# Patient Record
Sex: Female | Born: 1970
Health system: Southern US, Community
[De-identification: ages and names within clinical notes are randomized; demographics above are authoritative.]

## PROBLEM LIST (undated history)

## (undated) DIAGNOSIS — Z8679 Personal history of other diseases of the circulatory system: Secondary | ICD-10-CM

## (undated) DIAGNOSIS — B181 Chronic viral hepatitis B without delta-agent: Secondary | ICD-10-CM

## (undated) DIAGNOSIS — Z8709 Personal history of other diseases of the respiratory system: Secondary | ICD-10-CM

## (undated) DIAGNOSIS — S7292XA Unspecified fracture of left femur, initial encounter for closed fracture: Secondary | ICD-10-CM

## (undated) DIAGNOSIS — I351 Nonrheumatic aortic (valve) insufficiency: Secondary | ICD-10-CM

## (undated) HISTORY — DX: Personal history of other diseases of the circulatory system: Z86.79

## (undated) HISTORY — DX: Nonrheumatic aortic (valve) insufficiency: I35.1

## (undated) HISTORY — DX: Chronic viral hepatitis B without delta-agent: B18.1

## (undated) HISTORY — DX: Personal history of other diseases of the respiratory system: Z87.09

## (undated) HISTORY — DX: Unspecified fracture of left femur, initial encounter for closed fracture: S72.92XA

## (undated) HISTORY — PX: HEMORRHOID SURGERY: SHX153

---
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
## 1998-01-05 ENCOUNTER — Other Ambulatory Visit: Admission: RE | Admit: 1998-01-05 | Discharge: 1998-01-05 | Payer: Self-pay | Admitting: Obstetrics and Gynecology

## 1998-01-18 ENCOUNTER — Other Ambulatory Visit: Admission: RE | Admit: 1998-01-18 | Discharge: 1998-01-18 | Payer: Self-pay | Admitting: Obstetrics and Gynecology

## 1998-08-04 ENCOUNTER — Inpatient Hospital Stay (HOSPITAL_COMMUNITY): Admission: AD | Admit: 1998-08-04 | Discharge: 1998-08-06 | Payer: Self-pay | Admitting: *Deleted

## 1998-09-14 ENCOUNTER — Other Ambulatory Visit: Admission: RE | Admit: 1998-09-14 | Discharge: 1998-09-14 | Payer: Self-pay | Admitting: Obstetrics & Gynecology

## 1999-12-30 ENCOUNTER — Other Ambulatory Visit: Admission: RE | Admit: 1999-12-30 | Discharge: 1999-12-30 | Payer: Self-pay | Admitting: Obstetrics & Gynecology

## 2002-06-26 ENCOUNTER — Inpatient Hospital Stay (HOSPITAL_COMMUNITY): Admission: AD | Admit: 2002-06-26 | Discharge: 2002-06-28 | Payer: Self-pay | Admitting: Obstetrics and Gynecology

## 2002-06-26 ENCOUNTER — Inpatient Hospital Stay (HOSPITAL_COMMUNITY): Admission: AD | Admit: 2002-06-26 | Discharge: 2002-06-26 | Payer: Self-pay | Admitting: Obstetrics and Gynecology

## 2002-08-03 ENCOUNTER — Other Ambulatory Visit: Admission: RE | Admit: 2002-08-03 | Discharge: 2002-08-03 | Payer: Self-pay | Admitting: Obstetrics and Gynecology

## 2002-11-14 ENCOUNTER — Encounter (INDEPENDENT_AMBULATORY_CARE_PROVIDER_SITE_OTHER): Payer: Self-pay | Admitting: Specialist

## 2002-11-14 ENCOUNTER — Ambulatory Visit (HOSPITAL_BASED_OUTPATIENT_CLINIC_OR_DEPARTMENT_OTHER): Admission: RE | Admit: 2002-11-14 | Discharge: 2002-11-14 | Payer: Self-pay | Admitting: General Surgery

## 2002-11-15 ENCOUNTER — Emergency Department (HOSPITAL_COMMUNITY): Admission: EM | Admit: 2002-11-15 | Discharge: 2002-11-15 | Payer: Self-pay | Admitting: Emergency Medicine

## 2003-08-07 ENCOUNTER — Other Ambulatory Visit: Admission: RE | Admit: 2003-08-07 | Discharge: 2003-08-07 | Payer: Self-pay | Admitting: Obstetrics and Gynecology

## 2004-05-10 ENCOUNTER — Other Ambulatory Visit: Admission: RE | Admit: 2004-05-10 | Discharge: 2004-05-10 | Payer: Self-pay | Admitting: Obstetrics and Gynecology

## 2004-09-01 DIAGNOSIS — I351 Nonrheumatic aortic (valve) insufficiency: Secondary | ICD-10-CM

## 2004-09-01 HISTORY — DX: Nonrheumatic aortic (valve) insufficiency: I35.1

## 2004-11-15 ENCOUNTER — Inpatient Hospital Stay (HOSPITAL_BASED_OUTPATIENT_CLINIC_OR_DEPARTMENT_OTHER): Admission: RE | Admit: 2004-11-15 | Discharge: 2004-11-15 | Payer: Self-pay | Admitting: Cardiology

## 2004-11-15 HISTORY — PX: CARDIAC CATHETERIZATION: SHX172

## 2004-11-25 ENCOUNTER — Emergency Department (HOSPITAL_COMMUNITY): Admission: EM | Admit: 2004-11-25 | Discharge: 2004-11-26 | Payer: Self-pay | Admitting: Emergency Medicine

## 2004-11-27 ENCOUNTER — Ambulatory Visit (HOSPITAL_COMMUNITY): Admission: RE | Admit: 2004-11-27 | Discharge: 2004-11-27 | Payer: Self-pay | Admitting: Cardiology

## 2004-11-30 HISTORY — PX: AORTIC VALVE REPLACEMENT: SHX41

## 2004-12-20 ENCOUNTER — Encounter (INDEPENDENT_AMBULATORY_CARE_PROVIDER_SITE_OTHER): Payer: Self-pay | Admitting: *Deleted

## 2004-12-20 ENCOUNTER — Inpatient Hospital Stay (HOSPITAL_COMMUNITY): Admission: RE | Admit: 2004-12-20 | Discharge: 2004-12-25 | Payer: Self-pay | Admitting: Cardiothoracic Surgery

## 2005-05-22 ENCOUNTER — Encounter (HOSPITAL_COMMUNITY): Admission: RE | Admit: 2005-05-22 | Discharge: 2005-08-20 | Payer: Self-pay | Admitting: Cardiology

## 2005-11-20 IMAGING — CR DG CHEST 2V
2 series · 2 of 2 positions shown · non-contrast
Comparison: none

CLINICAL DATA: Pre-op for aortic valve replacement. 
 CHEST - 2 VIEW: 
 Two views of the chest are compared to a chest x-ray of 11/26/04.  
 The lungs are clear.   The heart is mildly enlarged and stable.  No bony abnormality is seen.

[view not recorded (1 of 2)]
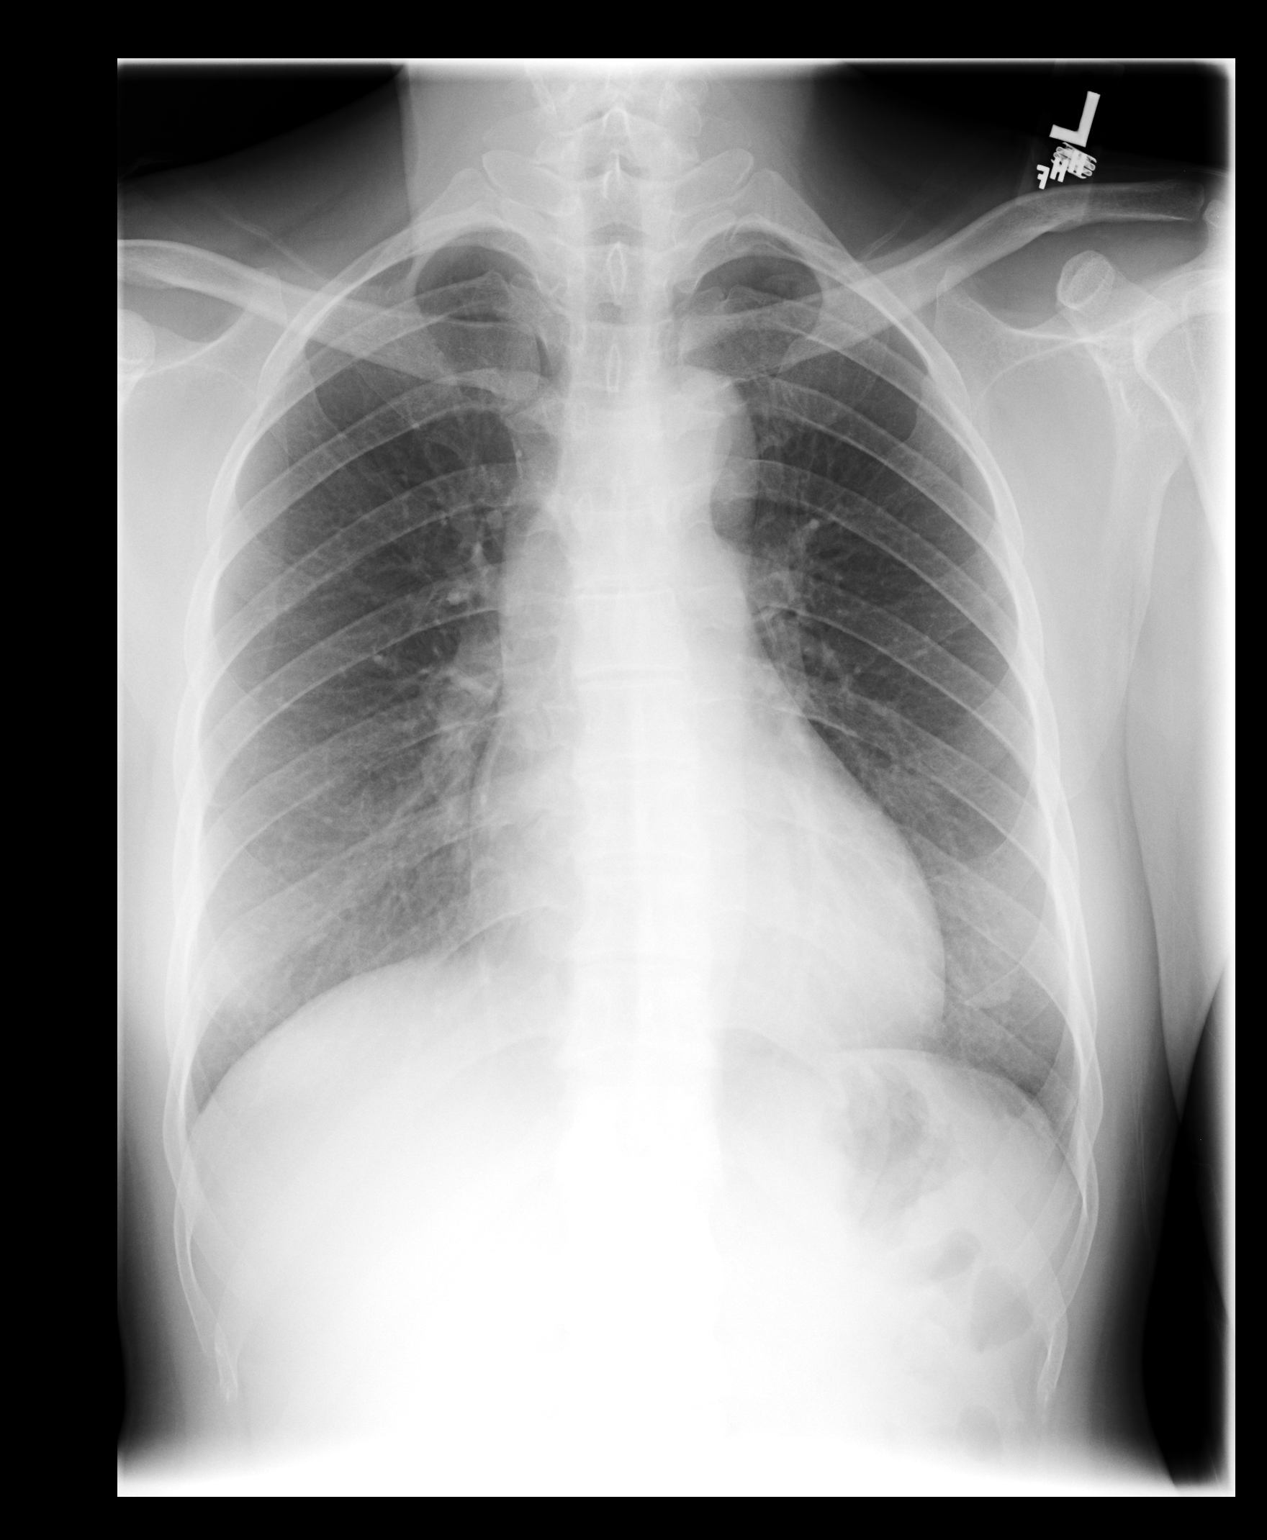

[view not recorded (2 of 2)]
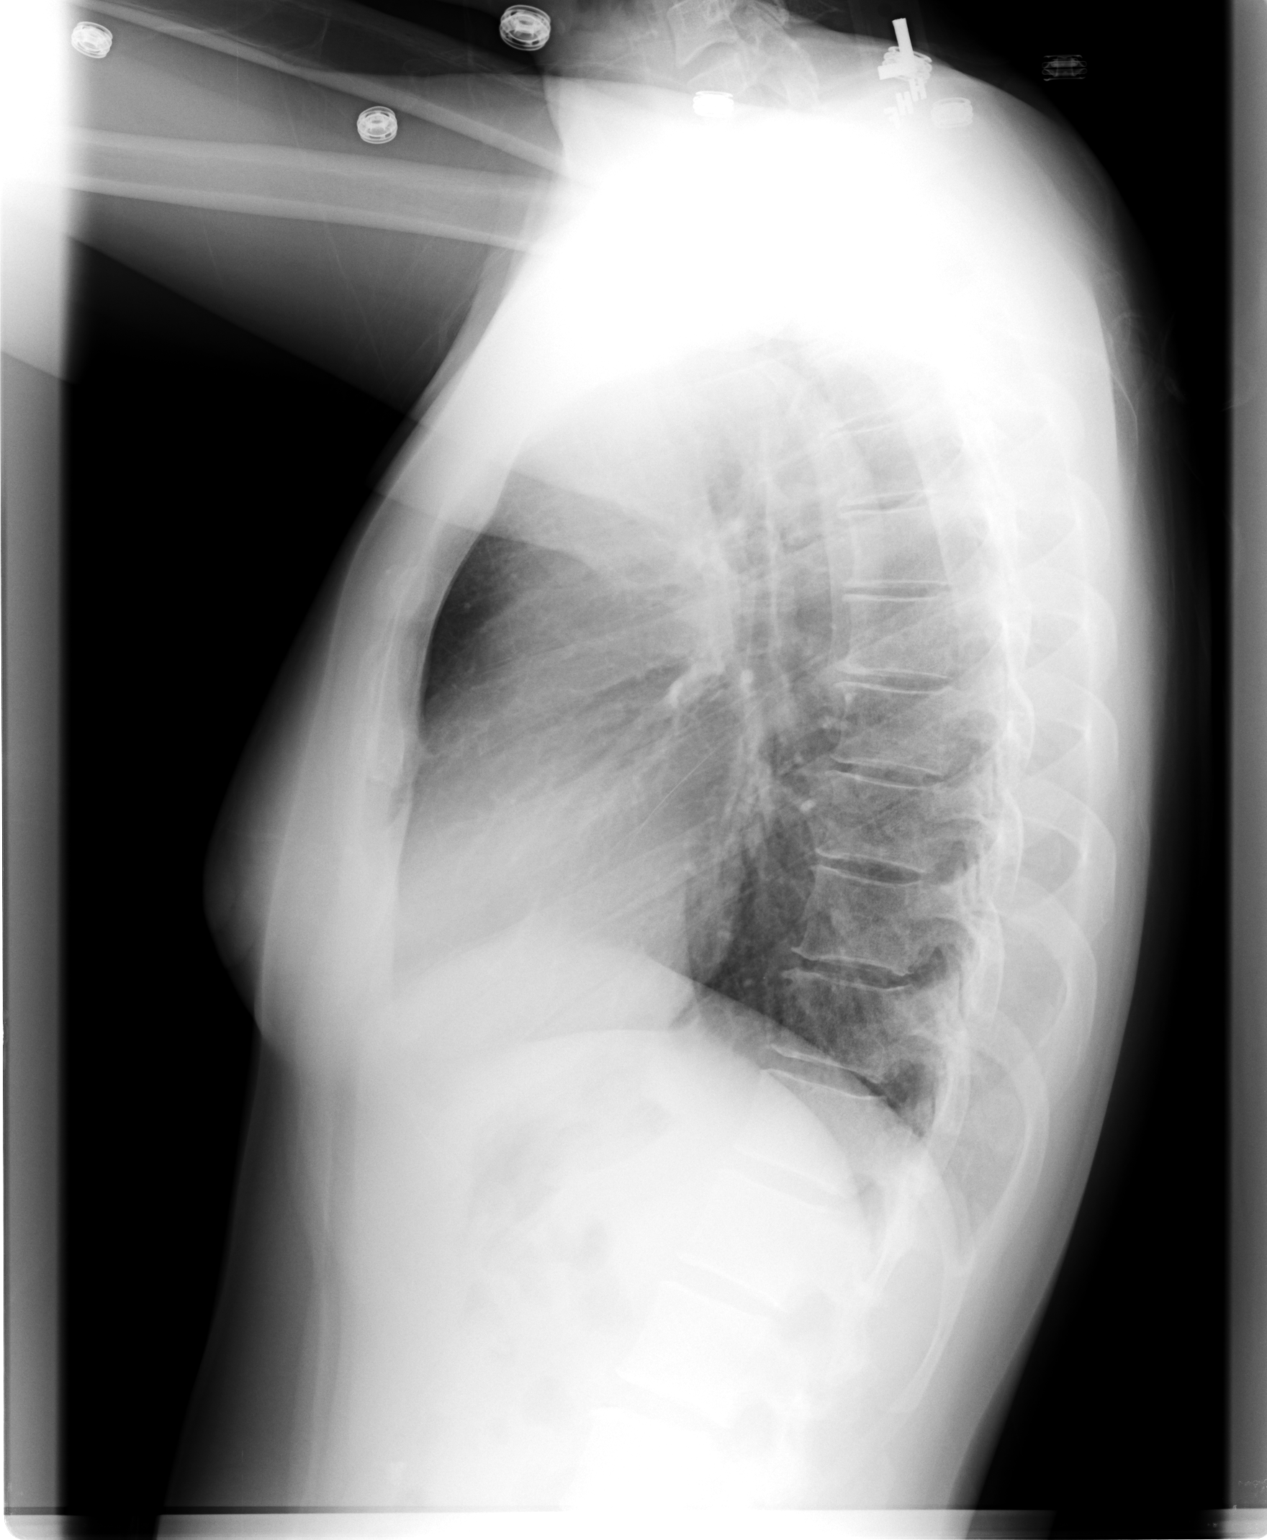

[2 of 2 positions shown; findings below may reference images not displayed]

IMPRESSION: No active lung disease.  Stable mild cardiomegaly.

## 2009-04-19 ENCOUNTER — Ambulatory Visit: Payer: Self-pay | Admitting: Gastroenterology

## 2009-05-15 ENCOUNTER — Ambulatory Visit: Payer: Self-pay | Admitting: Gastroenterology

## 2009-06-19 ENCOUNTER — Ambulatory Visit (HOSPITAL_COMMUNITY): Admission: RE | Admit: 2009-06-19 | Discharge: 2009-06-19 | Payer: Self-pay | Admitting: Gastroenterology

## 2009-09-13 ENCOUNTER — Ambulatory Visit: Payer: Self-pay | Admitting: Gastroenterology

## 2009-10-09 ENCOUNTER — Encounter: Payer: Self-pay | Admitting: Pulmonary Disease

## 2009-12-06 ENCOUNTER — Ambulatory Visit: Payer: Self-pay | Admitting: Gastroenterology

## 2009-12-20 ENCOUNTER — Encounter: Admission: RE | Admit: 2009-12-20 | Discharge: 2009-12-20 | Payer: Self-pay | Admitting: Cardiology

## 2009-12-26 ENCOUNTER — Encounter: Payer: Self-pay | Admitting: Pulmonary Disease

## 2010-01-09 ENCOUNTER — Encounter: Payer: Self-pay | Admitting: Pulmonary Disease

## 2010-01-14 ENCOUNTER — Encounter: Admission: RE | Admit: 2010-01-14 | Discharge: 2010-01-14 | Payer: Self-pay | Admitting: Family Medicine

## 2010-01-18 ENCOUNTER — Encounter: Admission: RE | Admit: 2010-01-18 | Discharge: 2010-01-18 | Payer: Self-pay | Admitting: Cardiology

## 2010-01-31 ENCOUNTER — Ambulatory Visit: Payer: Self-pay | Admitting: Pulmonary Disease

## 2010-01-31 DIAGNOSIS — R0602 Shortness of breath: Secondary | ICD-10-CM | POA: Insufficient documentation

## 2010-01-31 DIAGNOSIS — J984 Other disorders of lung: Secondary | ICD-10-CM | POA: Insufficient documentation

## 2010-02-20 ENCOUNTER — Ambulatory Visit: Payer: Self-pay | Admitting: Pulmonary Disease

## 2010-02-26 ENCOUNTER — Encounter: Payer: Self-pay | Admitting: Pulmonary Disease

## 2010-03-05 ENCOUNTER — Telehealth: Payer: Self-pay | Admitting: Pulmonary Disease

## 2010-03-21 ENCOUNTER — Ambulatory Visit: Payer: Self-pay | Admitting: Gastroenterology

## 2010-03-25 ENCOUNTER — Ambulatory Visit: Payer: Self-pay | Admitting: Pulmonary Disease

## 2010-04-09 ENCOUNTER — Ambulatory Visit: Payer: Self-pay | Admitting: Cardiology

## 2010-04-17 ENCOUNTER — Encounter: Payer: Self-pay | Admitting: Pulmonary Disease

## 2010-05-07 ENCOUNTER — Ambulatory Visit: Payer: Self-pay | Admitting: Cardiology

## 2010-05-28 ENCOUNTER — Ambulatory Visit: Payer: Self-pay | Admitting: Pulmonary Disease

## 2010-05-30 ENCOUNTER — Ambulatory Visit: Payer: Self-pay | Admitting: Cardiology

## 2010-06-04 ENCOUNTER — Ambulatory Visit (HOSPITAL_COMMUNITY): Admission: RE | Admit: 2010-06-04 | Discharge: 2010-06-04 | Payer: Self-pay | Admitting: Gastroenterology

## 2010-07-02 ENCOUNTER — Ambulatory Visit: Payer: Self-pay | Admitting: Cardiology

## 2010-07-04 ENCOUNTER — Ambulatory Visit: Payer: Self-pay | Admitting: Gastroenterology

## 2010-08-06 ENCOUNTER — Ambulatory Visit: Payer: Self-pay | Admitting: Cardiovascular Disease

## 2010-08-28 ENCOUNTER — Ambulatory Visit: Payer: Self-pay | Admitting: Cardiology

## 2010-09-26 ENCOUNTER — Ambulatory Visit: Payer: Self-pay | Admitting: Cardiology

## 2010-10-03 NOTE — Letter (Signed)
Summary: Bubble Study/[facility]  Bubble Study/[facility]   Imported By: Sherian Rein 06/03/2010 08:40:20  _____________________________________________________________________  External Attachment:    Type:   Image     Comment:   External Document

## 2010-10-03 NOTE — Assessment & Plan Note (Signed)
Summary: f/u ///kp   Visit Type:  Follow-up Copy to:  Dr. Warner Mccreedy, Dr. Peter [place], Dr. Sheliah Plane Primary Provider/Referring Provider:  Dr. Warner Mccreedy  CC:  Dyspnea follow-up...the patient says her breathing has improved.  History of Present Illness: 40 yo female with dyspnea.  Her breathing has been doing okay.  She will get winded if she walks up hills or goes above level 4 on her treadmill.  If she walks at a steady pace she has no problems.   She denies cough, wheeze, sputum, hemoptysis, chest pain, palpitations, fever, rashes, leg swelling, gland swelling, or abdominal symptoms.  Current Medications (verified): 1)  Coumadin 4 Mg Tabs (Warfarin Sodium) .... As Directed  Allergies (verified): No Known Drug Allergies  Past History:  Past Medical History: RPR positive Hepatitis B carrier Rheumatic fever Aortic insufficiency s/p valve replacement 2006 Pulmonary nodule Dyspnea      - Echo 10/09/09>>nl LV fx      - CT chest 01/18/10>>no PE, ? anterior thoracic aorta aneurysm      - Doppler legs 01/14/10>>no DVT      - PFT 02/20/10>>FEV1 2.70(95%), FEV1% 86, TLC 4.53(89%), DLCO 55%, no BD       - Transcranial doppler 04/17/10 negative for right to left shunt    Past Surgical History: Reviewed history from 01/31/2010 and no changes required. Left femur fracture 1991 Hemorrhoidectomy 2004 Cardiac catheterization      - Dr. Peter Swaziland March 2006 Aortic valve replacement with an On-X mechanical valve, serial #[id number], 21 mm.      - Dr. Sheliah Plane April 2006  Vital Signs:  Patient profile:   40 year old female Height:      64 inches (162.56 cm) Weight:      132 pounds (60.00 kg) BMI:     22.74 O2 Sat:      99 % on Room air Temp:     98.2 degrees F (36.78 degrees C) oral Pulse rate:   86 / minute BP sitting:   104 / 68  (left arm) Cuff size:   regular  Vitals Entered By: Michel Bickers CMA (May 28, 2010 2:52 PM)  O2 Sat at Rest %:   99 O2 Flow:  Room air CC: Dyspnea follow-up...the patient says her breathing has improved Comments Medications reviewed with patient Daytime phone verified. Michel Bickers [facility]  May 28, 2010 2:57 PM   Physical Exam  General:  normal appearance, healthy appearing, and thin.   Nose:  no deformity, discharge, inflammation, or lesions Mouth:  MP 3, triangular uvula, no exudate Neck:  no masses, thyromegaly, or abnormal cervical nodes Chest Wall:  no deformities noted Lungs:  clear bilaterally to auscultation and percussion Heart:  regular rhythm and normal rate with 3/6 systolic click Abdomen:  bowel sounds positive; abdomen soft and non-tender without masses, or organomegaly Msk:  no deformity or scoliosis noted with normal posture Pulses:  pulses normal Extremities:  no clubbing, cyanosis, edema, or deformity noted Neurologic:  normal CN II-XII and strength normal.   Skin:  intact without lesions or rashes Cervical Nodes:  no significant adenopathy   Impression & Recommendations:  Problem # 1:  DYSPNEA (ICD-786.05)  She has undergone an extension pulmonary evaluation.  Excluding isolated diffusion defect, her pulmonary evaluation has been unremarkable.  I have offered doing a cardiopulmonary stress test, but she does not feel her symptoms are too bad at present.  Will monitor clinically for now.  Advised her to call if she  develops any new symptoms.  Otherwise she is to follow up in 6 months.  Problem # 2:  PULMONARY NODULE (ICD-518.89)  Will need f/u CT chest in May 2012.  Complete Medication List: 1)  Coumadin 4 Mg Tabs (Warfarin sodium) .... As directed  Other Orders: Est. Patient Level III (04540)  Patient Instructions: 1)  Follow up in 6 months

## 2010-10-03 NOTE — Progress Notes (Signed)
  Phone Note Outgoing Call   Call placed by: Coralyn Helling MD,  March 05, 2010 11:33 AM Call placed to: Patient Summary of Call: Discussed results of PFT with pt.  Shows moderate diffusion defect, otherwise normal.  Had normal CT angiogram in May, and recent Echo unremarkable.  Will arrange for transcranial doppler with neurology to assess for shunt. Initial call taken by: Coralyn Helling MD,  March 05, 2010 11:34 AM

## 2010-10-03 NOTE — Procedures (Signed)
Summary: Spirometry  Spirometry   Imported By: Lester [place] 02/08/2010 08:10:52  _____________________________________________________________________  External Attachment:    Type:   Image     Comment:   External Document

## 2010-10-03 NOTE — Miscellaneous (Signed)
Summary: Orders Update-pft charges//jwr  Clinical Lists Changes  Orders: Added new Service order of Carbon Monoxide diffusing w/capacity (94720) - Signed Added new Service order of Lung Volumes (94240) - Signed Added new Service order of Spirometry (Pre & Post) (94060) - Signed 

## 2010-10-03 NOTE — Miscellaneous (Signed)
Summary: Pulmonary function test   Pulmonary Function Test Date: 02/20/2010 Height (in.): 64 Gender: Female  Pre-Spirometry FVC    Value: 2.96 L/min   Pred: 3.62 L/min     % Pred: 82 % FEV1    Value: 2.61 L     Pred: 2.84 L     % Pred: 92 % FEV1/FVC  Value: 88 %     Pred: 78 %     % Pred: . % FEF 25-75  Value: 3.21 L/min   Pred: 3.28 L/min     % Pred: 98 %  Post-Spirometry FVC    Value: 3.16 L/min   Pred: 3.62 L/min     % Pred: 87 % FEV1    Value: 2.70 L     Pred: 2.84 L     % Pred: 95 % FEV1/FVC  Value: 86 %     Pred: 78 %     % Pred: . % FEF 25-75  Value: 3.37 L/min   Pred: 3.28 L/min     % Pred: 103 %  Lung Volumes TLC    Value: 4.53 L   % Pred: 89 % RV    Value: 1.40 L   % Pred: 85 % DLCO    Value: 11.7 %   % Pred: 55 % DLCO/VA  Value: 3.21 %   % Pred: 75 %  Comments: Normal spirometry.  No bronchodilator response.  Normal lung volumes.  Moderate diffusion defect. Clinical Lists Changes  Observations: Added new observation of PFT COMMENTS: Normal spirometry.  No bronchodilator response.  Normal lung volumes.  Moderate diffusion defect. (02/20/2010 8:14) Added new observation of DLCO/VA%EXP: 75 % (02/20/2010 8:14) Added new observation of DLCO/VA: 3.21 % (02/20/2010 8:14) Added new observation of DLCO % EXPEC: 55 % (02/20/2010 8:14) Added new observation of DLCO: 11.7 % (02/20/2010 8:14) Added new observation of RV % EXPECT: 85 % (02/20/2010 8:14) Added new observation of RV: 1.40 L (02/20/2010 8:14) Added new observation of TLC % EXPECT: 89 % (02/20/2010 8:14) Added new observation of TLC: 4.53 L (02/20/2010 8:14) Added new observation of FEF2575%EXPS: 103 % (02/20/2010 8:14) Added new observation of PSTFEF25/75P: 3.28  (02/20/2010 8:14) Added new observation of PSTFEF25/75%: 3.37 L/min (02/20/2010 8:14) Added new observation of PSTFEV1/FCV%: . % (02/20/2010 8:14) Added new observation of FEV1FVCPRDPS: 78 % (02/20/2010 8:14) Added new observation of PSTFEV1/FVC: 86 %  (02/20/2010 8:14) Added new observation of POSTFEV1%PRD: 95 % (02/20/2010 8:14) Added new observation of FEV1PRDPST: 2.84 L (02/20/2010 8:14) Added new observation of POST FEV1: 2.70 L/min (02/20/2010 8:14) Added new observation of POST FVC%EXP: 87 % (02/20/2010 8:14) Added new observation of FVCPRDPST: 3.62 L/min (02/20/2010 8:14) Added new observation of POST FVC: 3.16 L (02/20/2010 8:14) Added new observation of FEF % EXPEC: 98 % (02/20/2010 8:14) Added new observation of FEF25-75%PRE: 3.28 L/min (02/20/2010 8:14) Added new observation of FEF 25-75%: 3.21 L/min (02/20/2010 8:14) Added new observation of FEV1/FVC%EXP: . % (02/20/2010 8:14) Added new observation of FEV1/FVC PRE: 78 % (02/20/2010 8:14) Added new observation of FEV1/FVC: 88 % (02/20/2010 8:14) Added new observation of FEV1 % EXP: 92 % (02/20/2010 8:14) Added new observation of FEV1 PREDICT: 2.84 L (02/20/2010 8:14) Added new observation of FEV1: 2.61 L (02/20/2010 8:14) Added new observation of FVC % EXPECT: 82 % (02/20/2010 8:14) Added new observation of FVC PREDICT: 3.62 L (02/20/2010 8:14) Added new observation of FVC: 2.96 L (02/20/2010 8:14) Added new observation of PFT HEIGHT: 64  (02/20/2010 8:14) Added new observation of  PFT DATE: 02/20/2010  (02/20/2010 8:14)

## 2010-10-03 NOTE — Assessment & Plan Note (Signed)
Summary: chronic sob/apc   Copy to:  Dr. Warner Mccreedy, Dr. Peter Swazil[place], Dr. Sheliah Plane Primary Provider/Referring Provider:  Dr. Warner Mccreedy  CC:  Pulmonary Consult for SOB.Marland Kitchen  History of Present Illness: 40 yo female for evaluation of dyspnea.  She has felt problems with her breathing for several years.  She has a history of rheumatic fever, and valvular heart disease.  She had aortic valve replacement in 2006.  This improved her symptoms some, but she continues to notice problems with her breathing.  She can usually walk on level ground w/o problem.  She will get winded going up stairs, but not to the point that she has to stop her activity.  She gets occasional chest discomfort, but denies palpitations.  She does not have a cough, sputum, hemoptysis, wheeze, or chest tightness.  She has no problems getting air into or out of her lungs.  She denies history of allergies or asthma.  She was tried on an inhaler, but did not notice any benefit from this.  Recent spirometry was normal.  She denies skin rash, leg swelling, or gland swelling.  Her husband used to smoke, but has since quit.  [name] has never smoked.  She is originally from [place], and moved to the [place] 18 years ago.  She works in a Customer service manager.  She has not had problems with her breathing from strong fumes.  There is no history of pneumonia or TB.  She denies recent travel or sick exposures.  She does not have any animal exposures.  She had recent Echo which was unremarkable.  She had recent CT chest and doppler of the legs which were negative for thrombo-embolic disease.  Recent lab work was reviewed and unremarkable.  CT of Chest  Procedure date:  01/18/2010  Findings:       Contrast:  100 ml Omnipaque 350    Comparison:  CT dated 11/27/2004    Findings:  The pulmonary arteries are patent.  No evidence for   pulmonary embolism.  There is flow in the great vessels.   Incidentally, the left vertebral  artery originates from the aortic   arch which is a normal variant.  Patient has undergone an aortic   valve replacement since the previous examination.  The ascending   thoracic aorta measures up to 3.4 cm and previously measured 3.9   cm.  There is an unexpected density just anterior to the ascending   thoracic aorta.  These densities are best seen on sequence 3,   image 85.  Finding raises concern for a small penetrating ulcer or   small pseudoaneurysm along the anterior thoracic aorta.  This   possible penetrating ulcer measures 0.5 cm in the AP dimension and   0.8 cm in width.  There is no significant hematoma surrounding this   finding.  In addition, there is no gross retrosternal abnormality.   Please note that there is a median sternotomy wire at the same   level as the suspicious aortic finding.    There is no significant pericardial or pleural fluid.  Limited   evaluation of the upper abdominal structures.  Trachea and mainstem   bronchi are patent.  Mild atelectasis at the right lung base.   There is a stable 4 mm density along the right minor fissure which   is likely benign due to the stability.  3 mm nodular density in the   right upper lobe on sequence 7, image 49.  This density was not   clearly present on the previous examination.  Stable punctate   pleural-based densities in the left lung base.  No acute bony   abnormality.    Review of the MIP images confirms the above findings.  Comments:       IMPRESSION:   Study is negative for pulmonary embolism.    Concern for a penetrating ulcer or a small pseudoaneurysm along the   anterior thoracic aorta.  This area would be better evaluated with   a pre and post contrast CTA of the aorta.  Please note that this   high density structure anterior to the aorta could be related to   calcifications rather than contrast  Cardiac Cath  Procedure date:  11/15/2004  Findings:      HEMODYNAMIC DATA:  Right heart data:  Right  atrial pressures 5/4 with mean  of 1 mmHg.  Right ventricular pressures 20 with EDP of 3 mmHg.  Pulmonary  artery pressure 17/7 with a mean of 11 mmHg.  Pulmonary capillary wedge  pressure is 9/10 with a mean of 6 mmHg.  Aortic pressure was 129/57 with a  mean of 87 mmHg.  Left ventricle pressure was 131 with EDP of 9 mmHg.  There  is no significant aortic or mitral valve gradient.  Saturations in the right  atrium is 64% and 67% in the pulmonary artery and 95% saturation for aortic.  Cardiac output by thermodilution is 3.7 L per minute with an index of 2.34.  Cardiac outputs 4.1 with an index of 2.59.   ANGIOGRAPHIC DATA:  The left ventricular angiography was performed in RAO  view.  This demonstrates mild left ventricular dilatation with normal  systolic function.  Ejection fraction is estimated 55%.  There is no mitral  insufficiency noted.   The aortic root angiography demonstrates probable aortic valve.  There is  mild aortic root dilatation.  There is severe aortic insufficiency gradient  4+.   Coronary angiography:  The left coronary rises and distributes normally.  Left main coronary is normal.   Left anterior descending artery and its vessels are normal.   Left circumflex coronary is normal.   The right coronary arises and distributes normally and is a normal vessel.   FINAL INTERPRETATION:  1.  Severe aortic insufficiency.  2.  Normal left ventricular systolic function.  3.  Normal coronary anatomy.  4.  Normal right heart pressures.  Venous Doppler  Procedure date:  01/14/2010  Findings:       BILATERAL LOWER EXTREMITY VENOUS DUPLEX ULTRASOUND    Technique: Gray-scale sonography with compression, as well as color   and duplex ultrasound, were performed to evaluate the deep venous   system from the level of the common femoral vein through the   popliteal and proximal calf veins.    Comparison: None    Findings:  Normal compressibility and normal Doppler  signal within   the common femoral, superficial femoral and popliteal veins, down   to the proximal calf veins.  No grayscale filling defects to   suggest DVT.    IMPRESSION:   No evidence of bilateral lower extremity deep vein thrombosis.   Echocardiogram  Procedure date:  10/09/2009  Comments:      Normal LV size and function.  Mechanical aortic valve prosthesis, functioning well.  Mild mitral insufficiency.  -  Date:  12/26/2009    FVC: 3.17    FVC % EXPECT: 96    FEV1: 2.49  FEV1 % EXP: 89    FEF 25-75% 2.44    FEF %Expect 75   Preventive Screening-Counseling & Management  Alcohol-Tobacco     Smoking Status: never  Current Medications (verified): 1)  Coumadin 4 Mg Tabs (Warfarin Sodium) .... As Directed 2)  Benadryl 25 Mg Caps (Diphenhydramine Hcl) .... At Bedtime As Needed 3)  Melatonin ?mg .... At Bedtime As Needed  Allergies (verified): No Known Drug Allergies  Past History:  Past Medical History: RPR positive Hepatitis B carrier Rheumatic fever Aortic insufficiency s/p valve replacement 2006 Pulmonary nodule    Past Surgical History: Left femur fracture 1991 Hemorrhoidectomy 2004 Cardiac catheterization      - Dr. Peter Swaziland March 2006 Aortic valve replacement with an On-X mechanical valve, serial #[id number], 21 mm.      - Dr. Sheliah Plane April 2006  Family History: heart disease-father  Social History: Patient never smoked.  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  The patient denies productive cough, non-productive cough, coughing up blood, irregular heartbeats, acid heartburn, indigestion, loss of appetite, weight change, abdominal pain, difficulty swallowing, sore throat, tooth/dental problems, headaches, nasal congestion/difficulty breathing through nose, sneezing, itching, ear  ache, anxiety, depression, hand/feet swelling, joint stiffness or pain, rash, change in color of mucus, and fever.    Vital Signs:  Patient profile:   40 year old female Height:      64 inches Weight:      129 pounds BMI:     22.22 O2 Sat:      98 % on Room air Temp:     97.9 degrees F oral Pulse rate:   82 / minute BP sitting:   98 / 60  (left arm) Cuff size:   regular  Vitals Entered By: Gweneth Dimitri RN (January 31, 2010 9:11 AM)  O2 Flow:  Room air CC: Pulmonary Consult for SOB. Comments Medications reviewed with patient Daytime contact number verified with patient. Gweneth Dimitri RN  January 31, 2010 9:12 AM    Physical Exam  General:  normal appearance, healthy appearing, and thin.   Eyes:  PERRLA and EOMI.   Nose:  no deformity, discharge, inflammation, or lesions Mouth:  MP 3, triangular uvula, no exudate Neck:  no masses, thyromegaly, or abnormal cervical nodes Chest Wall:  no deformities noted Lungs:  clear bilaterally to auscultation and percussion Heart:  regular rhythm and normal rate with 3/6 systolic click Abdomen:  bowel sounds positive; abdomen soft and non-tender without masses, or organomegaly Msk:  no deformity or scoliosis noted with normal posture Pulses:  pulses normal Extremities:  no clubbing, cyanosis, edema, or deformity noted Neurologic:  CN II-XII grossly intact with normal reflexes, coordination, muscle strength and tone Cervical Nodes:  no significant adenopathy Psych:  alert and cooperative; normal mood and affect; normal attention span and concentration   Impression & Recommendations:  Problem # 1:  DYSPNEA (ICD-786.05) I do not have a good explaination for her dyspnea.    Her recent spirometry was normal.  She does not have a history of tobacco use, and does not have symptoms suggestive of asthma or obstructive lung disease.  In addition, she did not notice any benefit from trial of inhaler therapy.  She does have a history of valvular  heart disease and is s/p valve replacement.  Her recent cardiac evaluation has been relatively normal.  She is being followed by Dr. Swaziland from cardiology, and Dr. Tyrone Sage from cardiovascular surgery.  Of note it that she had severe pulmonary hypertension prior to valve replacement, but her recent Echo showed normal pulmonary artery pressure.  I do not think that she would need right heart catheterization at this time.  She is to follow up with Dr. 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  She did have an elevated D-dimer, but her doppler leg study and CT angiogram were negative for thrombo-embolic disease.  She is also on chronic anticoagulation with coumadin for her valve.  She could have a component of deconditioning.  There was no evidence for muscle weakness on exam, and nothing to suggest a neurologic dysfunction that could affect her respiratory muscle activity.  I will arrange for full pulmonary function testing.  If this is unrevealing, then I will arrange for cardiopulmonary exercise testing to further assess the cause of her dyspnea.  Problem # 2:  PULMONARY NODULE (ICD-518.89) She will need follow up CT chest in one year (May 2012).  Medications Added to Medication List This Visit: 1)  Coumadin 4 Mg Tabs (Warfarin sodium) .... As directed 2)  Benadryl 25 Mg Caps (Diphenhydramine hcl) .... At bedtime as needed 3)  Melatonin ?mg  .... At bedtime as needed  Complete Medication List: 1)  Coumadin 4 Mg Tabs (Warfarin sodium) .... As directed 2)  Benadryl 25 Mg Caps (Diphenhydramine hcl) .... At bedtime as needed 3)  Melatonin ?mg  .... At bedtime as needed  Other Orders: Consultation Level IV (04540) Full Pulmonary Function Test (PFT)  Patient Instructions: 1)  Will schedule breathing test (PFT), and will call you once results are available 2)  Follow up in 2 months   Immunization History:  Influenza Immunization History:    Influenza:  historical  (09/01/2009)

## 2010-10-03 NOTE — Assessment & Plan Note (Signed)
Summary: 2 months/apc   Visit Type:  Follow-up Copy to:  Dr. Warner Mccreedy, Dr. Peter [place], Dr. Sheliah Plane Primary Provider/Referring Provider:  Dr. Warner Mccreedy  CC:  Dyspnea follow-up.  The patient says her breathing has improved.Marland Kitchen  History of Present Illness: 40 yo female with dyspnea.  She has not had her transcranial doppler done yet.  This is scheduled for August.  Current Medications (verified): 1)  Coumadin 4 Mg Tabs (Warfarin Sodium) .... As Directed  Allergies (verified): No Known Drug Allergies  Past History:  Past Medical History: RPR positive Hepatitis B carrier Rheumatic fever Aortic insufficiency s/p valve replacement 2006 Pulmonary nodule Dyspnea      - Echo 10/09/09>>nl LV fx      - CT chest 01/18/10>>no PE, ? anterior thoracic aorta aneurysm      - Doppler legs 01/14/10>>no DVT      - PFT 02/20/10>>FEV1 2.70(95%), FEV1% 86, TLC 4.53(89%), DLCO 55%, no BD     Past Surgical History: Reviewed history from 01/31/2010 and no changes required. Left femur fracture 1991 Hemorrhoidectomy 2004 Cardiac catheterization      - Dr. Peter Swaziland March 2006 Aortic valve replacement with an On-X mechanical valve, serial #[id number], 21 mm.      - Dr. Sheliah Plane April 2006  Vital Signs:  Patient profile:   40 year old female Height:      64 inches (162.56 cm) Weight:      129 pounds (58.64 kg) BMI:     22.22 O2 Sat:      100 % on Room air Temp:     98.2 degrees F (36.78 degrees C) oral Pulse rate:   77 / minute BP sitting:   112 / 72  (right arm) Cuff size:   regular  Vitals Entered By: Michel Bickers CMA (March 25, 2010 9:06 AM)  O2 Sat at Rest %:  100 O2 Flow:  Room air CC: Dyspnea follow-up.  The patient says her breathing has improved. Comments Medications reviewed with the patient. Daytime phone verified. Michel Bickers [facility]  March 25, 2010 9:07 AM    Impression & Recommendations:  Problem # 1:  DYSPNEA (ICD-786.05) She has moderate  diffusion defect on PFT.  The remainder of her evaluation has been negative to date.  She is schedule for transcranial doppler, but this has not been done yet.  Will cancel todays appointment, credit her copay to her next visit, and then follow up after her transcranial doppler study is done.  Problem # 2:  PULMONARY NODULE (ICD-518.89)  She will need follow up CT chest in one year (May 2012).  Complete Medication List: 1)  Coumadin 4 Mg Tabs (Warfarin sodium) .... As directed  Other Orders: No Charge Patient Arrived (NCPA0) (NCPA0)  Patient Instructions: 1)  You have a moderate diffusion capacity defect on your pulmonary function test (breathing test).  This means you may have a problem with getting oxygen across your lungs into your circulation.  You have been scheduled for a transcranial doppler ultrasound (bubble study) to check whether you have an abnormal circulation pattern which could be causing your breathing problems. 2)  Follow up in August after transcranial doppler ultrasound performed.

## 2010-10-10 ENCOUNTER — Ambulatory Visit: Payer: Self-pay | Admitting: Gastroenterology

## 2010-10-22 ENCOUNTER — Other Ambulatory Visit: Payer: Self-pay

## 2010-10-29 ENCOUNTER — Encounter (INDEPENDENT_AMBULATORY_CARE_PROVIDER_SITE_OTHER): Payer: Self-pay

## 2010-10-29 DIAGNOSIS — I359 Nonrheumatic aortic valve disorder, unspecified: Secondary | ICD-10-CM

## 2010-10-29 DIAGNOSIS — Z7901 Long term (current) use of anticoagulants: Secondary | ICD-10-CM

## 2010-11-14 ENCOUNTER — Ambulatory Visit (INDEPENDENT_AMBULATORY_CARE_PROVIDER_SITE_OTHER): Payer: BC Managed Care – PPO | Admitting: Gastroenterology

## 2010-11-14 DIAGNOSIS — B181 Chronic viral hepatitis B without delta-agent: Secondary | ICD-10-CM

## 2010-11-18 ENCOUNTER — Ambulatory Visit (INDEPENDENT_AMBULATORY_CARE_PROVIDER_SITE_OTHER): Payer: BC Managed Care – PPO | Admitting: Cardiology

## 2010-11-18 DIAGNOSIS — R0602 Shortness of breath: Secondary | ICD-10-CM

## 2010-11-18 DIAGNOSIS — I359 Nonrheumatic aortic valve disorder, unspecified: Secondary | ICD-10-CM

## 2010-11-18 DIAGNOSIS — Z7901 Long term (current) use of anticoagulants: Secondary | ICD-10-CM

## 2011-01-07 ENCOUNTER — Other Ambulatory Visit: Payer: Self-pay | Admitting: *Deleted

## 2011-01-07 DIAGNOSIS — I4891 Unspecified atrial fibrillation: Secondary | ICD-10-CM

## 2011-01-08 ENCOUNTER — Ambulatory Visit (INDEPENDENT_AMBULATORY_CARE_PROVIDER_SITE_OTHER): Payer: BC Managed Care – PPO | Admitting: *Deleted

## 2011-01-08 DIAGNOSIS — I4891 Unspecified atrial fibrillation: Secondary | ICD-10-CM

## 2011-01-08 LAB — POCT INR: INR: 3.8

## 2011-01-17 NOTE — Consult Note (Signed)
[name], PHO NO.:  [id number]   MEDICAL RECORD NO.:  [id number]           PATIENT TYPE:   LOCATION:  CATS                         FACILITY:  [facility]   PHYSICIAN:  Sheliah Plane, MD    DATE OF BIRTH:  1970-10-19   DATE OF CONSULTATION:  12/05/2004  DATE OF DISCHARGE:                                   CONSULTATION   CONSULTATION/HISTORY AND PHYSICAL   REQUESTING PHYSICIAN:  Dr. Swaziland.   FOLLOWUP CARDIOLOGIST:  Dr. Swaziland.   PRIMARY CARE PHYSICIAN:  [facility].   REASON FOR CONSULTATION:  Aortic insufficiency.   HISTORY OF PRESENT ILLNESS:  The patient is a 40 year old [place] woman  who has had known aortic insufficiency since she moved to this country 13  years ago.  She has seen been by Dr. Swaziland in the past who has done several  echocardiograms.  Because of increasing shortness of breath especially with  exertion, recently the patient had a repeat echocardiogram done by Dr.  Swaziland.  It was noted that she had increase in severity of the aortic  insufficiency which is now severe and had suggestions of elevations of her  pulmonary pressures.  Cardiac catheterization was performed which  demonstrated normal pulmonary artery pressures and cardiac output.  She had  4+ aortic insufficiency and some mild aortic root dilatation.  The left  ventricular function was normal and coronary artery anatomy was normal.  Since the catheterization, she has continued to have some increasing  shortness of breath to the point where a CT scan of the chest to rule out  pulmonary embolism was performed without any obvious abnormality.  She has  no history of hypertension, hyperlipidemia or diabetes.  She does not smoke.  She has had no previous stroke.   FAMILY HISTORY:  She has one brother with an enlarged heart, otherwise no  cardiac abnormalities in history.   PAST SURGICAL HISTORY:  Hemorrhoidectomy.   PAST MEDICAL HISTORY:  She has had three children.   Husband has had a  vasectomy and she desires no more children.   SOCIAL HISTORY:  The patient is married.  As noted, her husband has had a  vasectomy.  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   CURRENT MEDICATIONS:  1.  Lasix 20 mg a day.  2.  KCl 10 mEq a day.   ALLERGIES:  She denies any allergies.   CARDIAC REVIEW OF SYSTEMS:  She has had some vague chest discomfort  following her catheterization.  Denies resting shortness of breath,  exertional shortness of breath, orthopnea, syncope, palpitations, lower  extremity edema.  She did have exertional shortness of breath earlier after  the catheterization and was seen in the emergency room on one occasion, but  since then this has resolved.   GENERAL REVIEW OF SYSTEMS:  CONSTITUTIONAL:  Denies.  Denies fevers, chills.  RESPIRATORY:  Denies hemoptysis.  GASTROINTESTINAL:  She has had blood per  rectum in the past.  This was evaluated and she was found to have bleeding  hemorrhoids and underwent a hemorrhoidectomy  several years ago.  Since then  she has had no further bleeding.  NEUROLOGICAL:  Denies transient ischemic  attacks or amaurosis.  MUSCULOSKELETAL:  Denies.  UROLOGIC:  Denies any  hematuria.  Denies any recent infections.  ENDOCRINE:  Denies diabetes.  PSYCHIATRIC:  Denies psychiatric history.  GENITOURINARY:  The patient is premenopausal, but as noted above, her  husband has had vasectomy.   PHYSICAL EXAMINATION:  NEUROLOGICAL:  The patient is awake, alert,  neurologically intact on examination.  VITAL SIGNS:  Blood pressure is 100/58, pulse 72, respiratory rate 16.  HEENT:  Pupils equal, round and reactive to light.  She has intact teeth  with the exception of two which had been pulled out in the past.  She sees  the dentist on a regular basis.  NECK:  Without carotid bruits.  CHEST:  A 3/6 diastolic murmur consistent with aortic insufficiency.  ABDOMEN:  Examination is benign without palpable masses.   EXTREMITIES:  Without edema.  She has 2+ DP and PT pulses.   STUDIES:  Cardiac catheterization films are reviewed.  She has normal  coronary arteries, 3-4+ aortic insufficiency, normal right heart pressures.  Mild aortic root dilatation.   IMPRESSION:  Patient with progressive aortic insufficiency with varying  symptoms which has been in the past asymptomatic with evidence of worsening  degree of insufficiency on serial echocardiograms.  Agree with Dr. Elvis Coil  recommendation to proceed with aortic valve replacement.  I have discussed  this with the patient.  The risks of surgery including death, infection,  stroke, myocardial infarction, bleeding, blood transfusion have all been  discussed in detail.  We also discussed with the patient types of valves and  risks and options of each one.  Use of long-term anticoagulation with  mechanical valve was discussed and also considering her age the importance  of not to become pregnant on Coumadin.  After discussion of various valves,  the patient is agreeable with mechanical valve and has no definite  contraindication to taking Coumadin.  We tentatively arranged for surgery on  4/21.  This is agreeable with the patient.      EG/MEDQ  D:  12/05/2004  T:  12/05/2004  Job:  [id number]   cc:   Peter M. Swaziland, M.D.  [address], [address]  [place], [place] [address]  Fax: [phone]

## 2011-01-17 NOTE — Discharge Summary (Signed)
[name], [name]                   ACCOUNT NO.:  [id number]   MEDICAL RECORD NO.:  [id number]          PATIENT TYPE:  INP   LOCATION:  2025                         FACILITY:  [facility]   PHYSICIAN:  Sheliah Plane, MD    DATE OF BIRTH:  April 10, 1971   DATE OF ADMISSION:  12/20/2004  DATE OF DISCHARGE:  12/25/2004                                 DISCHARGE SUMMARY   ADMISSION DIAGNOSIS:  Severe aortic insufficiency.   DISCHARGE/SECONDARY DIAGNOSES:  1.  Severe aortic insufficiency status post aortic valve replacement.  2.  History of hemorrhoidectomy.  3.  History of left femur fracture with open reduction and internal fixation      in [place] in 1990.  4.  NO KNOWN DRUG ALLERGIES.   PROCEDURES:  On December 20, 2004 aortic valve replacement with using an On-X  mechanical valve serial [id number], 21 millimeter Dr. Sheliah Plane.   BRIEF HISTORY:  Ms. [name] is a 40 year old [place] woman who has had  known aortic insufficiency since she moved to the [place] 13 years  ago.  She has been followed by Dr. Peter Swaziland in the past who has done  several echocardiograms.  Because of her increasing shortness of breath  especially with exertion, the patient had a repeat echocardiogram which  showed an increase in severity of the aortic insufficiency which were now  severe and had suggestions of elevations of her pulmonary  pressures.  Cardiac catheterization was performed which demonstrated normal pulmonary  artery pressures and cardiac output.  She had 4+ aortic insufficiency with  some aortic root dilatation.  Ventricular function was normal and coronary  artery anatomy was normal.  Since catheterization she has continued to have  increasing shortness of breath to the point that a CT scan was ordered to  rule out pulmonary embolism.  This showed no obvious abnormality.  Based on  her symptomatic severe aortic insufficiency she was referred to Dr. Sheliah Plane who recommended proceeding  with aortic valve replacement.  Dr.  Tyrone Sage discussed types of valves and risks and options of each one.  After  his discussion, the patient was agreeable to mechanical valve and had no  definite contraindication for taking Coumadin.   HOSPITAL COURSE:  [name] was electively admitted to [facility]  on December 20, 2004 for aortic valve replacement.  Overall she was felt to  tolerate the procedure well and postoperatively was transferred to the  surgical intensive care unit hemodynamically stable.  By postoperative day  one [name] had been extubated and was neurologically intact.  She  remained stable and in sinus rhythm.  She did report postoperative nausea,  but was otherwise without concerns.  Her labs did show mild acute blood loss  anemia, but she did not require a blood transfusion.  She also had evidence  of volume excess and was started on short term diuresis.  By postoperative  day two, [name]'s chest tubes and lines had been discontinued without  incident.  She still had some nausea overnight, but was tolerating liquids.  Her abdominal  exam was benign.  Mobilization was encouraged and she was  continued on a clear liquid diet.  She was also started on Coumadin therapy  for her mechanical valve.  By postoperative day three [name]'s nausea had  resolved.  She was also felt stable to transfer out to the floor.  Once on  Unit 2000 she made good progress.  Her pain was controlled on oral  medication.  Her bowel or bladder were functioning appropriately.  She had  been ambulating in the hallways with cardiac rehab and was noted to have  steady gait.  She was diuresing well and diuretic therapy was discontinued.  By postoperative day four [name] was felt near ready for discharge.  She  remained hemodynamically stable and afebrile.  Blood pressure was 104/75,  heart rate in the 80s and in sinus rhythm and oxygen saturation 98% on room  air.  Her weight was now just  above her baseline, the extremities showed no  edema.  She did report some soreness in her chest and supraclavicular region  which was felt most likely secondary to positioning during surgery.  Overall  this was controlled with oral medication.  On exam her heart had a regular  rate and rhythm with good crisp valve.  Her lung sounds were clear and her  incisions were healing well without signs of infection.  By this time her  INR had also jumped to 2.1.  It was felt that if her INR remained stable for  the next 24 hours and there was no significant changes in her status, that  she would be ready for discharge home on postoperative day five, December 25, 2004.   LABORATORY DATA:  Recent labs show a white blood cell count of 10.8,  hemoglobin 9.6, hematocrit 27.8, platelet count 162, sodium 137, potassium  4.0, chloride 105, CO2 27, BUN 7, creatinine 0.5, blood glucose 101, SGOT  18, SGPT 19, alkaline phosphatase 40, total bilirubin 0.4, albumin 4.0, PT  19.6, INR 2.1.  Radiology -- chest x-ray on December 23, 2004 showed persistent  atelectasis in the left lower lobe without significant change.  There was  minimal right lower lobe atelectasis.  Mild cardiomegaly was stable.  There  was no evidence of congestive heart failure.   DISCHARGE MEDICATIONS:  1.  Aspirin 81 mg 1 p.o. daily  2.  Toprol XL 25 mg 1 p.o. daily  3.  Coumadin.  Initial dose to be determined at the time of discharge      pending the patients INR.  4.  Tylox 1-2 tablets p.o. q.4-6 hours p.r.n. pain.   DISCHARGE INSTRUCTIONS:  She was instructed to avoid driving or heavy  lifting more than 10 pounds.  She is to continue daily walking and breathing  exercises.  She is to follow a low fat, low salt diet.  She may shower and  clean her incisions gently with mild soap and water.  She should notify the  [facility] office if she develops fever, redness or drainage from her incision site, increasing shortness of breath or persistent  increase in her weight.   FOLLOW UP:  1.  She is to follow-up to Dr. Sheliah Plane at the CVTS office on Jan 23, 2005 at 12:50 p.m.  2.  She is to call and schedule two week followup with Dr. Peter Swaziland.      She is to have a chest x-ray at this appointment and was instructed  to      bring this chest x-ray with her to her appointment with Dr. Tyrone Sage.  3.  She is to obtain a PT/INR lab work at Dr. Elvis Coil office on December 27, 2004.      AWZ/MEDQ  D:  12/24/2004  T:  12/24/2004  Job:  [id number]   cc:   Peter M. Swaziland, M.D.  [address], [address]  [place], [place] [address]  Fax: [phone]

## 2011-01-17 NOTE — Cardiovascular Report (Signed)
[name], [name] NO.:  [id number]   MEDICAL RECORD NO.:  [id number]          PATIENT TYPE:  OIB   LOCATION:  6501                         FACILITY:  [facility]   PHYSICIAN:  Peter M. Swaziland, M.D.  DATE OF BIRTH:  [birth year]   DATE OF PROCEDURE:  11/15/2004  DATE OF DISCHARGE:                              CARDIAC CATHETERIZATION   INDICATIONS FOR PROCEDURE:  The patient is 40 year old, [place] female with  history of chronic aortic insufficiency.  Recent echocardiogram indicated  worsening aortic insufficiency and elevation of pulmonary pressures.   PROCEDURES:  1.  Right and left heart catheterization.  2.  Coronary and left ventricular angiography.  3.  Aortic root angiography.   EQUIPMENT:  A 7-French venous sheath, 7-French balloon tip Swan-Ganz  catheter, 6-French arterial sheath, 5-French 4 cm right and left Judkins  catheter and 5-French pigtail catheter.   MEDICATIONS:  Local anesthesia 1% Xylocaine.   CONTRAST:  150 cc of Omnipaque.   HEMODYNAMIC DATA:  Right heart data:  Right atrial pressures 5/4 with mean  of 1 mmHg.  Right ventricular pressures 20 with EDP of 3 mmHg.  Pulmonary  artery pressure 17/7 with a mean of 11 mmHg.  Pulmonary capillary wedge  pressure is 9/10 with a mean of 6 mmHg.  Aortic pressure was 129/57 with a  mean of 87 mmHg.  Left ventricle pressure was 131 with EDP of 9 mmHg.  There  is no significant aortic or mitral valve gradient.  Saturations in the right  atrium is 64% and 67% in the pulmonary artery and 95% saturation for aortic.  Cardiac output by thermodilution is 3.7 L per minute with an index of 2.34.  Cardiac outputs 4.1 with an index of 2.59.   ANGIOGRAPHIC DATA:  The left ventricular angiography was performed in RAO  view.  This demonstrates mild left ventricular dilatation with normal  systolic function.  Ejection fraction is estimated 55%.  There is no mitral  insufficiency noted.   The aortic root  angiography demonstrates probable aortic valve.  There is  mild aortic root dilatation.  There is severe aortic insufficiency gradient  4+.   Coronary angiography:  The left coronary rises and distributes normally.  Left main coronary is normal.   Left anterior descending artery and its vessels are normal.   Left circumflex coronary is normal.   The right coronary arises and distributes normally and is a normal vessel.   FINAL INTERPRETATION:  1.  Severe aortic insufficiency.  2.  Normal left ventricular systolic function.  3.  Normal coronary anatomy.  4.  Normal right heart pressures.      PMJ/MEDQ  D:  11/15/2004  T:  11/15/2004  Job:  [id number]

## 2011-01-17 NOTE — Op Note (Signed)
NAMEHIBO, BLASDELL                             ACCOUNT NO.:  [id number]   MEDICAL RECORD NO.:  [id number]                   PATIENT TYPE:  AMB   LOCATION:  DSC                                  FACILITY:  [facility]   PHYSICIAN:  Gita Kudo, M.D.              DATE OF BIRTH:  1970/12/09   DATE OF PROCEDURE:  11/14/2002  DATE OF DISCHARGE:                                 OPERATIVE REPORT   OPERATIVE PROCEDURE:  Anoscopy, hemorrhoidectomy.   SURGEON:  Gita Kudo, M.D.   ANESTHESIA:  General.   PREOPERATIVE DIAGNOSIS:  Hemorrhoids -- mixed internal and external.   POSTOPERATIVE DIAGNOSIS:  Hemorrhoids -- mixed internal and external -- two  areas.   CLINICAL SUMMARY:  [age]-year-old female with large prolapsing hemorrhoids  that are combined internal and external.  They are in the right  posterolateral position and left lateral position.   OPERATIVE FINDINGS:  Anoscopic examination confirmed the presence of the  hemorrhoids and no anterior hemorrhoids.  There were no rectal masses.   OPERATIVE PROCEDURE:  Under satisfactory general endotracheal anesthesia,  the patient was placed in the lithotomy position and prepped and draped in a  standard fashion, anoscopic examination performed and the anoscope left in  for good exposure.  The right posterior hemorrhoid was approached first.  Hemostat placed on the skin, second hemostat at the junction of skin and  mucosa, Allis clamp on the hemorrhoidal complex.  A 2-0 chromic suture  ligature was placed through the pedicle in a figure-of-eight fashion, tied  and the ends left long.  Then a diamond-shaped incision was made beginning  at the apex and extending out to the skin, conserving skin and mucosa, but  removing the hemorrhoidal complex.  The hemorrhoid was then dissected off  the underlying sphincter and muscle with the cautery and the sphincter was  not injured.  Bleeders were coagulated.  The hemorrhoid was then excised and  the same suture was used in a running fashion to approximate the mucosa and  submucosa and then carried out in the subcu and the skin.  The operative  site was infiltrated with Marcaine with epinephrine to allow more analgesia  postop.   The left lateral hemorrhoid was exposed and treated in identical fashion.  When finished, the areas were both hemostatic, digital examination showed  normal sphincter and no stenosis.  A light dressing was applied and the  patient went to the recovery room from the operating room in good condition  without complication.                                               Gita Kudo, M.D.    MRL/MEDQ  D:  11/14/2002  T:  11/14/2002  Job:  [id number]

## 2011-01-17 NOTE — H&P (Signed)
NAMEPORSCHIA, WILLBANKS NO.:  [id number]   MEDICAL RECORD NO.:  [id number]          PATIENT TYPE:  OIB   LOCATION:  NA                           FACILITY:  [facility]   PHYSICIAN:  Peter M. Swaziland, M.D.  DATE OF BIRTH:  1971-03-26   DATE OF ADMISSION:  11/15/2004  DATE OF DISCHARGE:                                HISTORY & PHYSICAL   HISTORY OF PRESENT ILLNESS:  Ms. Leyva is a 40 year old [place] female who  has been followed for a number of years for chronic aortic insufficiency.  She was initially evaluated here in 1998She has no known history of  rheumatic fever.  She has been followed with serial echocardiograms, which  have indicated moderate to severe aortic insufficiency.  Recent follow up  echo study, however, showed severe aortic insufficiency.  Also, by Doppler,  there was estimation of severe pulmonary hypertension with estimated right  ventricular diastolic pressure of 98 mmHg.  There was also a small aortic  valve gradient.  The patient denies any increased symptoms of dyspnea,  palpitations, chest pain, or fatigue.  She has had a recent strep throat  infection that was treated promptly.  Given the echocardiographic changes,  however, it was recommended the patient undergo cardiac catheterization to  further evaluate her pulmonary pressures and aortic valve.  She may be  approaching the need for aortic valve replacement.   PAST MEDICAL HISTORY:  1.  She has had previous fracture of her right leg in 1991.  2.  She has a history of allergic rhinitis.   MEDICATIONS:  She is on no medications.   ALLERGIES:  No known allergies.   SOCIAL HISTORY:  She is married.  She has one child.  She denies tobacco or  alcohol use.   FAMILY HISTORY:  Her parents are alive and well.  She has 3 siblings who are  alive and well.   REVIEW OF SYSTEMS:  Otherwise unremarkable.   PHYSICAL EXAMINATION:  GENERAL:  The patient is a young [place] female in  no distress.  VITAL SIGNS:  Blood pressure is 110/70, pulse 60 and regular, weight 125,  respirations normal.  HEENT:  Normocephalic and atraumatic.  Pupils equal, round and reactive.  Oropharynx is clear.  NECK:  Without JVD, adenopathy, thyromegaly, or bruits.  LUNGS:  Clear to auscultation and percussion.  CARDIAC:  A grade 2/6 holodiastolic murmur at the left sternal border.  There is no S3.  ABDOMEN:  Soft and nontender without masses or bruits.  EXTREMITIES:  Pulses are 2+ and symmetric, but did not have a typical pistol  shot configuration.  She has no edema.  NEUROLOGIC:  Nonfocal.   LABORATORY DATA:  ECG is normal.  Chest x-ray shows no active disease.   IMPRESSION:  1.  Severe aortic insufficiency by echocardiogram.  2.  Question of pulmonary hypertension.  This is suggested by Doppler signal      by echo, but other findings are absent.   PLAN:  Will perform right and left heart catheterization, aortogram.  Further therapy pending these results.  PMJ/MEDQ  D:  11/14/2004  T:  11/14/2004  Job:  [id number]

## 2011-01-17 NOTE — Op Note (Signed)
Angela, Chang                   ACCOUNT NO.:  [id number]   MEDICAL RECORD NO.:  [id number]          PATIENT TYPE:  INP   LOCATION:  2301                         FACILITY:  [facility]   PHYSICIAN:  Sheliah Plane, MD    DATE OF BIRTH:  [birth year]   DATE OF PROCEDURE:  12/20/2004  DATE OF DISCHARGE:                                 OPERATIVE REPORT   PREOPERATIVE DIAGNOSIS:  Severe aortic insufficiency.   POSTOPERATIVE DIAGNOSIS:  Severe aortic insufficiency.   SURGICAL PROCEDURE:  Aortic valve replacement with an On-X mechanical valve,  serial #[id number], 21 mm.   SURGEON:  Sheliah Plane, MD.   FIRST ASSISTANT:  Tressie Stalker, MD.   SECOND ASSISTANT:  Stephanie Acre. [name], PA.   BRIEF HISTORY:  The patient is a 40 year old [place] woman, who has been  followed for several years with aortic insufficiency.  Because of  progressive dilatation of the heart and increasing episodes of shortness of  breath, aortic valve replacement was recommended to the patient.  She had  mild dilatation of the aortic root.  After considering all options with her,  she elected to proceed with mechanical valve replacement, and the risks and  options with Coumadin were discussed with her.  She agreed and signed  informed consent.   DESCRIPTION OF PROCEDURE:  With Swan-Ganz and arterial line monitors in  place, the patient underwent general endotracheal anesthesia without  incident. The skin of the chest and back was prepped with Betadine and  draped in the usual sterile manner.  A median sternotomy was performed, the  pericardium was opened.  The patient had mild dilatation of the aortic root  and had slight left ventricular enlargement.  TEE was done intraoperatively  and a separate dictation outlines that.  The patient was systemically  heparinized.  The ascending aorta and the right atrium were cannulated.  A  retrograde cardioplegia catheter was placed through the right atrium and  then into the  coronary sinus.  A right superior pulmonary vein vent was  placed.  The patient's body temperature was cooled to 30 degrees.  Aortic  cross-clamp was applied and 600 mL of cold blood potassium cardioplegia was  administered retrograde, with rapid diastolic arrest of the heart.  Myocardial septal temperature was monitored throughout the cross-clamp.  A  transverse aortotomy was performed, giving good exposure of a slightly  dilated aortic root, but not to the point requiring replacement.  The non-  coronary leaflet was severely distorted.  The right and left coronary  leaflets were also deformed, but to a lesser degree.  The valve was excised  and sized for a 21 On-X mechanical valve.  The #2 Tycron pledgeted sutures  were placed circumferentially through the annulus.  The valve was then  seated in place and secured, seating well.  There was free movement of the  leaflets without any impingement.  The body temperature was rewarmed to 37  degrees.  Using a running 4-0 Prolene over felt strips, the aortotomy was  closed in a horizontal mattress fashion and the second layer of running  over-  and-over 4-0 Prolene.  Before complete closure, the head was put in a down  position, the heart was allowed to passively fill and warm retrograde  cardioplegia was administered.  The aortic cross-clamp was removed and  aortotomy closure was completed.  Patient returned to a sinus rhythm.  Two  atrial and two ventricular pacing wires were applied.  With the patient's  body temperature rewarmed to 37 degrees, she was then ventilated and weaned  from cardiopulmonary bypass without difficulty.  TEE showed good valve  function.  The patient remained hemodynamically stable, was decannulated,  and protamine sulfate was administered with the operative field hemostatic.  Two atrial and two ventricular pacing wires applied.  Two mediastinal tubes  were left in place.  The pericardium was loosely reapproximated, the  sternum  closed with #6 stainless steel wire, the fascia closed with interrupted 0  Vicryl, plain 3-0 Vicryl in the subcutaneous tissue, and 4-0 subcuticular  stitch on the skin edges.  Dry dressings were applied.  Sponge and needle  count was reported as correct at the conclusion of the procedure.  The  patient tolerated the procedure without obvious complication and was  transferred to the surgical intensive care unit for further postoperative  care.      EG/MEDQ  D:  12/22/2004  T:  12/22/2004  Job:  [id number]   cc:   Peter M. [place], M.D.  [address], [address]  [place], [place] [address]  Fax: [phone]

## 2011-01-17 NOTE — Op Note (Signed)
[name], [name]                   ACCOUNT NO.:  [id number]   MEDICAL RECORD NO.:  [id number]          PATIENT TYPE:  INP   LOCATION:  2301                         FACILITY:  [facility]   PHYSICIAN:  Kaylyn Layer. Michelle Piper, M.D.   DATE OF BIRTH:  Jun 26, 1971   DATE OF PROCEDURE:  12/20/2004  DATE OF DISCHARGE:                                 OPERATIVE REPORT   TYPE OF REPORT:  Transesophageal echocardiogram probe placement and  monitoring during aortic valve replacement.   I was consulted by Dr. Sheliah Plane to place the transesophageal  echocardiogram probe into the [facility] for intraoperative evaluation and  monitoring of aortic insufficiency.   The patient is a 40 year old [place] female with a history of shortness of  breath and worsening of aortic insufficiency on successive echocardiograms.  She presents today for aortic valve replacement.   After uneventful induction of anesthesia and endotracheal intubation, the  transesophageal echocardiogram probe was easily passed into the patient's  stomach.  Initial short axis view of the left ventricle showed mild left  ventricular hypertrophy with good wall motion abnormalities in all  quadrants.   To enter the mitral valve, the mitral valve __________  structure is normal.  The edges coapted normally, the edges coapted normally.  On placing color  flow Doppler across the valve, there was trace to 1+ mitral regurgitation  seen best about 160 degrees rotation.  Left atrium was normal.  Flow in the  pulmonary veins was normal.  Turning to the aortic valve, the valve edges  were somewhat calcified and a defect at end diastole could clearly be seen  centrally in the valve and eccentrically on the inferior aspect of the  valve.  Concomitant for this, there was an eccentric jet seen in the left  ventricular outflow tract which tracked along the septal wall into the body  of the ventricle.   This could also be clearly visualized in the longitudinal  view of the valve  and there was mild aortic stenosis.  The intraaortic septum was normal,  right atrium was normal and there was mild tricuspid regurgitation, probably  secondary to pulmonary artery catheter placement.   The patient underwent an aortic valve replacement.  Prior to discontinuing  cardiopulmonary bypass, I evaluated the heart for intracardiac air, which  there was minimal amount, and cardiopulmonary bypass was discontinued using  minimal inotropes.   Evaluation at this point showed the prosthetic valve in the aortic root with  some mild perivalvular jets which were within normal limits for the valve.  The valve leaflets were seen to be operating normally.  The left ventricle  at this point was unchanged from preop with mild left ventricular  hypertrophy and no wall motion abnormalities.  Left atrium was normal,  mitral valve showed trace of central mitral regurgitation, best seen in the  160 degree plane, and the rest of the exam was unchanged from preop.   At the end of the procedure transesophageal echocardiogram probe was removed  from the patient.  The patient was taken intubated, in stable condition, to  the intensive care unit.      KDO/MEDQ  D:  12/20/2004  T:  12/22/2004  Job:  [id number]   cc:   Anesthesia Department
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
## 2011-01-23 ENCOUNTER — Encounter: Payer: BC Managed Care – PPO | Admitting: *Deleted

## 2011-01-24 ENCOUNTER — Ambulatory Visit (INDEPENDENT_AMBULATORY_CARE_PROVIDER_SITE_OTHER): Payer: BC Managed Care – PPO | Admitting: *Deleted

## 2011-01-24 DIAGNOSIS — I4891 Unspecified atrial fibrillation: Secondary | ICD-10-CM

## 2011-01-24 LAB — POCT INR: INR: 2.7

## 2011-02-10 ENCOUNTER — Encounter: Payer: BC Managed Care – PPO | Admitting: *Deleted

## 2011-02-25 ENCOUNTER — Encounter: Payer: BC Managed Care – PPO | Admitting: *Deleted

## 2011-02-28 ENCOUNTER — Ambulatory Visit (INDEPENDENT_AMBULATORY_CARE_PROVIDER_SITE_OTHER): Payer: BC Managed Care – PPO | Admitting: *Deleted

## 2011-02-28 DIAGNOSIS — I4891 Unspecified atrial fibrillation: Secondary | ICD-10-CM

## 2011-02-28 LAB — POCT INR: INR: 3

## 2011-03-10 ENCOUNTER — Other Ambulatory Visit: Payer: Self-pay | Admitting: Cardiology

## 2011-03-10 NOTE — Telephone Encounter (Signed)
escribe medication per fax request  

## 2011-03-18 ENCOUNTER — Other Ambulatory Visit: Payer: Self-pay | Admitting: Gastroenterology

## 2011-03-18 DIAGNOSIS — B181 Chronic viral hepatitis B without delta-agent: Secondary | ICD-10-CM

## 2011-03-18 MED ORDER — BARACLUDE 0.5 MG PO TABS: 0.5000 mg | ORAL_TABLET | ORAL | Status: DC

## 2011-03-18 MED ORDER — BARACLUDE 0.5 MG PO TABS: 0.5000 mg | ORAL_TABLET | ORAL | Status: AC

## 2011-04-01 ENCOUNTER — Ambulatory Visit (INDEPENDENT_AMBULATORY_CARE_PROVIDER_SITE_OTHER): Payer: BC Managed Care – PPO | Admitting: *Deleted

## 2011-04-01 DIAGNOSIS — I4891 Unspecified atrial fibrillation: Secondary | ICD-10-CM

## 2011-04-30 ENCOUNTER — Encounter: Payer: BC Managed Care – PPO | Admitting: *Deleted

## 2011-05-08 ENCOUNTER — Other Ambulatory Visit: Payer: Self-pay | Admitting: Gastroenterology

## 2011-05-08 ENCOUNTER — Ambulatory Visit (INDEPENDENT_AMBULATORY_CARE_PROVIDER_SITE_OTHER): Payer: BC Managed Care – PPO | Admitting: Gastroenterology

## 2011-05-08 VITALS — BP 102/72 | HR 75 | Temp 98.2°F | Ht 64.0 in | Wt 131.0 lb

## 2011-05-08 DIAGNOSIS — B181 Chronic viral hepatitis B without delta-agent: Secondary | ICD-10-CM

## 2011-05-08 LAB — HEPATIC FUNCTION PANEL
ALT: 15 U/L (ref 0–35)
AST: 19 U/L (ref 0–37)
Albumin: 4 g/dL (ref 3.5–5.2)
Alkaline Phosphatase: 29 U/L — ABNORMAL LOW (ref 39–117)
Bilirubin, Direct: 0.2 mg/dL (ref 0.0–0.3)
Indirect Bilirubin: 0.5 mg/dL (ref 0.0–0.9)
Total Bilirubin: 0.7 mg/dL (ref 0.3–1.2)

## 2011-05-09 LAB — HEPATITIS B SURFACE ANTIGEN: Hepatitis B Surface Ag: POSITIVE — AB

## 2011-05-09 LAB — HEPATITIS B SURFACE ANTIBODY,QUALITATIVE: Hep B S Ab: NEGATIVE

## 2011-05-09 LAB — AFP TUMOR MARKER: AFP-Tumor Marker: 2.6 ng/mL (ref 0.0–8.0)

## 2011-05-13 ENCOUNTER — Ambulatory Visit (INDEPENDENT_AMBULATORY_CARE_PROVIDER_SITE_OTHER): Payer: BC Managed Care – PPO | Admitting: *Deleted

## 2011-05-13 DIAGNOSIS — I4891 Unspecified atrial fibrillation: Secondary | ICD-10-CM

## 2011-05-15 LAB — HEPATITIS B DNA, ULTRAQUANTITATIVE, PCR
Hepatitis B DNA (Calc): 541 copies/mL — ABNORMAL HIGH (ref <116)
Hepatitis B DNA: 93 IU/mL — ABNORMAL HIGH (ref <20)

## 2011-05-22 ENCOUNTER — Encounter: Payer: Self-pay | Admitting: Gastroenterology

## 2011-05-22 NOTE — Progress Notes (Addendum)
[name], [name]  MR#:  [id number]      DATE:  05/08/2011  DOB:  11/05/1970    cc: Consulting Physician:  Coralyn Helling, MD, [facility], [address] [place], [place], [place] [address], Phone [phone] Peter [place], MD, [facility], [address], [address], [place], [place] [address], Fax [phone] Primary Care Physician:  Same. Referring Physician:  Warner Mccreedy, MD, [facility], [address] [place], [place] [address], Fax [phone]    REASON FOR VISIT:  Follow up of genotype B, E antigen positive HBV.    history:  The patient returns today unaccompanied. Since last being seen on 11/14/2010, there have been no symptoms referable to her history of hepatitis B. There are no symptoms to suggest decompensated liver  disease or vasculitis. She had started on entecavir on 05/15/2009 putting her week 103, or 1 year and 11 months and 23 days of  treatment.  She complains of some intermittent right upper quadrant pain particularly with motion.   PAST MEDICAL HISTORY:  Significant for aortic valve replacement in April 2006.   CURRENT MEDICATIONS:  Coumadin 4 mg p.o. daily, entecavir 0.5 mg p.o. daily, calcium with vitamin D, fish oil, Benadryl p.r.n.    Allergies:  Denies.   HABITS:  Smoking, denies. Interval alcohol consumption, denies.   REVIEW OF SYSTEMS:  All 10 systems reviewed today with the patient and they are negative other than which is mentioned above. CES-D was incomplete.     PHYSICAL EXAMINATION:  Constitutional: Well appearing. No stigmata of chronic liver disease. Vital signs: Height 64 inches, weight 131 pounds, blood pressure 102/72, pulse 75, temperature 98.2 Fahrenheit.  Ears, nose, mouth and  throat:  Unremarkable oropharynx.  No thyromegaly or neck masses.  Chest:  Resonant to percussion.  Clear to auscultation.  Cardiovascular:  Heart sounds normal S1, S2 without murmurs or rubs.   There is no  peripheral edema.  Abdominal:  Normal bowel sounds.  No masses or tenderness.  I could not appreciate a liver edge or spleen tip.  I could not appreciate any hernias.  Lymphatics:  No cervical or  inguinal lymphadenopathy.  Central Nervous System:  No asterixis or focal neurologic findings.  Dermatologic:  Anicteric without palmar  erythema or spider angiomata.  Eyes:  Anicteric sclerae.  Pupils are equal and reactive to light.   laboratories:  Testing from 11/28/2010 were reviewed. ALT was 18. Hepatitis B surface antigen was positive, hepatitis B surface antibody negative, hepatitis B E antigen positive, hepatitis B E antibody negative, HBV DNA 293  international units per mL.  Most recent ultrasound was on 06/04/2010, which was unremarkable.   ASSESSMENT:  The patient is a 40 year old [place] woman with genotype B, E antigen positive who has now completed approximately 103 weeks of treatment with entecavir with suppression of her HBV DNA. However, she continues to maintain her E antigen positivity. This would suggest it would be premature to consider taking her off entecavir.  In terms her liver care, she is hepatitis A immune. There are no symptoms of decompensated liver disease to treat. Last ultrasound was in 06/04/2010, and needs to have another one scheduled.  In my discussion today with the patient, we discussed her progress to date. She explained that she is finding the co-payment on her [employer] to be very expensive and asked if there is something I can  do about this. I indicated to her that I could discuss this with  the [employer] rep to see if there are any programs that she may qualify for assistance with payment.   plan:  1. Standard labs including hepatitis B serology and HBV DNA. 2. Hepatitis A immune. 3. Book for ultrasound for October 2012. 4. To return in 6 months' time in follow up.            Brooke Dare, MD   ADDENDUM ALT 15  HBV DNA 93 IU/mL   Cannot find the e Ag and  e Ab.  I spoke with her pharmacy, who confirm that the medication is the highest tier (25% of cost charged to patient, even after the [employer] discount card).  I spoke with the [employer] rep who suggested that the patient call their assistance line at [phone] press 0 ask for [place]  I also confirmed that Tenofovir would have the same 25% of cost monthly charge to the patient.  [phone] [id number]  D:  Thu Sep 06 17:05:49 2012 ; T:  Thu Sep 06 18:52:55 2012  Job #:  [id number]

## 2011-06-10 ENCOUNTER — Encounter: Payer: BC Managed Care – PPO | Admitting: *Deleted

## 2011-06-17 ENCOUNTER — Ambulatory Visit (HOSPITAL_COMMUNITY)
Admission: RE | Admit: 2011-06-17 | Discharge: 2011-06-17 | Disposition: A | Discharge status: Home / Self Care | Payer: BC Managed Care – PPO | Source: Ambulatory Visit | Attending: Gastroenterology | Admitting: Gastroenterology

## 2011-06-17 DIAGNOSIS — B181 Chronic viral hepatitis B without delta-agent: Secondary | ICD-10-CM

## 2011-06-24 ENCOUNTER — Ambulatory Visit (INDEPENDENT_AMBULATORY_CARE_PROVIDER_SITE_OTHER): Payer: BC Managed Care – PPO | Admitting: *Deleted

## 2011-06-24 DIAGNOSIS — I4891 Unspecified atrial fibrillation: Secondary | ICD-10-CM

## 2011-06-24 LAB — POCT INR: INR: 2.6

## 2011-07-29 ENCOUNTER — Encounter: Payer: BC Managed Care – PPO | Admitting: *Deleted

## 2011-08-05 ENCOUNTER — Ambulatory Visit (INDEPENDENT_AMBULATORY_CARE_PROVIDER_SITE_OTHER): Payer: BC Managed Care – PPO | Admitting: *Deleted

## 2011-08-05 DIAGNOSIS — I4891 Unspecified atrial fibrillation: Secondary | ICD-10-CM

## 2011-08-05 LAB — POCT INR: INR: 4.2

## 2011-08-21 ENCOUNTER — Encounter: Payer: BC Managed Care – PPO | Admitting: *Deleted

## 2011-08-27 ENCOUNTER — Encounter: Payer: BC Managed Care – PPO | Admitting: *Deleted

## 2011-09-03 ENCOUNTER — Ambulatory Visit (INDEPENDENT_AMBULATORY_CARE_PROVIDER_SITE_OTHER): Payer: BC Managed Care – PPO | Admitting: *Deleted

## 2011-09-03 DIAGNOSIS — I4891 Unspecified atrial fibrillation: Secondary | ICD-10-CM

## 2011-09-03 LAB — POCT INR: INR: 3

## 2011-10-01 ENCOUNTER — Encounter: Payer: BC Managed Care – PPO | Admitting: *Deleted

## 2011-10-07 ENCOUNTER — Ambulatory Visit (INDEPENDENT_AMBULATORY_CARE_PROVIDER_SITE_OTHER): Payer: BC Managed Care – PPO

## 2011-10-07 DIAGNOSIS — I4891 Unspecified atrial fibrillation: Secondary | ICD-10-CM

## 2011-11-05 ENCOUNTER — Other Ambulatory Visit: Payer: Self-pay | Admitting: Cardiology

## 2011-11-12 ENCOUNTER — Ambulatory Visit (INDEPENDENT_AMBULATORY_CARE_PROVIDER_SITE_OTHER): Payer: BC Managed Care – PPO | Admitting: *Deleted

## 2011-11-12 DIAGNOSIS — I4891 Unspecified atrial fibrillation: Secondary | ICD-10-CM

## 2011-12-11 ENCOUNTER — Ambulatory Visit (INDEPENDENT_AMBULATORY_CARE_PROVIDER_SITE_OTHER): Payer: BC Managed Care – PPO | Admitting: Gastroenterology

## 2011-12-11 DIAGNOSIS — B182 Chronic viral hepatitis C: Secondary | ICD-10-CM

## 2011-12-11 DIAGNOSIS — B181 Chronic viral hepatitis B without delta-agent: Secondary | ICD-10-CM

## 2011-12-11 LAB — HEPATIC FUNCTION PANEL
ALT: 17 U/L (ref 0–35)
AST: 21 U/L (ref 0–37)
Albumin: 3.9 g/dL (ref 3.5–5.2)
Alkaline Phosphatase: 31 U/L — ABNORMAL LOW (ref 39–117)
Total Protein: 7.2 g/dL (ref 6.0–8.3)

## 2011-12-12 LAB — AFP TUMOR MARKER: AFP-Tumor Marker: 4.1 ng/mL (ref 0.0–8.0)

## 2011-12-15 LAB — HEPATITIS B DNA, ULTRAQUANTITATIVE, PCR: Hepatitis B DNA (Calc): 349 copies/mL — ABNORMAL HIGH (ref <116)

## 2011-12-15 LAB — HEPATITIS B E ANTIGEN: Hepatitis Be Antigen: POSITIVE — AB

## 2011-12-16 ENCOUNTER — Ambulatory Visit (INDEPENDENT_AMBULATORY_CARE_PROVIDER_SITE_OTHER): Payer: BC Managed Care – PPO | Admitting: Pharmacist

## 2011-12-16 ENCOUNTER — Ambulatory Visit (INDEPENDENT_AMBULATORY_CARE_PROVIDER_SITE_OTHER): Payer: BC Managed Care – PPO | Admitting: Cardiology

## 2011-12-16 ENCOUNTER — Encounter: Payer: Self-pay | Admitting: Cardiology

## 2011-12-16 VITALS — BP 110/60 | HR 68 | Ht 65.0 in | Wt 130.8 lb

## 2011-12-16 DIAGNOSIS — I359 Nonrheumatic aortic valve disorder, unspecified: Secondary | ICD-10-CM

## 2011-12-16 DIAGNOSIS — Z952 Presence of prosthetic heart valve: Secondary | ICD-10-CM | POA: Insufficient documentation

## 2011-12-16 DIAGNOSIS — Z7901 Long term (current) use of anticoagulants: Secondary | ICD-10-CM

## 2011-12-16 DIAGNOSIS — I4891 Unspecified atrial fibrillation: Secondary | ICD-10-CM

## 2011-12-16 LAB — POCT INR: INR: 2.7

## 2011-12-16 NOTE — Assessment & Plan Note (Signed)
Her valve prosthesis is normal on exam. We will continue with anticoagulation and routine SBE prophylaxis.

## 2011-12-16 NOTE — Patient Instructions (Signed)
Continue your current therapy  I will see you again in one year.   

## 2011-12-16 NOTE — Assessment & Plan Note (Signed)
INR is therapeutic today. 

## 2011-12-16 NOTE — Progress Notes (Signed)
   [name] Date of Birth: 1971/04/18 Medical Record #[id number]  History of Present Illness: [name] is seen today for followup. [name] has a history of severe aortic insufficiency and underwent aortic valve replacement with a mechanical prosthesis in April of 2006. This was a #21 mm On-X valve. [name] has done very well since then with the exception of some chronic dyspnea. [name]'s had extensive pulmonary and cardiac evaluation for this which has been unremarkable. [name] is on chronic anticoagulation. [name] does have a history of hepatitis B and is being treated by Dr. Jacqualine Mau.  Current Outpatient Prescriptions on File Prior to Visit  Medication Sig Dispense Refill  . BARACLUDE 0.5 MG tablet Take 1 tablet (0.5 mg total) by mouth 1 day or 1 dose. Take once daily by mouth without interruption for Hepatitis B.  30 tablet  11  . warfarin (COUMADIN) 4 MG tablet TAKE 1 TABLET BY MOUTH EVERY DAY EXCEPT TAKE 1 AND  1/2 TABLET ON SUNDAY AND THURSDAY: OR TAKE AS DIRECTED  45 tablet  3    No Known Allergies  Past Medical History  Diagnosis Date  . Severe aortic insufficiency   . Femur fracture, left 1990    with open reduction and internal fixation   . H/O: rheumatic fever   . History of allergic rhinitis   . Hepatitis B carrier     positive hepatitis B carrier    Past Surgical History  Procedure Date  . Aortic valve replacement 4/06    21mm On-X mechanical valve  . Hemorrhoid surgery   . Cardiac catheterization 11/15/2004    Ejection fraction is estimated 55%    History  Smoking status  . Never Smoker   Smokeless tobacco  . Not on file    History  Alcohol Use No    Family History  Problem Relation Age of Onset  . Heart disease Brother     Review of Systems: As noted in history of present illness.  All other systems were reviewed and are negative.  Physical Exam: BP 110/60  Pulse 68  Ht 5\' 5"  (1.651 m)  Wt 130 lb 12.8 oz (59.33 kg)  BMI 21.77 kg/m2 [name] is a pleasant, Asian female  in no acute distress. HEENT exam is unremarkable. [name] is no JVD or bruits. Lungs are clear. Cardiac exam reveals a regular rate and rhythm without gallop. [name] has a good mechanical aortic valve click. Abdomen is soft and nontender. [name] has no significant edema. Skin is warm and dry. [name] is alert and oriented x3. Cranial nerves II through XII are intact. LABORATORY DATA: ECG today demonstrates normal sinus rhythm with a normal ECG. INR today is 2.7.  Assessment / Plan:

## 2011-12-18 NOTE — Progress Notes (Signed)
[name], [name]  MR#:  [id number]      DATE:  12/11/2011  DOB:  06/14/1971    cc: Consulting Physician: Coralyn Helling, MD, [facility], [address] [place], [place], [place] [address], Phone [phone]   Peter Swaziland, MD [facility], [address], [address], [place], [place] [address], Fax [phone]  Primary Care Physician: Same  Referring Physician:  Warner Mccreedy, MD, [facility], [address] [place], [place] [address], Fax [phone]    REASON FOR VISIT: Followup of genotype B, E antigen positive HBV.    HISTORY:  The patient returns today unaccompanied. She has no symptoms referable to her history of hepatitis B. There are no symptoms to suggest vasculitis or decompensated liver disease. Her start date for entecavir was 05/15/2009, so she is now 134 weeks or 2 years and 6 months and 28 days into treatment with entecavir. She is continuing to maintained E antigen positivity on previous determinations, so I have not I have not reached that point where I can discontinue medication.   PAST MEDICAL HISTORY:  Significant for aortic valve replacement April 2006.   CURRENT MEDICATIONS:  1. Coumadin 4 mg daily.  2. Entecavir 0.5 mg daily.  3. Calcium with vitamin D.  4. Benadryl p.r.n.  5. Fish oil.   ALLERGIES: Denies.   HABITS:  Smoking, denies. Interval alcohol consumption denies.   REVIEW OF SYSTEMS:  All 10 systems reviewed today with the patient and they are negative other than which was mentioned above.   PHYSICAL EXAMINATION: Constitutional: Well-appearing. There is no stigmata of chronic liver disease. Vital Signs: Height 64 inches, weight 131 pounds, unchanged from previous. Blood pressure 100/68, pulse 80, temperature 98.1 Fahrenheit. Ears, Nose, Mouth and Throat:  Unremarkable oropharynx.  No thyromegaly or neck masses.  Chest:  Resonant to percussion.  Clear to auscultation.  Cardiovascular:  Heart sounds normal  S1, S2 without murmurs or rubs.  There is no peripheral edema.  Abdomen:  Normal bowel sounds.  No masses or tenderness.  I could not appreciate a liver edge or spleen tip.  I could not appreciate any hernias.  Lymphatics:  No cervical or inguinal lymphadenopathy.  Central Nervous System:  No asterixis or focal neurologic findings.  Dermatologic:  Anicteric without palmar erythema or spider angiomata.  Eyes:  Anicteric sclerae.  Pupils are equal and reactive to light.  LABORATORIES:  Laboratory work from previous appointment on 05/08/2011, her HBV DNA was 93 international units per mL and an ALT was 15. Her hepatitis B E antigen and E antibody were not done.  Most recent imaging on 05/08/2011, showed an unremarkable liver.   ASSESSMENT:  The patient is a 41 year old woman with history of genotype B, E antigen positive HBV who has not undergone E antigen seroconversion as of yet. With her start date for entecavir at 05/15/2009, she is currently 134 weeks of treatment.   In terms of liver care, she is hepatitis A immune. Her last ultrasound was in 06/17/2011. Given the fact she has no family history of liver disease, particularly hepatocellular carcinoma, and she is still only 16 in keeping with the HBV [place] treatment algorithm, she does not need screening until the age of 23 unless she has a family history of liver cancer. Therefore, I will hold on the ultrasound going forward.   In my discussion today with the patient, I reviewed her previous lab work with her and explained the endpoints to treatment. I have  explained to why she should not stop the entecavir at this time.   PLAN:  1. Standard labs today including HBV DNA and HBV serologies.  2. Hepatitis A immune.  3. Return in 6 months' time in followup. She reports she has sufficient entecavir until that time.               Brooke Dare, MD    ADDENDUM:  ALT 17.  HBV DNA 60 IU/mL  Hep B e Ag positive    [phone]  D:  Thu Apr 11  13:31:17 2013 ; T:  Thu Apr 11 19:21:31 2013  Job #:  [id number]

## 2012-01-13 ENCOUNTER — Ambulatory Visit (INDEPENDENT_AMBULATORY_CARE_PROVIDER_SITE_OTHER): Payer: BC Managed Care – PPO | Admitting: Pharmacist

## 2012-01-13 DIAGNOSIS — I4891 Unspecified atrial fibrillation: Secondary | ICD-10-CM

## 2012-01-13 LAB — POCT INR: INR: 3.7

## 2012-02-11 ENCOUNTER — Ambulatory Visit (INDEPENDENT_AMBULATORY_CARE_PROVIDER_SITE_OTHER): Payer: BC Managed Care – PPO | Admitting: *Deleted

## 2012-02-11 DIAGNOSIS — I359 Nonrheumatic aortic valve disorder, unspecified: Secondary | ICD-10-CM

## 2012-02-11 DIAGNOSIS — Z7901 Long term (current) use of anticoagulants: Secondary | ICD-10-CM | POA: Insufficient documentation

## 2012-02-11 DIAGNOSIS — Z954 Presence of other heart-valve replacement: Secondary | ICD-10-CM

## 2012-02-11 DIAGNOSIS — I4891 Unspecified atrial fibrillation: Secondary | ICD-10-CM

## 2012-02-11 DIAGNOSIS — Z952 Presence of prosthetic heart valve: Secondary | ICD-10-CM

## 2012-02-12 ENCOUNTER — Other Ambulatory Visit: Payer: Self-pay | Admitting: Gastroenterology

## 2012-03-09 ENCOUNTER — Telehealth: Payer: Self-pay | Admitting: Cardiology

## 2012-03-09 NOTE — Telephone Encounter (Signed)
New Problem:    Patient would like to move her appointment from 03/10/12 to 03/17/12, she will be busy.  Please call back.

## 2012-03-09 NOTE — Telephone Encounter (Signed)
Spoke to pt  Appt rescheduled

## 2012-03-16 ENCOUNTER — Ambulatory Visit (INDEPENDENT_AMBULATORY_CARE_PROVIDER_SITE_OTHER): Payer: BC Managed Care – PPO | Admitting: *Deleted

## 2012-03-16 DIAGNOSIS — I359 Nonrheumatic aortic valve disorder, unspecified: Secondary | ICD-10-CM

## 2012-03-16 DIAGNOSIS — Z952 Presence of prosthetic heart valve: Secondary | ICD-10-CM

## 2012-03-16 DIAGNOSIS — Z954 Presence of other heart-valve replacement: Secondary | ICD-10-CM

## 2012-03-16 DIAGNOSIS — Z7901 Long term (current) use of anticoagulants: Secondary | ICD-10-CM

## 2012-03-16 DIAGNOSIS — I4891 Unspecified atrial fibrillation: Secondary | ICD-10-CM

## 2012-04-08 ENCOUNTER — Other Ambulatory Visit: Payer: Self-pay | Admitting: Cardiology

## 2012-04-21 ENCOUNTER — Ambulatory Visit (INDEPENDENT_AMBULATORY_CARE_PROVIDER_SITE_OTHER): Payer: BC Managed Care – PPO | Admitting: *Deleted

## 2012-04-21 DIAGNOSIS — I359 Nonrheumatic aortic valve disorder, unspecified: Secondary | ICD-10-CM

## 2012-04-21 DIAGNOSIS — I4891 Unspecified atrial fibrillation: Secondary | ICD-10-CM

## 2012-04-21 DIAGNOSIS — Z952 Presence of prosthetic heart valve: Secondary | ICD-10-CM

## 2012-04-21 DIAGNOSIS — Z7901 Long term (current) use of anticoagulants: Secondary | ICD-10-CM

## 2012-04-21 DIAGNOSIS — Z954 Presence of other heart-valve replacement: Secondary | ICD-10-CM

## 2012-04-28 ENCOUNTER — Ambulatory Visit (INDEPENDENT_AMBULATORY_CARE_PROVIDER_SITE_OTHER): Payer: BC Managed Care – PPO | Admitting: Family Medicine

## 2012-04-28 VITALS — BP 95/62 | HR 74 | Temp 97.4°F | Resp 18 | Ht 63.5 in | Wt 129.0 lb

## 2012-04-28 DIAGNOSIS — Z0000 Encounter for general adult medical examination without abnormal findings: Secondary | ICD-10-CM

## 2012-04-28 DIAGNOSIS — B1910 Unspecified viral hepatitis B without hepatic coma: Secondary | ICD-10-CM

## 2012-04-28 LAB — COMPREHENSIVE METABOLIC PANEL
ALT: 11 U/L (ref 0–35)
AST: 17 U/L (ref 0–37)
Calcium: 8.8 mg/dL (ref 8.4–10.5)
Chloride: 107 mEq/L (ref 96–112)
Creat: 0.53 mg/dL (ref 0.50–1.10)

## 2012-04-28 LAB — POCT CBC
Granulocyte percent: 52.5 %G (ref 37–80)
MCH, POC: 28.3 pg (ref 27–31.2)
MID (cbc): 0.4 (ref 0–0.9)
MPV: 9 fL (ref 0–99.8)
POC Granulocyte: 2.3 (ref 2–6.9)
POC MID %: 8.8 %M (ref 0–12)
Platelet Count, POC: 282 10*3/uL (ref 142–424)
RBC: 4.6 M/uL (ref 4.04–5.48)

## 2012-04-28 LAB — LIPID PANEL
HDL: 52 mg/dL (ref >39)
LDL Cholesterol: 120 mg/dL — ABNORMAL HIGH (ref 0–99)
Total CHOL/HDL Ratio: 3.6 Ratio
Triglycerides: 83 mg/dL (ref <150)

## 2012-04-28 NOTE — Progress Notes (Addendum)
[facility] [address] [place] [address] [phone]  Date:  04/28/2012   Name:  [name]   DOB:  09-14-1970   MRN:  [id number]  PCP:  Tally Due, MD    Chief Complaint: Annual Exam   History of Present Illness:  [name] is a 41 y.o. very pleasant female patient who presents with the following:  Here today for a CPE but no pap/ breast exam.   She has a history of an aortic valve replacement (2006) and is on chronic coumadin.  She has her coumadin levels checked every month   She is on treatment for Hepatitis B- she will follow-up in October with Dr. Jacqualine Mau to see about duration of her therapy.   She noted that she felt dizzy about a week ago- however this resolved.   She has 3 children- they are 11, 55 and 55 years old.   Her last menses were last week.  She sees Dr. Pennie Rushing for OB, and Dr. Swaziland for her heart.    Tetanus shot 2008- tdap  Patient Active Problem List  Diagnosis  . PULMONARY NODULE  . DYSPNEA  . S/P AVR  . Chronic anticoagulation  . Aortic valve disorders  . Encounter for long-term (current) use of anticoagulants  . Hepatitis B    Past Medical History  Diagnosis Date  . Severe aortic insufficiency   . Femur fracture, left 1990    with open reduction and internal fixation   . H/O: rheumatic fever   . History of allergic rhinitis   . Hepatitis B carrier     positive hepatitis B carrier    Past Surgical History  Procedure Date  . Aortic valve replacement 4/06    21mm On-X mechanical valve  . Hemorrhoid surgery   . Cardiac catheterization 11/15/2004    Ejection fraction is estimated 55%    History  Substance Use Topics  . Smoking status: Never Smoker   . Smokeless tobacco: Not on file  . Alcohol Use: No    Family History  Problem Relation Age of Onset  . Heart disease Brother     No Known Allergies  Medication list has been reviewed and updated.  Current Outpatient Prescriptions on File  Prior to Visit  Medication Sig Dispense Refill  . warfarin (COUMADIN) 4 MG tablet TAKE 1 TABLET BY MOUTH EVERY DAY EXCEPT TAKE 1 AND  1/2 TABLET ON SUNDAY AND THURSDAY: OR TAKE AS DIRECTED  45 tablet  3  . warfarin (COUMADIN) 4 MG tablet TAKE 1 TABLET BY MOUTH EVERY DAY EXCEPT TAKE 1 AND  1/2 TABLET ON SUNDAY AND THURSDAY: OR TAKE AS DIRECTED  45 tablet  3    Review of Systems:  As per HPI- otherwise negative.  See pink sheet.  She has a history of chronic dyspnea which is undiagnosed after extensive evaluation- see note per Dr. Swaziland 12/16/11 O2 sat is 100%  Physical Examination: Filed Vitals:   04/28/12 0802  BP: 95/62  Pulse: 74  Temp: 97.4 F (36.3 C)  Resp: 18   Filed Vitals:   04/28/12 0802  Height: 5' 3.5" (1.613 m)  Weight: 129 lb (58.514 kg)   Body mass index is 22.49 kg/(m^2). Ideal Body Weight: Weight in (lb) to have BMI = 25: 143.1   GEN: WDWN, NAD, Non-toxic, A & O x 3 HEENT: Atraumatic, Normocephalic. Neck supple. No masses, No LAD.  PEERL, EOMI, fundoscopic exam normal.  TM  and oropharynx wnl.   Ears and Nose: No external deformity. CV: RRR, No M/G/R. No JVD. No thrill. No extra heart sounds. PULM: CTA B, no wheezes, crackles, rhonchi. No retractions. No resp. distress. No accessory muscle use. ABD: S, NT, ND, +BS. No rebound. No HSM. EXTR: No c/c/e NEURO Normal gait.  PSYCH: Normally interactive. Conversant. Not depressed or anxious appearing.  Calm demeanor.    Assessment and Plan: 1. Hepatitis B  POCT CBC, Comprehensive metabolic panel, TSH, Lipid panel, Vitamin D, 25-hydroxy   41 year old female here for CPE today.  Labs pending as above.  Reminded to have flu vaccine this fall.  Otherwise she is doing well- Will plan further follow- up pending labs.   Abbe Amsterdam, MD

## 2012-04-29 ENCOUNTER — Encounter: Payer: Self-pay | Admitting: Family Medicine

## 2012-05-20 ENCOUNTER — Ambulatory Visit (INDEPENDENT_AMBULATORY_CARE_PROVIDER_SITE_OTHER): Payer: BC Managed Care – PPO | Admitting: *Deleted

## 2012-05-20 DIAGNOSIS — Z952 Presence of prosthetic heart valve: Secondary | ICD-10-CM

## 2012-05-20 DIAGNOSIS — Z954 Presence of other heart-valve replacement: Secondary | ICD-10-CM

## 2012-05-20 DIAGNOSIS — I4891 Unspecified atrial fibrillation: Secondary | ICD-10-CM

## 2012-05-20 DIAGNOSIS — Z7901 Long term (current) use of anticoagulants: Secondary | ICD-10-CM

## 2012-05-20 DIAGNOSIS — I359 Nonrheumatic aortic valve disorder, unspecified: Secondary | ICD-10-CM

## 2012-06-22 ENCOUNTER — Ambulatory Visit (INDEPENDENT_AMBULATORY_CARE_PROVIDER_SITE_OTHER): Payer: BC Managed Care – PPO

## 2012-06-22 ENCOUNTER — Ambulatory Visit (INDEPENDENT_AMBULATORY_CARE_PROVIDER_SITE_OTHER): Payer: BC Managed Care – PPO | Admitting: Family Medicine

## 2012-06-22 DIAGNOSIS — Z23 Encounter for immunization: Secondary | ICD-10-CM

## 2012-06-22 DIAGNOSIS — I359 Nonrheumatic aortic valve disorder, unspecified: Secondary | ICD-10-CM

## 2012-06-22 DIAGNOSIS — Z7901 Long term (current) use of anticoagulants: Secondary | ICD-10-CM

## 2012-06-22 DIAGNOSIS — Z954 Presence of other heart-valve replacement: Secondary | ICD-10-CM

## 2012-06-22 DIAGNOSIS — Z952 Presence of prosthetic heart valve: Secondary | ICD-10-CM

## 2012-06-22 DIAGNOSIS — I4891 Unspecified atrial fibrillation: Secondary | ICD-10-CM

## 2012-08-02 ENCOUNTER — Encounter: Payer: Self-pay | Admitting: Obstetrics and Gynecology

## 2012-08-02 ENCOUNTER — Ambulatory Visit (INDEPENDENT_AMBULATORY_CARE_PROVIDER_SITE_OTHER): Payer: BC Managed Care – PPO | Admitting: Obstetrics and Gynecology

## 2012-08-02 VITALS — BP 100/60 | Ht 64.0 in | Wt 132.0 lb

## 2012-08-02 DIAGNOSIS — Z0000 Encounter for general adult medical examination without abnormal findings: Secondary | ICD-10-CM

## 2012-08-02 DIAGNOSIS — N898 Other specified noninflammatory disorders of vagina: Secondary | ICD-10-CM

## 2012-08-02 DIAGNOSIS — B3731 Acute candidiasis of vulva and vagina: Secondary | ICD-10-CM

## 2012-08-02 DIAGNOSIS — B373 Candidiasis of vulva and vagina: Secondary | ICD-10-CM

## 2012-08-02 LAB — POCT WET PREP (WET MOUNT)

## 2012-08-02 LAB — POCT URINALYSIS DIPSTICK
Glucose, UA: NEGATIVE
Protein, UA: NEGATIVE
Spec Grav, UA: 1.01

## 2012-08-02 MED ORDER — TERCONAZOLE 0.4 % VA CREA: 1.0000 | TOPICAL_CREAM | Freq: Every day | VAGINAL | Status: DC

## 2012-08-02 NOTE — Patient Instructions (Signed)
Monilial Vaginitis Vaginitis in a soreness, swelling and redness (inflammation) of the vagina and vulva. Monilial vaginitis is not a sexually transmitted infection. CAUSES  Yeast vaginitis is caused by yeast (candida) that is normally found in your vagina. With a yeast infection, the candida has overgrown in number to a point that upsets the chemical balance. SYMPTOMS   White, thick vaginal discharge.  Swelling, itching, redness and irritation of the vagina and possibly the lips of the vagina (vulva).  Burning or painful urination.  Painful intercourse. DIAGNOSIS  Things that may contribute to monilial vaginitis are:  Postmenopausal and virginal states.  Pregnancy.  Infections.  Being tired, sick or stressed, especially if you had monilial vaginitis in the past.  Diabetes. Good control will help lower the chance.  Birth control pills.  Tight fitting garments.  Using bubble bath, feminine sprays, douches or deodorant tampons.  Taking certain medications that kill germs (antibiotics).  Sporadic recurrence can occur if you become ill. TREATMENT  Your caregiver will give you medication.  There are several kinds of anti monilial vaginal creams and suppositories specific for monilial vaginitis. For recurrent yeast infections, use a suppository or cream in the vagina 2 times a week, or as directed.  Anti-monilial or steroid cream for the itching or irritation of the vulva may also be used. Get your caregiver's permission.  Painting the vagina with methylene blue solution may help if the monilial cream does not work.  Eating yogurt may help prevent monilial vaginitis. HOME CARE INSTRUCTIONS   Finish all medication as prescribed.  Do not have sex until treatment is completed or after your caregiver tells you it is okay.  Take warm sitz baths.  Do not douche.  Do not use tampons, especially scented ones.  Wear cotton underwear.  Avoid tight pants and panty  hose.  Tell your sexual partner that you have a yeast infection. They should go to their caregiver if they have symptoms such as mild rash or itching.  Your sexual partner should be treated as well if your infection is difficult to eliminate.  Practice safer sex. Use condoms.  Some vaginal medications cause latex condoms to fail. Vaginal medications that harm condoms are:  Cleocin cream.  Butoconazole (Femstat).  Terconazole (Terazol) vaginal suppository.  Miconazole (Monistat) (may be purchased over the counter). SEEK MEDICAL CARE IF:   You have a temperature by mouth above 102 F (38.9 C).  The infection is getting worse after 2 days of treatment.  The infection is not getting better after 3 days of treatment.  You develop blisters in or around your vagina.  You develop vaginal bleeding, and it is not your menstrual period.  You have pain when you urinate.  You develop intestinal problems.  You have pain with sexual intercourse. Document Released: 05/28/2005 Document Revised: 11/10/2011 Document Reviewed: 02/09/2009 ExitCare Patient Information 2013 [employer].  

## 2012-08-02 NOTE — Progress Notes (Signed)
Last Pap: 06/2011  Due 2015 WNL: Yes Regular Periods:yes Contraception: n/a  Monthly Breast exam:yes Tetanus<41yrs:yes Nl.Bladder Function:yes Daily BMs:yes Healthy Diet:yes Calcium:yes Mammogram:yes Date of Mammogram: 2012 wnl per pt Exercise:yes Have often Exercise: walk Seatbelt: yes Abuse at home: no Stressful work:no Sigmoid-colonoscopy: n/a Bone Density: No PCP: Dr. Perrin Maltese Change in PMH: no change  Change in FMH:no change BP 100/60  Ht 5\' 4"  (1.626 m)  Wt 132 lb (59.875 kg)  BMI 22.66 kg/m2  LMP 07/10/2012 Pt with complaints:yes and she has burning when she urinates and also has a white discharge with some itching.   Physical Examination: General appearance - alert, well appearing, and in no distress Mental status - normal mood, behavior, speech, dress, motor activity, and thought processes Neck - supple, no significant adenopathy,  thyroid exam: thyroid is normal in size without nodules or tenderness Chest - clear to auscultation, no wheezes, rales or rhonchi, symmetric air entry Heart - normal rate and regular rhythm Abdomen - soft, nontender, nondistended, no masses or organomegaly Breasts - breasts appear normal, no suspicious masses, no skin or nipple changes or axillary nodes Pelvic - normal external genitalia, vulva, vagina, cervix, uterus and adnexa Rectal - rectal exam not indicated Back exam - full range of motion, no tenderness, palpable spasm or pain on motion Neurological - alert, oriented, normal speech, no focal findings or movement disorder noted Musculoskeletal - no joint tenderness, deformity or swelling Extremities - no edema, redness or tenderness in the calves or thighs Skin - normal coloration and turgor, no rashes, no suspicious skin lesions noted Routine exam Pap sent no due 2015 Mammogram due no vasectomy used for contraception RT 1 yr Wet prep c/w yeast.  terazol rx given

## 2012-08-03 ENCOUNTER — Ambulatory Visit (INDEPENDENT_AMBULATORY_CARE_PROVIDER_SITE_OTHER): Payer: BC Managed Care – PPO | Admitting: *Deleted

## 2012-08-03 ENCOUNTER — Other Ambulatory Visit: Payer: Self-pay

## 2012-08-03 DIAGNOSIS — Z7901 Long term (current) use of anticoagulants: Secondary | ICD-10-CM

## 2012-08-03 DIAGNOSIS — I359 Nonrheumatic aortic valve disorder, unspecified: Secondary | ICD-10-CM

## 2012-08-03 DIAGNOSIS — Z952 Presence of prosthetic heart valve: Secondary | ICD-10-CM

## 2012-08-03 DIAGNOSIS — Z954 Presence of other heart-valve replacement: Secondary | ICD-10-CM

## 2012-08-03 DIAGNOSIS — I4891 Unspecified atrial fibrillation: Secondary | ICD-10-CM

## 2012-08-03 LAB — URINE CULTURE
Colony Count: NO GROWTH
Organism ID, Bacteria: NO GROWTH

## 2012-08-03 MED ORDER — TERCONAZOLE 0.4 % VA CREA: 1.0000 | TOPICAL_CREAM | Freq: Every day | VAGINAL | Status: DC

## 2012-08-23 ENCOUNTER — Other Ambulatory Visit: Payer: Self-pay

## 2012-08-23 MED ORDER — WARFARIN SODIUM 4 MG PO TABS: ORAL_TABLET | ORAL | Status: DC

## 2012-09-07 ENCOUNTER — Ambulatory Visit (INDEPENDENT_AMBULATORY_CARE_PROVIDER_SITE_OTHER): Payer: BC Managed Care – PPO

## 2012-09-07 DIAGNOSIS — Z7901 Long term (current) use of anticoagulants: Secondary | ICD-10-CM

## 2012-09-07 DIAGNOSIS — Z952 Presence of prosthetic heart valve: Secondary | ICD-10-CM

## 2012-09-07 DIAGNOSIS — Z954 Presence of other heart-valve replacement: Secondary | ICD-10-CM

## 2012-09-07 DIAGNOSIS — I4891 Unspecified atrial fibrillation: Secondary | ICD-10-CM

## 2012-09-07 DIAGNOSIS — I359 Nonrheumatic aortic valve disorder, unspecified: Secondary | ICD-10-CM

## 2012-10-07 ENCOUNTER — Ambulatory Visit (INDEPENDENT_AMBULATORY_CARE_PROVIDER_SITE_OTHER): Payer: BC Managed Care – PPO | Admitting: *Deleted

## 2012-10-07 DIAGNOSIS — Z952 Presence of prosthetic heart valve: Secondary | ICD-10-CM

## 2012-10-07 DIAGNOSIS — I4891 Unspecified atrial fibrillation: Secondary | ICD-10-CM

## 2012-10-07 DIAGNOSIS — I359 Nonrheumatic aortic valve disorder, unspecified: Secondary | ICD-10-CM

## 2012-10-07 DIAGNOSIS — Z954 Presence of other heart-valve replacement: Secondary | ICD-10-CM

## 2012-10-07 DIAGNOSIS — Z7901 Long term (current) use of anticoagulants: Secondary | ICD-10-CM

## 2012-10-07 LAB — POCT INR: INR: 2.5

## 2012-12-15 ENCOUNTER — Ambulatory Visit (INDEPENDENT_AMBULATORY_CARE_PROVIDER_SITE_OTHER): Payer: BC Managed Care – PPO | Admitting: *Deleted

## 2012-12-15 DIAGNOSIS — I359 Nonrheumatic aortic valve disorder, unspecified: Secondary | ICD-10-CM

## 2012-12-15 DIAGNOSIS — I4891 Unspecified atrial fibrillation: Secondary | ICD-10-CM

## 2012-12-15 DIAGNOSIS — Z954 Presence of other heart-valve replacement: Secondary | ICD-10-CM

## 2012-12-15 DIAGNOSIS — Z7901 Long term (current) use of anticoagulants: Secondary | ICD-10-CM

## 2012-12-15 DIAGNOSIS — Z952 Presence of prosthetic heart valve: Secondary | ICD-10-CM

## 2012-12-15 LAB — POCT INR: INR: 2.6

## 2013-01-12 ENCOUNTER — Ambulatory Visit (INDEPENDENT_AMBULATORY_CARE_PROVIDER_SITE_OTHER): Payer: BC Managed Care – PPO | Admitting: Family Medicine

## 2013-01-12 ENCOUNTER — Ambulatory Visit (INDEPENDENT_AMBULATORY_CARE_PROVIDER_SITE_OTHER): Payer: BC Managed Care – PPO | Admitting: *Deleted

## 2013-01-12 VITALS — BP 98/65 | HR 76 | Temp 98.5°F | Resp 16 | Ht 64.0 in | Wt 132.0 lb

## 2013-01-12 DIAGNOSIS — I359 Nonrheumatic aortic valve disorder, unspecified: Secondary | ICD-10-CM

## 2013-01-12 DIAGNOSIS — K219 Gastro-esophageal reflux disease without esophagitis: Secondary | ICD-10-CM

## 2013-01-12 DIAGNOSIS — Z954 Presence of other heart-valve replacement: Secondary | ICD-10-CM

## 2013-01-12 DIAGNOSIS — R1011 Right upper quadrant pain: Secondary | ICD-10-CM

## 2013-01-12 DIAGNOSIS — I4891 Unspecified atrial fibrillation: Secondary | ICD-10-CM

## 2013-01-12 DIAGNOSIS — Z7901 Long term (current) use of anticoagulants: Secondary | ICD-10-CM

## 2013-01-12 DIAGNOSIS — Z952 Presence of prosthetic heart valve: Secondary | ICD-10-CM

## 2013-01-12 LAB — POCT URINALYSIS DIPSTICK
Bilirubin, UA: NEGATIVE
Ketones, UA: NEGATIVE
pH, UA: 6

## 2013-01-12 LAB — POCT CBC
HCT, POC: 37.5 % — AB (ref 37.7–47.9)
Hemoglobin: 11.6 g/dL — AB (ref 12.2–16.2)
Lymph, poc: 2.2 (ref 0.6–3.4)
MCH, POC: 27.3 pg (ref 27–31.2)
MCHC: 30.9 g/dL — AB (ref 31.8–35.4)
POC Granulocyte: 2.3 (ref 2–6.9)
WBC: 4.9 10*3/uL (ref 4.6–10.2)

## 2013-01-12 MED ORDER — OMEPRAZOLE 20 MG PO CPDR: 20.0000 mg | DELAYED_RELEASE_CAPSULE | Freq: Every day | ORAL | Status: DC

## 2013-01-12 NOTE — Patient Instructions (Addendum)
[name], [name]  (Gastroesophageal Reflux Diseaes, Adult)  [name] (GERD) x?y ra khi axit t? d? dy tro ln th?c Chang. Khi axit ti?p xc v?i th?c Chang, axit gy ra ?au (vim) trong th?c Chang. Theo th?i gian, GERD c th? t?o ra cc l? nh? (lot) ? nim m?c th?c Chang. NGUYN NHN   Tr?ng l??ng c? th? t?ng. ?i?u ny ??t p l?c ln d? dy, lm t?ng axit t? d? dy vo th?c Chang.  Ht thu?c l. Ht thu?c l lm t?ng l??ng axit ???c s?n sinh trong d? dy.  U?ng r??u. U?ng r??u lm gi?m p l?c trong c? th?t th?c Chang d??i (van ho?c vnh c?a c? gi?a th?c Chang v d? dy), cho php axit t? d? dy vo th?c Chang.  ?n t?i mu?n v b?ng no. Tnh tr?ng ny lm t?ng p l?c c?ng nh? l??ng axit ???c s?n sinh trong d? dy.  D? t?t c? th?t th?c Chang d??i. ?i khi khng tm th?y nguyn nhn.  TRI?U CH?NG   ?au rt ? ph?n d??i gi?a ng?c pha sau x??ng ?c v trong khu v?c gi?a d? dy. Hi?n t??ng ny c th? x?y ra hai Chang m?t tu?n ho?c th??ng xuyn h?n.  Kh nu?t.  ?au h?ng.  Ho khan.  Cc tri?u ch?ng gi?ng hen suy?n, bao g?m t?c ng?c, th? d?c ho?c th? kh kh. CH?N ?ON  Chuyn gia ch?m sc y t? c th? ch?n ?on GERD d?a trn cc tri?u ch?ng c?a b?n. Trong m?t s? tr??ng h?p, ch?p X quang v cc xt nghi?m khc c th? ???c ti?n hnh ?? ki?m tra cc bi?n ch?ng ho?c tnh tr?ng c?a d? dy v th?c Chang.  ?I?U TR?  Chuyn gia ch?m sc y t? c th? ?? xu?t thu?c mua tr?c ti?p t?i hi?u thu?c ho?c thu?c theo toa ?? gip gi?m l??ng axit ???c s?n sinh. Hy h?i chuyn gia ch?m sc y t? c?a b?n tr??c khi b?t ??u ho?c thm b?t k? lo?i thu?c m?i no.  H??NG D?N CH?M SC T?I NH   Thay ??i cc y?u t? m b?n c th? ki?m sot. H?i chuyn gia ch?m sc y t? ?? ???c h??ng d?n v? vi?c gi?m cn, b? thu?c l v r??u.  Trnh cc lo?i th?c ph?m v ?? u?ng lm cho cc tri?u ch?ng t?i t? h?n, ch?ng h?n nh?:  Caffeine ho?c ?? u?ng c c?n.  S c la.  B?c h ho?c v? b?c  h.  T?i v hnh ty.  Th?c ?n cay.  Tri cy h? cam, ch?ng h?n nh? cam, chanh hay chanh ty.  Cc th?c ?n trn c? s? c chua, ch?ng h?n nh? n??c x?t, ?t, salsa (n??c x?t cay) v bnh pizza.  Cc lo?i th?c ?n chin xo v nhi?u ch?t bo.  Trnh n?m xu?ng ng? 3 ti?ng tr??c gi? ?i ng? ho?c tr??c khi c m?t gi?c ng? ng?n.  ?n nh?ng b?a ?n nh?, th??ng xuyn h?n thay v cc b?a ?n Chang.  M?c Chang o r?ng. Khng ?eo b?t c? th? g ch?t quanh th?t l?ng gy p l?c ln d? dy.  Nng ??u gi??ng cao ln t? 6 ??n 8 inch b?ng cc kh?i g? ?? gip b?n ng?. S? d?ng thm g?i s? khng c tc d?ng.  Ch? s? d?ng thu?c mua tr?c ti?p t?i hi?u thu?c ho?c thu?c theo toa ?? gi?m ?au, gi?m s? kh ch?u ho?c h? s?t theo ch?   d?n c?a chuyn gia ch?m sc y t? c?a b?n.  Khng dng thu?c atpirin, ibuprofen ho?c cc thu?c ch?ng vim khng c steroid (NSAID) khc. HY NGAY L?P T?C THAM V?N V?I CHUYN GIA Y T? N?U:   B?n b? ?au ? cnh tay, c?, hm, r?ng ho?c l?ng.  Hi?n t??ng ?au t?ng ln ho?c thay ??i theo c??ng ?? ho?c th?i gian.  B?n b? bu?n nn, nn ho?c ?? m? hi (tot m? hi).  B?n b? th? d?c ho?c ng?t x?u.  Ch?t nn c mu xanh l cy, vng, ?en ho?c trng gi?ng nh? b c ph ho?c mu.  Phn c mu ??, ?? nh? mu ho?c ?en. Nh?ng tri?u ch?ng ny c th? l d?u hi?u c?a cc v?n ?? khc, ch?ng h?n nh? Angela tim, ch?y mu d? dy ho?c ch?y mu th?c Chang.  ??M B?O B?N:   Hi?u cc h??ng d?n ny.  S? theo di tnh tr?ng c?a mnh.  S? yu c?u tr? gip ngay l?p t?c n?u b?n c?m th?y khng kh?e ho?c tnh tr?ng tr? nn t?i h?n. Document Released: 05/28/2005 Document Revised: 11/10/2011 Exit[employer] Patient Information 2013 [employer].  

## 2013-01-12 NOTE — Progress Notes (Signed)
[address]   [place], [place]  [address]   [phone]  Subjective:    Patient ID: [name], female    DOB: 1971/06/26, 42 y.o.   MRN: [id number]  HPI This 42 y.o. female presents for evaluation of RUQ pain.  Onset ten days ago.  Location of pain RUQ pain and epigastric pain.  +reflux and belching.  No nausea, vomiting, diarrhea, constipation.   Appetite good. No weight loss.  Occurs after eating; has also occurred on empty stomach.  Took Pepcid started four days ago; taking one daily.  No dysuria, hematuria, frequency.  No fever.  PCP:  [facility]  Review of Systems  Constitutional: Negative for fever, chills, diaphoresis, activity change, appetite change, fatigue and unexpected weight change.  Respiratory: Negative for shortness of breath, wheezing and stridor.   Cardiovascular: Negative for chest pain, palpitations and leg swelling.  Gastrointestinal: Positive for abdominal pain. Negative for nausea, vomiting, diarrhea, constipation, blood in stool, abdominal distention, anal bleeding and rectal pain.  Genitourinary: Negative for dysuria, urgency, frequency, hematuria and flank pain.        Past Medical History  Diagnosis Date  . Severe aortic insufficiency   . Femur fracture, left 1990    with open reduction and internal fixation   . H/O: rheumatic fever   . History of allergic rhinitis   . Hepatitis B carrier     positive hepatitis B carrier    Past Surgical History  Procedure Laterality Date  . Aortic valve replacement  4/06    21mm On-X mechanical valve  . Hemorrhoid surgery    . Cardiac catheterization  11/15/2004    Ejection fraction is estimated 55%    Prior to Admission medications   Medication Sig Start Date End Date Taking? Authorizing Provider  entecavir (BARACLUDE) 1 MG tablet Take 1 mg by mouth daily.   Yes Historical Provider, MD  warfarin (COUMADIN) 4 MG tablet Take as directed by anticoagulation clinic 08/23/12  Yes Peter M Swaziland, MD  terconazole  (TERAZOL 7) 0.4 % vaginal cream Place 1 applicator vaginally at bedtime. 08/03/12   Naima A Dillard, MD    No Known Allergies  History   Social History  . Marital Status: Married    Spouse Name: N/A    Number of Children: 3  . Years of Education: N/A   Occupational History  . manicure    Social History Main Topics  . Smoking status: Never Smoker   . Smokeless tobacco: Not on file  . Alcohol Use: No  . Drug Use: No  . Sexually Active:    Other Topics Concern  . Not on file   Social History Narrative  . No narrative on file    Family History  Problem Relation Age of Onset  . Heart disease Brother     Objective:   Physical Exam  Nursing note and vitals reviewed. Constitutional: She is oriented to person, place, and time. She appears well-developed and well-nourished. No distress.  HENT:  Head: Normocephalic and atraumatic.  Mouth/Throat: Oropharynx is clear and moist.  Eyes: Conjunctivae are normal. Pupils are equal, round, and reactive to light.  Neck: Normal range of motion. Neck supple. No JVD present. No thyromegaly present.  Cardiovascular: Normal rate, regular rhythm and normal heart sounds.   +click  Pulmonary/Chest: Effort normal and breath sounds normal. She has no wheezes. She has no rales.  Abdominal: Soft. Bowel sounds are normal. She exhibits no distension and no mass. There is  no tenderness. There is no rebound and no guarding.  Lymphadenopathy:    She has no cervical adenopathy.  Neurological: She is alert and oriented to person, place, and time.  Skin: Skin is warm and dry. She is not diaphoretic.  Psychiatric: She has a normal mood and affect. Her behavior is normal.      Results for orders placed in visit on 01/12/13  POCT CBC      Result Value Range   WBC 4.9  4.6 - 10.2 K/uL   Lymph, poc 2.2  0.6 - 3.4   POC LYMPH PERCENT 44.0  10 - 50 %L   MID (cbc) 0.5  0 - 0.9   POC MID % 9.4  0 - 12 %M   POC Granulocyte 2.3  2 - 6.9   Granulocyte  percent 46.6  37 - 80 %G   RBC 4.25  4.04 - 5.48 M/uL   Hemoglobin 11.6 (*) 12.2 - 16.2 g/dL   HCT, POC 47.8 (*) 29.5 - 47.9 %   MCV 88.2  80 - 97 fL   MCH, POC 27.3  27 - 31.2 pg   MCHC 30.9 (*) 31.8 - 35.4 g/dL   RDW, POC 62.1     Platelet Count, POC 268  142 - 424 K/uL   MPV 8.7  0 - 99.8 fL  POCT URINALYSIS DIPSTICK      Result Value Range   Color, UA light yellow     Clarity, UA clear     Glucose, UA neg     Bilirubin, UA neg     Ketones, UA neg     Spec Grav, UA <=1.005     Blood, UA neg     pH, UA 6.0     Protein, UA neg     Urobilinogen, UA 0.2     Nitrite, UA neg     Leukocytes, UA Trace         Assessment & Plan:  Abdominal pain, RUQ - Plan: POCT CBC, POCT urinalysis dipstick, Comprehensive metabolic panel, omeprazole (PRILOSEC) 20 MG capsule, US Abdomen Complete  GERD (gastroesophageal reflux disease) - Plan: omeprazole (PRILOSEC) 20 MG capsule   1.  Abdominal pain RUQ and epigastric region:  New onset ten days ago; rx for Prilosec 20mg  one daily provided.  Dietary modification reviewed in detail.  Obtain abdominal u/s to evaluate for gallstones.  Obtain labs.  Ddx includes gastritis, GERD, PUD, cholelithiasis/cholecystitis. 2.  GERD:  New.  Rx for Prilosec provided.  Dietary modification reviewed.  RTC if no improvement in one month; advised to take medication for one month and then attempt to wean.  Meds ordered this encounter  Medications  . omeprazole (PRILOSEC) 20 MG capsule    Sig: Take 1 capsule (20 mg total) by mouth daily.    Dispense:  30 capsule    Refill:  3

## 2013-01-13 LAB — COMPREHENSIVE METABOLIC PANEL
ALT: 11 U/L (ref 0–35)
Albumin: 4.3 g/dL (ref 3.5–5.2)
CO2: 24 mEq/L (ref 19–32)
Calcium: 8.9 mg/dL (ref 8.4–10.5)
Chloride: 107 mEq/L (ref 96–112)
Creat: 0.65 mg/dL (ref 0.50–1.10)
Potassium: 4.2 mEq/L (ref 3.5–5.3)
Total Protein: 7.4 g/dL (ref 6.0–8.3)

## 2013-01-19 ENCOUNTER — Ambulatory Visit
Admission: RE | Admit: 2013-01-19 | Discharge: 2013-01-19 | Disposition: A | Discharge status: Home / Self Care | Payer: BC Managed Care – PPO | Source: Ambulatory Visit | Attending: Family Medicine | Admitting: Family Medicine

## 2013-01-19 DIAGNOSIS — R1011 Right upper quadrant pain: Secondary | ICD-10-CM

## 2013-02-23 ENCOUNTER — Ambulatory Visit (INDEPENDENT_AMBULATORY_CARE_PROVIDER_SITE_OTHER): Payer: BC Managed Care – PPO | Admitting: *Deleted

## 2013-02-23 DIAGNOSIS — I4891 Unspecified atrial fibrillation: Secondary | ICD-10-CM

## 2013-02-23 DIAGNOSIS — I359 Nonrheumatic aortic valve disorder, unspecified: Secondary | ICD-10-CM

## 2013-02-23 DIAGNOSIS — Z7901 Long term (current) use of anticoagulants: Secondary | ICD-10-CM

## 2013-02-23 DIAGNOSIS — Z954 Presence of other heart-valve replacement: Secondary | ICD-10-CM

## 2013-02-23 DIAGNOSIS — Z952 Presence of prosthetic heart valve: Secondary | ICD-10-CM

## 2013-02-23 LAB — POCT INR: INR: 2.8

## 2013-03-09 ENCOUNTER — Other Ambulatory Visit: Payer: Self-pay | Admitting: Internal Medicine

## 2013-03-09 DIAGNOSIS — C220 Liver cell carcinoma: Secondary | ICD-10-CM

## 2013-03-13 ENCOUNTER — Ambulatory Visit (INDEPENDENT_AMBULATORY_CARE_PROVIDER_SITE_OTHER): Payer: BC Managed Care – PPO | Admitting: Family Medicine

## 2013-03-13 VITALS — BP 102/70 | HR 88 | Temp 98.6°F | Resp 18 | Ht 63.0 in | Wt 130.0 lb

## 2013-03-13 DIAGNOSIS — R059 Cough, unspecified: Secondary | ICD-10-CM

## 2013-03-13 DIAGNOSIS — J069 Acute upper respiratory infection, unspecified: Secondary | ICD-10-CM

## 2013-03-13 DIAGNOSIS — R05 Cough: Secondary | ICD-10-CM

## 2013-03-13 MED ORDER — BENZONATATE 100 MG PO CAPS: 100.0000 mg | ORAL_CAPSULE | Freq: Three times a day (TID) | ORAL | Status: DC | PRN

## 2013-03-13 MED ORDER — HYDROCODONE-HOMATROPINE 5-1.5 MG/5ML PO SYRP: ORAL_SOLUTION | ORAL | Status: DC

## 2013-03-13 MED ORDER — AZITHROMYCIN 250 MG PO TABS: ORAL_TABLET | ORAL | Status: DC

## 2013-03-13 NOTE — Progress Notes (Signed)
Subjective:    Patient ID: [name], female    DOB: [birth year], 42 y.o.   MRN: [id number]  HPI [name] is a 42 y.o. female Hx of afib, aortic valve replacement - takes coumadin. Other per problem list below.   Cough - started 4 days ago with dry cough, itchy throat.  Treated with mucinex. More mucus - past 2 days.  Yellow mucus. Sinus congestion today. Cough is main problem, including keeping up at night.  No fever. No shortness of breath.    No hx of asthma/copd.   Leaving for trip to [place] for a week tomorrow.     Past Medical History  Diagnosis Date  . Severe aortic insufficiency   . Femur fracture, left 1990    with open reduction and internal fixation   . H/O: rheumatic fever   . History of allergic rhinitis   . Hepatitis B carrier     positive hepatitis B carrier   Patient Active Problem List   Diagnosis Date Noted  . Hepatitis B 04/28/2012  . Aortic valve disorders 02/11/2012  . Encounter for long-term (current) use of anticoagulants 02/11/2012  . S/P AVR 12/16/2011  . Chronic anticoagulation 12/16/2011  . PULMONARY NODULE 01/31/2010  . DYSPNEA 01/31/2010   Past Surgical History  Procedure Laterality Date  . Aortic valve replacement  4/06    21mm On-X mechanical valve  . Hemorrhoid surgery    . Cardiac catheterization  11/15/2004    Ejection fraction is estimated 55%   No Known Allergies Prior to Admission medications   Medication Sig Start Date End Date Taking? Authorizing Provider  entecavir (BARACLUDE) 1 MG tablet Take 1 mg by mouth daily.   Yes Historical Provider, MD  omeprazole (PRILOSEC) 20 MG capsule Take 1 capsule (20 mg total) by mouth daily. 01/12/13  Yes Ethelda Chick, MD  warfarin (COUMADIN) 4 MG tablet Take as directed by anticoagulation clinic 08/23/12  Yes Peter M [place], MD   History   Social History  . Marital Status: Married    Spouse Name: N/A    Number of Children: 3  . Years of Education: N/A   Occupational History  .  manicure    Social History Main Topics  . Smoking status: Never Smoker   . Smokeless tobacco: Not on file  . Alcohol Use: No  . Drug Use: No  . Sexually Active:    Other Topics Concern  . Not on file   Social History Narrative  . No narrative on file     Review of Systems  Constitutional: Negative for fever and chills.  HENT: Positive for congestion and sinus pressure.   Respiratory: Positive for cough. Negative for shortness of breath.   Skin: Negative for rash.       Objective:   Physical Exam  Vitals reviewed. Constitutional: She is oriented to person, place, and time. She appears well-developed and well-nourished. No distress.  HENT:  Head: Normocephalic and atraumatic.  Right Ear: Hearing, tympanic membrane, external ear and ear canal normal.  Left Ear: Hearing, tympanic membrane, external ear and ear canal normal.  Nose: Nose normal.  Mouth/Throat: Oropharynx is clear and moist. No oropharyngeal exudate.  Eyes: Conjunctivae and EOM are normal. Pupils are equal, round, and reactive to light.  Cardiovascular: Normal rate, regular rhythm, normal heart sounds and intact distal pulses.   No murmur heard. Pulmonary/Chest: Effort normal and breath sounds normal. No respiratory distress. She has no wheezes. She has no rhonchi.  Neurological: She is alert and oriented to person, place, and time.  Skin: Skin is warm and dry. No rash noted.  Psychiatric: She has a normal mood and affect. Her behavior is normal.       Assessment & Plan:  [name] is a 42 y.o. female Acute upper respiratory infections of unspecified site  Cough - Plan: HYDROcodone-homatropine (HYCODAN) 5-1.5 MG/5ML syrup, azithromycin (ZITHROMAX) 250 MG tablet, benzonatate (TESSALON) 100 MG capsule  Suspected viral URI, sinus congestion and cough.  Discussed sx care as below,  mucinex DM or tessalon, hycodan qhs prn.  If not improving in 4-5 days - rx z pak, but would need to call coumadin clinic to  discuss testing and dose changes. Rtc/er precautions. Understanding expressed.   Meds ordered this encounter  Medications  . HYDROcodone-homatropine (HYCODAN) 5-1.5 MG/5ML syrup    Sig: 68m by mouth a bedtime as needed for cough.    Dispense:  120 mL    Refill:  0  . azithromycin (ZITHROMAX) 250 MG tablet    Sig: Take 2 pills by mouth on day 1, then 1 pill by mouth per day on days 2 through 5.    Dispense:  6 each    Refill:  0  . benzonatate (TESSALON) 100 MG capsule    Sig: Take 1 capsule (100 mg total) by mouth 3 (three) times daily as needed for cough.    Dispense:  20 capsule    Refill:  0   Patient Instructions  Saline nasal spray atleast 4 times per day for nasal congestion, over the counter mucinex or mucinex DM for cough during the day OR tessalon perles. Cough syrup at night if needed (hydrocodone). Your symptoms appear to be from a virus at this point, and for this reason as well as the fact you are on Coumadin - would not recommend starting antibiotic at this point.  If you are not starting to improve in the next 4-5 days - can start antibiotic (azithromycin), but call your Coumadin clinic to discuss follow up and possible changes to your regimen as the antibiotic can change your levels. Return to the clinic or go to the nearest emergency room if any of your symptoms worsen or new symptoms occur.

## 2013-03-13 NOTE — Patient Instructions (Signed)
Saline nasal spray atleast 4 times per day for nasal congestion, over the counter mucinex or mucinex DM for cough during the day OR tessalon perles. Cough syrup at night if needed (hydrocodone). Your symptoms appear to be from a virus at this point, and for this reason as well as the fact you are on Coumadin - would not recommend starting antibiotic at this point.  If you are not starting to improve in the next 4-5 days - can start antibiotic (azithromycin), but call your Coumadin clinic to discuss follow up and possible changes to your regimen as the antibiotic can change your levels. Return to the clinic or go to the nearest emergency room if any of your symptoms worsen or new symptoms occur.

## 2013-03-22 ENCOUNTER — Ambulatory Visit: Payer: BC Managed Care – PPO | Admitting: Cardiology

## 2013-03-28 ENCOUNTER — Ambulatory Visit (INDEPENDENT_AMBULATORY_CARE_PROVIDER_SITE_OTHER): Payer: BC Managed Care – PPO | Admitting: Pharmacist

## 2013-03-28 ENCOUNTER — Ambulatory Visit (INDEPENDENT_AMBULATORY_CARE_PROVIDER_SITE_OTHER): Payer: BC Managed Care – PPO | Admitting: Cardiology

## 2013-03-28 ENCOUNTER — Encounter: Payer: Self-pay | Admitting: Cardiology

## 2013-03-28 VITALS — BP 110/74 | HR 72 | Ht 65.0 in | Wt 131.0 lb

## 2013-03-28 DIAGNOSIS — I359 Nonrheumatic aortic valve disorder, unspecified: Secondary | ICD-10-CM

## 2013-03-28 DIAGNOSIS — Z7901 Long term (current) use of anticoagulants: Secondary | ICD-10-CM

## 2013-03-28 DIAGNOSIS — I4891 Unspecified atrial fibrillation: Secondary | ICD-10-CM

## 2013-03-28 DIAGNOSIS — Z954 Presence of other heart-valve replacement: Secondary | ICD-10-CM

## 2013-03-28 DIAGNOSIS — Z952 Presence of prosthetic heart valve: Secondary | ICD-10-CM

## 2013-03-28 LAB — POCT INR: INR: 2.2

## 2013-03-28 NOTE — Progress Notes (Signed)
   [name] Date of Birth: 1971/02/01 Medical Record #[id number]  History of Present Illness: [name] is seen today for followup. She has a history of severe aortic insufficiency and underwent aortic valve replacement with a mechanical prosthesis in April of 2006. This was a #21 mm On-X valve. She does have some chronic dyspnea. She's had extensive pulmonary and cardiac evaluation for this which has been unremarkable. She is on chronic anticoagulation. She does have a history of hepatitis B and is being treated by Dr. Jacqualine Mau. Her major complaint today is of insomnia. She has taken melatonin without relief. She is considering getting a nose implant.  Current Outpatient Prescriptions on File Prior to Visit  Medication Sig Dispense Refill  . entecavir (BARACLUDE) 1 MG tablet Take 1 mg by mouth daily.      Marland Kitchen warfarin (COUMADIN) 4 MG tablet Take as directed by anticoagulation clinic  45 tablet  3   No current facility-administered medications on file prior to visit.    No Known Allergies  Past Medical History  Diagnosis Date  . Severe aortic insufficiency   . Femur fracture, left 1990    with open reduction and internal fixation   . H/O: rheumatic fever   . History of allergic rhinitis   . Hepatitis B carrier     positive hepatitis B carrier    Past Surgical History  Procedure Laterality Date  . Aortic valve replacement  4/06    21mm On-X mechanical valve  . Hemorrhoid surgery    . Cardiac catheterization  11/15/2004    Ejection fraction is estimated 55%    History  Smoking status  . Never Smoker   Smokeless tobacco  . Not on file    History  Alcohol Use No    Family History  Problem Relation Age of Onset  . Heart disease Brother     Review of Systems: As noted in history of present illness.  All other systems were reviewed and are negative.  Physical Exam: BP 110/74  Pulse 72  Ht 5\' 5"  (1.651 m)  Wt 131 lb (59.421 kg)  BMI 21.8 kg/m2  LMP 02/22/2013 She is a  pleasant, Asian female in no acute distress. HEENT exam is unremarkable. She is no JVD or bruits. Lungs are clear. Cardiac exam reveals a regular rate and rhythm without gallop. She has a good mechanical aortic valve click. Abdomen is soft and nontender. She has no significant edema. Skin is warm and dry. She is alert and oriented x3. Cranial nerves II through XII are intact. LABORATORY DATA: ECG today demonstrates normal sinus rhythm with a normal ECG. Rate is 67 beats per minute.  INR today is 2.2.  Assessment / Plan: 1. Status post mechanical aortic valve replacement for severe aortic insufficiency. Exam is stable. Continue anticoagulation and routine SBE prophylaxis. 2. Insomnia. I recommended trying Benadryl or Tylenol PM. 3. Hepatitis B.

## 2013-03-28 NOTE — Patient Instructions (Signed)
You can try Tylenol Pm or Benadryl 25 mg to help you sleep.  Continue your current therapy  I will see you in one year.

## 2013-05-11 ENCOUNTER — Ambulatory Visit (INDEPENDENT_AMBULATORY_CARE_PROVIDER_SITE_OTHER): Payer: BC Managed Care – PPO | Admitting: Pharmacist

## 2013-05-11 DIAGNOSIS — Z954 Presence of other heart-valve replacement: Secondary | ICD-10-CM

## 2013-05-11 DIAGNOSIS — Z7901 Long term (current) use of anticoagulants: Secondary | ICD-10-CM

## 2013-05-11 DIAGNOSIS — I4891 Unspecified atrial fibrillation: Secondary | ICD-10-CM

## 2013-05-11 DIAGNOSIS — I359 Nonrheumatic aortic valve disorder, unspecified: Secondary | ICD-10-CM

## 2013-05-11 DIAGNOSIS — Z952 Presence of prosthetic heart valve: Secondary | ICD-10-CM

## 2013-05-19 ENCOUNTER — Other Ambulatory Visit: Payer: Self-pay | Admitting: *Deleted

## 2013-05-19 MED ORDER — WARFARIN SODIUM 4 MG PO TABS: ORAL_TABLET | ORAL | Status: DC

## 2013-05-25 ENCOUNTER — Ambulatory Visit (INDEPENDENT_AMBULATORY_CARE_PROVIDER_SITE_OTHER): Payer: BC Managed Care – PPO

## 2013-05-25 ENCOUNTER — Ambulatory Visit (INDEPENDENT_AMBULATORY_CARE_PROVIDER_SITE_OTHER): Payer: BC Managed Care – PPO | Admitting: *Deleted

## 2013-05-25 DIAGNOSIS — I359 Nonrheumatic aortic valve disorder, unspecified: Secondary | ICD-10-CM

## 2013-05-25 DIAGNOSIS — Z954 Presence of other heart-valve replacement: Secondary | ICD-10-CM

## 2013-05-25 DIAGNOSIS — Z7901 Long term (current) use of anticoagulants: Secondary | ICD-10-CM

## 2013-05-25 DIAGNOSIS — Z952 Presence of prosthetic heart valve: Secondary | ICD-10-CM

## 2013-05-25 DIAGNOSIS — Z23 Encounter for immunization: Secondary | ICD-10-CM

## 2013-05-25 DIAGNOSIS — I4891 Unspecified atrial fibrillation: Secondary | ICD-10-CM

## 2013-07-06 ENCOUNTER — Ambulatory Visit (INDEPENDENT_AMBULATORY_CARE_PROVIDER_SITE_OTHER): Payer: BC Managed Care – PPO | Admitting: *Deleted

## 2013-07-06 ENCOUNTER — Ambulatory Visit (INDEPENDENT_AMBULATORY_CARE_PROVIDER_SITE_OTHER): Payer: BC Managed Care – PPO | Admitting: Family Medicine

## 2013-07-06 ENCOUNTER — Encounter: Payer: Self-pay | Admitting: Family Medicine

## 2013-07-06 VITALS — BP 116/70 | HR 78 | Temp 97.8°F | Resp 16 | Ht 63.0 in | Wt 130.8 lb

## 2013-07-06 DIAGNOSIS — R1013 Epigastric pain: Secondary | ICD-10-CM

## 2013-07-06 DIAGNOSIS — Z952 Presence of prosthetic heart valve: Secondary | ICD-10-CM

## 2013-07-06 DIAGNOSIS — Z954 Presence of other heart-valve replacement: Secondary | ICD-10-CM

## 2013-07-06 DIAGNOSIS — Z7901 Long term (current) use of anticoagulants: Secondary | ICD-10-CM

## 2013-07-06 DIAGNOSIS — I359 Nonrheumatic aortic valve disorder, unspecified: Secondary | ICD-10-CM

## 2013-07-06 DIAGNOSIS — D649 Anemia, unspecified: Secondary | ICD-10-CM

## 2013-07-06 DIAGNOSIS — Z1239 Encounter for other screening for malignant neoplasm of breast: Secondary | ICD-10-CM

## 2013-07-06 DIAGNOSIS — I4891 Unspecified atrial fibrillation: Secondary | ICD-10-CM

## 2013-07-06 DIAGNOSIS — R42 Dizziness and giddiness: Secondary | ICD-10-CM

## 2013-07-06 DIAGNOSIS — Z0000 Encounter for general adult medical examination without abnormal findings: Secondary | ICD-10-CM

## 2013-07-06 LAB — IBC PANEL
%SAT: 10 % — ABNORMAL LOW (ref 20–55)
TIBC: 378 ug/dL (ref 250–470)
UIBC: 342 ug/dL (ref 125–400)

## 2013-07-06 LAB — CBC WITH DIFFERENTIAL/PLATELET
Eosinophils Relative: 2 % (ref 0–5)
HCT: 36.5 % (ref 36.0–46.0)
MCHC: 33.2 g/dL (ref 30.0–36.0)
MCV: 80 fL (ref 78.0–100.0)
Monocytes Absolute: 0.5 10*3/uL (ref 0.1–1.0)
RDW: 17.3 % — ABNORMAL HIGH (ref 11.5–15.5)

## 2013-07-06 LAB — LIPID PANEL
Total CHOL/HDL Ratio: 3.6 Ratio
VLDL: 12 mg/dL (ref 0–40)

## 2013-07-06 LAB — COMPREHENSIVE METABOLIC PANEL
ALT: 11 U/L (ref 0–35)
Albumin: 4 g/dL (ref 3.5–5.2)
CO2: 25 mEq/L (ref 19–32)
Glucose, Bld: 83 mg/dL (ref 70–99)
Potassium: 4 mEq/L (ref 3.5–5.3)
Sodium: 137 mEq/L (ref 135–145)
Total Bilirubin: 0.6 mg/dL (ref 0.3–1.2)
Total Protein: 7.4 g/dL (ref 6.0–8.3)

## 2013-07-06 LAB — POCT URINALYSIS DIPSTICK
Bilirubin, UA: NEGATIVE
Ketones, UA: NEGATIVE
Spec Grav, UA: <=1.005

## 2013-07-06 LAB — IFOBT (OCCULT BLOOD): IFOBT: NEGATIVE

## 2013-07-06 LAB — HEMOGLOBIN A1C
Hgb A1c MFr Bld: 5.5 % (ref <5.7)
Mean Plasma Glucose: 111 mg/dL (ref <117)

## 2013-07-06 LAB — IRON: Iron: 36 ug/dL — ABNORMAL LOW (ref 42–145)

## 2013-07-06 MED ORDER — OMEPRAZOLE 20 MG PO CPDR: 20.0000 mg | DELAYED_RELEASE_CAPSULE | Freq: Every day | ORAL | Status: DC

## 2013-07-06 NOTE — Patient Instructions (Signed)
1.  Recommend taking Omeprazole 20mg  one tablet daily for stomach pain, indigestion.  2.  Please return to office if abdominal pain persists more than 3-4 weeks. 3.  Please stand up slowly due to dizziness.

## 2013-07-06 NOTE — Progress Notes (Signed)
[address]   [place], [place]  [address]   [phone]  Subjective:    Patient ID: [name], female    DOB: 1970/11/24, 42 y.o.   MRN: [id number]  HPI This 42 y.o. female presents for CPE.   Last physical by Dr. Patsy Lager. Pap smear 06/2013.  [place].  Regular menses (6 days, heavy, mild cramping).  Mammogram 2013. Colonoscopy never. TDAP 08/02/2007. Flu vaccine 05/25/13. Eye exam 2011. Dental exam every six months.  Epigastric pain:  New. Onset last night.  +nausea; mild regurgitation last night; no diarrhea, constipation, bloody stools, melena.  No fever/chills/sweats; no dysuria, urgency, frequency.    Dizziness:   Onset in past year; occurs every time with standing up. History in past of syncopal events x 2; no syncopal events in the past year.    Hepatitis B: maintained on Baraclude.  Followed by Hepatitis clinic every six months.  Review of Systems  Constitutional: Negative for fever, chills, diaphoresis, activity change, appetite change, fatigue and unexpected weight change.  HENT: Negative for congestion, ear pain, hearing loss, postnasal drip, rhinorrhea, sore throat, tinnitus, trouble swallowing and voice change.   Eyes: Positive for visual disturbance. Negative for photophobia.  Respiratory: Positive for shortness of breath. Negative for cough, wheezing and stridor.   Cardiovascular: Negative for chest pain, palpitations and leg swelling.  Gastrointestinal: Positive for nausea and abdominal pain. Negative for vomiting, diarrhea, constipation, blood in stool, abdominal distention, anal bleeding and rectal pain.  Endocrine: Negative for cold intolerance, heat intolerance, polydipsia, polyphagia and polyuria.  Genitourinary: Negative for dysuria, urgency, frequency, hematuria, flank pain, genital sores, vaginal pain, menstrual problem and pelvic pain.  Musculoskeletal: Negative for arthralgias, back pain, gait problem, joint swelling, myalgias, neck pain and neck stiffness.   Skin: Negative for color change, pallor, rash and wound.  Neurological: Positive for dizziness. Negative for tremors, seizures, syncope, facial asymmetry, speech difficulty, weakness, light-headedness, numbness and headaches.  Psychiatric/Behavioral: Negative for suicidal ideas, self-injury, dysphoric mood and decreased concentration. The patient is not nervous/anxious.    Past Medical History  Diagnosis Date  . Severe aortic insufficiency 09/01/2004    s/p aortic valve replacement.    . Femur fracture, left 1990    with open reduction and internal fixation   . H/O: rheumatic fever   . History of allergic rhinitis   . Hepatitis B carrier     positive hepatitis B carrier; maintained on Baraclude; followed by Hepatitis clinic.   Past Surgical History  Procedure Laterality Date  . Aortic valve replacement  4/06    21mm On-X mechanical valve  . Hemorrhoid surgery    . Cardiac catheterization  11/15/2004    Ejection fraction is estimated 55%   No Known Allergies Current Outpatient Prescriptions on File Prior to Visit  Medication Sig Dispense Refill  . entecavir (BARACLUDE) 1 MG tablet Take 1 mg by mouth daily.       No current facility-administered medications on file prior to visit.   History   Social History  . Marital Status: Married    Spouse Name: N/A    Number of Children: 3  . Years of Education: N/A   Occupational History  . manicure    Social History Main Topics  . Smoking status: Never Smoker   . Smokeless tobacco: Not on file  . Alcohol Use: No  . Drug Use: No  . Sexual Activity: Not on file   Other Topics Concern  . Not on file   Social  History Narrative   Marital status: married x 18 years; happily married; no abuse.  From [place]; [place] since 1993.      Children: 3 boys (17, 65, 54)      Lives: with husband, 3 sons      Employment:  Forensic scientist at [employer].      Tobacco: none      Alcohol: none      Drugs: none      Exercise: walking x 3-4 days; 30  minutes   Family History  Problem Relation Age of Onset  . Heart disease Brother     valvular dysfunction; s/p replacement  . Hypertension Mother   . Hyperlipidemia Mother   . Hypothyroidism Mother   . Heart disease Father   . Hypertension Father   . Hyperlipidemia Father        Objective:   Physical Exam  Nursing note and vitals reviewed. Constitutional: She is oriented to person, place, and time. She appears well-developed and well-nourished. No distress.  HENT:  Head: Normocephalic and atraumatic.  Right Ear: External ear normal.  Left Ear: External ear normal.  Nose: Nose normal.  Mouth/Throat: Oropharynx is clear and moist.  Eyes: Conjunctivae and EOM are normal. Pupils are equal, round, and reactive to light.  Neck: Normal range of motion and full passive range of motion without pain. Neck supple. No JVD present. Carotid bruit is not present. No thyromegaly present.  Cardiovascular: Normal rate, regular rhythm and normal heart sounds.  Exam reveals no gallop and no friction rub.   No murmur heard. Pulmonary/Chest: Effort normal and breath sounds normal. She has no wheezes. She has no rales.  Abdominal: Soft. Bowel sounds are normal. She exhibits no distension and no mass. There is no tenderness. There is no rebound and no guarding.  Genitourinary: Rectum normal.  Musculoskeletal:       Right shoulder: Normal.       Left shoulder: Normal.       Cervical back: Normal.  Lymphadenopathy:    She has no cervical adenopathy.  Neurological: She is alert and oriented to person, place, and time. She has normal reflexes. No cranial nerve deficit. She exhibits normal muscle tone. Coordination normal.  Skin: Skin is warm and dry. No rash noted. She is not diaphoretic. No erythema. No pallor.  Psychiatric: She has a normal mood and affect. Her behavior is normal. Judgment and thought content normal.       Assessment & Plan:  Routine general medical examination at a health care  facility - Plan: CBC with Differential, Comprehensive metabolic panel, TSH, Lipid panel, Hemoglobin A1c, Vitamin B12, Vitamin D, 25-hydroxy, POCT urinalysis dipstick  Anemia, unspecified - Plan: CBC with Differential, Comprehensive metabolic panel, Iron, Vitamin D, 25-hydroxy, IBC Panel, IFOBT POC (occult bld, rslt in office)  Breast cancer screening - Plan: MM Digital Screening  Dizziness  Abdominal pain, epigastric  1. Complete physical exam: anticipatory guidance provided --- exercise, weight maintenance. Pap smear UTD per gynecology; refer for mammogram.  Immunizations UTD.  Obtain labs. 2.  Anemia:  New to this provider; obtain hemosure due to age.  3.  Breast cancer screening: refer for mammogram. 4.  Dizziness:  New. Obtain labs.  Normal neurological exam in office. 5.  Abdominal pain epigastric:  New.  Rx for Omeprazole provided.   Meds ordered this encounter  Medications  . omeprazole (PRILOSEC) 20 MG capsule    Sig: Take 1 capsule (20 mg total) by mouth daily.    Dispense:  30 capsule    Refill:  11   Nilda Simmer, M.D.  [facility] [address] [place], [place]  [address] [phone] phone [phone] fax

## 2013-07-07 ENCOUNTER — Telehealth: Payer: Self-pay

## 2013-07-07 LAB — VITAMIN D 25 HYDROXY (VIT D DEFICIENCY, FRACTURES): Vit D, 25-Hydroxy: 28 ng/mL — ABNORMAL LOW (ref 30–89)

## 2013-07-07 LAB — TSH: TSH: 1.038 u[IU]/mL (ref 0.350–4.500)

## 2013-07-07 NOTE — Telephone Encounter (Signed)
Patient called no answer.Left message on personal voice mail received message from Tiffany in Coumadin clinic you need letter saying ok to have nasal implant surgery.Dr.Jordan out of office this week will check with him 07/13/13 and call you back.

## 2013-07-13 ENCOUNTER — Encounter: Payer: Self-pay | Admitting: *Deleted

## 2013-07-25 ENCOUNTER — Telehealth: Payer: Self-pay

## 2013-07-25 NOTE — Telephone Encounter (Signed)
Tried to call patient no answer.Left message on personal voice mail need to return call 07/26/13, Dr.Jordan advised will need to hold Coumadin 5 days day prior to nasal implant surgery.

## 2013-07-26 ENCOUNTER — Telehealth: Payer: Self-pay | Admitting: Cardiology

## 2013-07-26 NOTE — Telephone Encounter (Signed)
New message     Returned nurses call 

## 2013-07-26 NOTE — Telephone Encounter (Signed)
Spoke to patient Dr.Jordan cleared patient for upcoming nasal implant surgery.Advised to hold coumadin 5 days prior to surgery.Letter signed by Dr.Jordan and mailed to patient's home.

## 2013-08-03 ENCOUNTER — Ambulatory Visit (INDEPENDENT_AMBULATORY_CARE_PROVIDER_SITE_OTHER): Payer: BC Managed Care – PPO | Admitting: Pharmacist

## 2013-08-03 DIAGNOSIS — Z952 Presence of prosthetic heart valve: Secondary | ICD-10-CM

## 2013-08-03 DIAGNOSIS — Z7901 Long term (current) use of anticoagulants: Secondary | ICD-10-CM

## 2013-08-03 DIAGNOSIS — Z954 Presence of other heart-valve replacement: Secondary | ICD-10-CM

## 2013-08-03 DIAGNOSIS — I359 Nonrheumatic aortic valve disorder, unspecified: Secondary | ICD-10-CM

## 2013-08-03 DIAGNOSIS — I4891 Unspecified atrial fibrillation: Secondary | ICD-10-CM

## 2013-08-03 LAB — POCT INR: INR: 2.9

## 2013-08-10 ENCOUNTER — Ambulatory Visit
Admission: RE | Admit: 2013-08-10 | Discharge: 2013-08-10 | Disposition: A | Discharge status: Home / Self Care | Payer: BC Managed Care – PPO | Source: Ambulatory Visit | Attending: Internal Medicine | Admitting: Internal Medicine

## 2013-08-10 DIAGNOSIS — C220 Liver cell carcinoma: Secondary | ICD-10-CM

## 2013-09-07 ENCOUNTER — Ambulatory Visit
Admission: RE | Admit: 2013-09-07 | Discharge: 2013-09-07 | Disposition: A | Discharge status: Home / Self Care | Payer: BC Managed Care – PPO | Source: Ambulatory Visit | Attending: Family Medicine | Admitting: Family Medicine

## 2013-09-07 DIAGNOSIS — Z1239 Encounter for other screening for malignant neoplasm of breast: Secondary | ICD-10-CM

## 2013-09-14 ENCOUNTER — Ambulatory Visit (INDEPENDENT_AMBULATORY_CARE_PROVIDER_SITE_OTHER): Payer: BC Managed Care – PPO | Admitting: Pharmacist

## 2013-09-14 DIAGNOSIS — I359 Nonrheumatic aortic valve disorder, unspecified: Secondary | ICD-10-CM

## 2013-09-14 DIAGNOSIS — Z954 Presence of other heart-valve replacement: Secondary | ICD-10-CM

## 2013-09-14 DIAGNOSIS — Z7901 Long term (current) use of anticoagulants: Secondary | ICD-10-CM

## 2013-09-14 DIAGNOSIS — I4891 Unspecified atrial fibrillation: Secondary | ICD-10-CM

## 2013-09-14 DIAGNOSIS — Z952 Presence of prosthetic heart valve: Secondary | ICD-10-CM

## 2013-09-14 LAB — POCT INR: INR: 3.2

## 2013-10-09 ENCOUNTER — Other Ambulatory Visit: Payer: Self-pay | Admitting: Cardiology

## 2013-10-26 ENCOUNTER — Ambulatory Visit (INDEPENDENT_AMBULATORY_CARE_PROVIDER_SITE_OTHER): Payer: BC Managed Care – PPO | Admitting: Pharmacist

## 2013-10-26 ENCOUNTER — Telehealth: Payer: Self-pay | Admitting: Pharmacist

## 2013-10-26 DIAGNOSIS — I359 Nonrheumatic aortic valve disorder, unspecified: Secondary | ICD-10-CM

## 2013-10-26 DIAGNOSIS — Z952 Presence of prosthetic heart valve: Secondary | ICD-10-CM

## 2013-10-26 DIAGNOSIS — Z7901 Long term (current) use of anticoagulants: Secondary | ICD-10-CM

## 2013-10-26 DIAGNOSIS — I4891 Unspecified atrial fibrillation: Secondary | ICD-10-CM

## 2013-10-26 DIAGNOSIS — Z954 Presence of other heart-valve replacement: Secondary | ICD-10-CM

## 2013-10-26 LAB — POCT INR: INR: 2.7

## 2013-10-26 NOTE — Telephone Encounter (Signed)
Message copied by Bishop Limbo on Wed Oct 26, 2013  4:44 PM ------      Message from: [place], PETER M      Created: Wed Oct 26, 2013  1:13 PM      Regarding: RE: surgery / coumadin       Yes I don't think she needs bridging.            Peter      ----- Message -----         From: Maralyn Sago Olivia Royse, [place]         Sent: 10/26/2013  12:53 PM           To: Peter M [place], MD      Subject: surgery / coumadin                                       Patient is planning on having a nose implant surgery sometime in next few months.  You have already cleared her for surgery and she understands to hold warfarin 5 days prior.  She has a h/o mechanical AVR but no other risk factors for stroke.  We typically would not bridge a patient under these circumstances given aortic valve and no other RF.  Are you okay with not bridging her since she will only be off for 5 days then going back on warfarin again the night of the surgery?            Thanks,      Ysidro Evert       ------

## 2013-10-26 NOTE — Telephone Encounter (Signed)
Patient aware bridging won't be necessary for her nose surgery.  She will restart warfarin immediately after surgery.

## 2013-11-25 ENCOUNTER — Other Ambulatory Visit: Payer: Self-pay | Admitting: Cardiology

## 2013-11-30 ENCOUNTER — Ambulatory Visit (INDEPENDENT_AMBULATORY_CARE_PROVIDER_SITE_OTHER): Payer: BC Managed Care – PPO | Admitting: Pharmacist

## 2013-11-30 DIAGNOSIS — Z7901 Long term (current) use of anticoagulants: Secondary | ICD-10-CM

## 2013-11-30 DIAGNOSIS — I4891 Unspecified atrial fibrillation: Secondary | ICD-10-CM

## 2013-11-30 DIAGNOSIS — Z954 Presence of other heart-valve replacement: Secondary | ICD-10-CM

## 2013-11-30 DIAGNOSIS — I359 Nonrheumatic aortic valve disorder, unspecified: Secondary | ICD-10-CM

## 2013-11-30 DIAGNOSIS — Z952 Presence of prosthetic heart valve: Secondary | ICD-10-CM

## 2013-11-30 LAB — POCT INR: INR: 2.3

## 2013-12-09 ENCOUNTER — Encounter: Payer: Self-pay | Admitting: Family Medicine

## 2014-01-04 ENCOUNTER — Ambulatory Visit (INDEPENDENT_AMBULATORY_CARE_PROVIDER_SITE_OTHER): Payer: BC Managed Care – PPO | Admitting: *Deleted

## 2014-01-04 DIAGNOSIS — I359 Nonrheumatic aortic valve disorder, unspecified: Secondary | ICD-10-CM

## 2014-01-04 DIAGNOSIS — Z952 Presence of prosthetic heart valve: Secondary | ICD-10-CM

## 2014-01-04 DIAGNOSIS — Z954 Presence of other heart-valve replacement: Secondary | ICD-10-CM

## 2014-01-04 DIAGNOSIS — Z7901 Long term (current) use of anticoagulants: Secondary | ICD-10-CM

## 2014-01-04 LAB — POCT INR: INR: 2.7

## 2014-02-08 ENCOUNTER — Ambulatory Visit (INDEPENDENT_AMBULATORY_CARE_PROVIDER_SITE_OTHER): Payer: BC Managed Care – PPO | Admitting: *Deleted

## 2014-02-08 DIAGNOSIS — I359 Nonrheumatic aortic valve disorder, unspecified: Secondary | ICD-10-CM

## 2014-02-08 DIAGNOSIS — Z7901 Long term (current) use of anticoagulants: Secondary | ICD-10-CM

## 2014-02-08 DIAGNOSIS — Z954 Presence of other heart-valve replacement: Secondary | ICD-10-CM

## 2014-02-08 DIAGNOSIS — Z952 Presence of prosthetic heart valve: Secondary | ICD-10-CM

## 2014-02-08 LAB — POCT INR: INR: 3

## 2014-02-15 ENCOUNTER — Other Ambulatory Visit: Payer: Self-pay | Admitting: Nurse Practitioner

## 2014-02-15 DIAGNOSIS — C220 Liver cell carcinoma: Secondary | ICD-10-CM

## 2014-03-02 ENCOUNTER — Ambulatory Visit
Admission: RE | Admit: 2014-03-02 | Discharge: 2014-03-02 | Disposition: A | Discharge status: Home / Self Care | Payer: BC Managed Care – PPO | Source: Ambulatory Visit | Attending: Nurse Practitioner | Admitting: Nurse Practitioner

## 2014-03-02 DIAGNOSIS — C220 Liver cell carcinoma: Secondary | ICD-10-CM

## 2014-03-06 ENCOUNTER — Ambulatory Visit (INDEPENDENT_AMBULATORY_CARE_PROVIDER_SITE_OTHER): Payer: BC Managed Care – PPO | Admitting: Pharmacist

## 2014-03-06 ENCOUNTER — Encounter: Payer: Self-pay | Admitting: Cardiology

## 2014-03-06 ENCOUNTER — Ambulatory Visit (INDEPENDENT_AMBULATORY_CARE_PROVIDER_SITE_OTHER): Payer: BC Managed Care – PPO | Admitting: Cardiology

## 2014-03-06 VITALS — BP 107/69 | HR 67 | Ht 64.0 in | Wt 129.2 lb

## 2014-03-06 DIAGNOSIS — Z7901 Long term (current) use of anticoagulants: Secondary | ICD-10-CM

## 2014-03-06 DIAGNOSIS — Z954 Presence of other heart-valve replacement: Secondary | ICD-10-CM

## 2014-03-06 DIAGNOSIS — Z952 Presence of prosthetic heart valve: Secondary | ICD-10-CM

## 2014-03-06 DIAGNOSIS — I359 Nonrheumatic aortic valve disorder, unspecified: Secondary | ICD-10-CM

## 2014-03-06 DIAGNOSIS — R0602 Shortness of breath: Secondary | ICD-10-CM

## 2014-03-06 LAB — POCT INR: INR: 2.7

## 2014-03-06 NOTE — Patient Instructions (Signed)
Continue your current therapy  I will see you in one year   

## 2014-03-07 NOTE — Progress Notes (Signed)
   [name] Date of Birth: 01-03-1971 Medical Record #[id number]  History of Present Illness: [name] is seen today for followup. She has a history of severe aortic insufficiency and underwent aortic valve replacement with a mechanical prosthesis in April of 2006. This was a #21 mm On-X valve. She does have some chronic mild dyspnea. She's had extensive pulmonary and cardiac evaluation for this which has been unremarkable. She is on chronic anticoagulation. She does have a history of hepatitis B and is being treated by Dr. Patsy [place]. On follow up today she is without complaints. No chest pain, dyspnea, or palpitations.  Current Outpatient Prescriptions on File Prior to Visit  Medication Sig Dispense Refill  . entecavir (BARACLUDE) 1 MG tablet Take 1 mg by mouth daily.      Marland Kitchen omeprazole (PRILOSEC) 20 MG capsule Take 1 capsule (20 mg total) by mouth daily.  30 capsule  11  . warfarin (COUMADIN) 4 MG tablet TAKE AS DIRECTED BY ANTICOAGULATION CLINIC  45 tablet  3   No current facility-administered medications on file prior to visit.    No Known Allergies  Past Medical History  Diagnosis Date  . Severe aortic insufficiency 09/01/2004    s/p aortic valve replacement.    . Femur fracture, left 1990    with open reduction and internal fixation   . H/O: rheumatic fever   . History of allergic rhinitis   . Hepatitis B carrier     positive hepatitis B carrier; maintained on Baraclude; followed by Hepatitis clinic.    Past Surgical History  Procedure Laterality Date  . Aortic valve replacement  4/06    20mm On-X mechanical valve  . Hemorrhoid surgery    . Cardiac catheterization  11/15/2004    Ejection fraction is estimated 55%    History  Smoking status  . Never Smoker   Smokeless tobacco  . Not on file    History  Alcohol Use No    Family History  Problem Relation Age of Onset  . Heart disease Brother     valvular dysfunction; s/p replacement  . Hypertension Mother   .  Hyperlipidemia Mother   . Hypothyroidism Mother   . Heart disease Father   . Hypertension Father   . Hyperlipidemia Father     Review of Systems: As noted in history of present illness.  All other systems were reviewed and are negative.  Physical Exam: BP 107/69  Pulse 67  Ht 5\' 4"  (1.626 m)  Wt 129 lb 3.2 oz (58.605 kg)  BMI 22.17 kg/m2 She is a pleasant, Asian female in no acute distress. HEENT exam is unremarkable. She is no JVD or bruits. Lungs are clear. Cardiac exam reveals a regular rate and rhythm without gallop. She has a good mechanical aortic valve click. Abdomen is soft and nontender. She has no significant edema. Skin is warm and dry. She is alert and oriented x3. Cranial nerves II through XII are intact. LABORATORY DATA: ECG today- NSR, normal.  INR today is 2.7  Assessment / Plan: 1. Status post mechanical aortic valve replacement for severe aortic insufficiency. Exam is stable. Continue anticoagulation and routine SBE prophylaxis. Last Echo in Feb. 2011. Follow up in one year. 2.  Hepatitis B.

## 2014-04-17 ENCOUNTER — Ambulatory Visit: Payer: BC Managed Care – PPO | Admitting: Pharmacist Clinician (PhC)/ Clinical Pharmacy Specialist

## 2014-04-21 ENCOUNTER — Other Ambulatory Visit: Payer: Self-pay | Admitting: Cardiology

## 2014-04-26 ENCOUNTER — Ambulatory Visit (INDEPENDENT_AMBULATORY_CARE_PROVIDER_SITE_OTHER): Payer: BC Managed Care – PPO | Admitting: Pharmacist Clinician (PhC)/ Clinical Pharmacy Specialist

## 2014-04-26 DIAGNOSIS — Z952 Presence of prosthetic heart valve: Secondary | ICD-10-CM

## 2014-04-26 DIAGNOSIS — Z954 Presence of other heart-valve replacement: Secondary | ICD-10-CM

## 2014-04-26 DIAGNOSIS — I359 Nonrheumatic aortic valve disorder, unspecified: Secondary | ICD-10-CM

## 2014-04-26 DIAGNOSIS — Z7901 Long term (current) use of anticoagulants: Secondary | ICD-10-CM

## 2014-04-26 LAB — POCT INR: INR: 3.5

## 2014-05-22 ENCOUNTER — Ambulatory Visit (INDEPENDENT_AMBULATORY_CARE_PROVIDER_SITE_OTHER): Payer: BC Managed Care – PPO

## 2014-05-22 DIAGNOSIS — Z23 Encounter for immunization: Secondary | ICD-10-CM

## 2014-06-01 ENCOUNTER — Other Ambulatory Visit: Payer: Self-pay | Admitting: Cardiology

## 2014-06-07 ENCOUNTER — Ambulatory Visit (INDEPENDENT_AMBULATORY_CARE_PROVIDER_SITE_OTHER): Payer: BC Managed Care – PPO | Admitting: Pharmacist Clinician (PhC)/ Clinical Pharmacy Specialist

## 2014-06-07 DIAGNOSIS — I359 Nonrheumatic aortic valve disorder, unspecified: Secondary | ICD-10-CM

## 2014-06-07 DIAGNOSIS — Z7901 Long term (current) use of anticoagulants: Secondary | ICD-10-CM

## 2014-06-07 DIAGNOSIS — Z952 Presence of prosthetic heart valve: Secondary | ICD-10-CM

## 2014-06-07 DIAGNOSIS — Z954 Presence of other heart-valve replacement: Secondary | ICD-10-CM

## 2014-06-07 LAB — POCT INR: INR: 3.2

## 2014-07-17 ENCOUNTER — Ambulatory Visit: Payer: BC Managed Care – PPO | Admitting: Pharmacist Clinician (PhC)/ Clinical Pharmacy Specialist

## 2014-07-19 ENCOUNTER — Ambulatory Visit (INDEPENDENT_AMBULATORY_CARE_PROVIDER_SITE_OTHER): Payer: BC Managed Care – PPO | Admitting: Pharmacist Clinician (PhC)/ Clinical Pharmacy Specialist

## 2014-07-19 DIAGNOSIS — Z7901 Long term (current) use of anticoagulants: Secondary | ICD-10-CM

## 2014-07-19 DIAGNOSIS — Z952 Presence of prosthetic heart valve: Secondary | ICD-10-CM

## 2014-07-19 DIAGNOSIS — Z954 Presence of other heart-valve replacement: Secondary | ICD-10-CM

## 2014-07-19 DIAGNOSIS — I359 Nonrheumatic aortic valve disorder, unspecified: Secondary | ICD-10-CM

## 2014-07-19 LAB — POCT INR: INR: 3.8

## 2014-08-29 ENCOUNTER — Ambulatory Visit (INDEPENDENT_AMBULATORY_CARE_PROVIDER_SITE_OTHER): Payer: BC Managed Care – PPO | Admitting: Physician Assistant

## 2014-08-29 VITALS — BP 118/84 | HR 80 | Temp 98.1°F | Resp 18 | Ht 64.0 in | Wt 131.8 lb

## 2014-08-29 DIAGNOSIS — Z1329 Encounter for screening for other suspected endocrine disorder: Secondary | ICD-10-CM

## 2014-08-29 DIAGNOSIS — Z13228 Encounter for screening for other metabolic disorders: Secondary | ICD-10-CM

## 2014-08-29 DIAGNOSIS — Z130 Encounter for screening for diseases of the blood and blood-forming organs and certain disorders involving the immune mechanism: Secondary | ICD-10-CM

## 2014-08-29 DIAGNOSIS — Z113 Encounter for screening for infections with a predominantly sexual mode of transmission: Secondary | ICD-10-CM

## 2014-08-29 DIAGNOSIS — B1910 Unspecified viral hepatitis B without hepatic coma: Secondary | ICD-10-CM

## 2014-08-29 DIAGNOSIS — Z114 Encounter for screening for human immunodeficiency virus [HIV]: Secondary | ICD-10-CM

## 2014-08-29 DIAGNOSIS — Z1159 Encounter for screening for other viral diseases: Secondary | ICD-10-CM

## 2014-08-29 DIAGNOSIS — Z13220 Encounter for screening for lipoid disorders: Secondary | ICD-10-CM

## 2014-08-29 DIAGNOSIS — B169 Acute hepatitis B without delta-agent and without hepatic coma: Secondary | ICD-10-CM

## 2014-08-29 DIAGNOSIS — Z0000 Encounter for general adult medical examination without abnormal findings: Secondary | ICD-10-CM

## 2014-08-29 LAB — LIPID PANEL
Cholesterol: 161 mg/dL (ref 0–200)
HDL: 46 mg/dL (ref >39)
LDL Cholesterol: 98 mg/dL (ref 0–99)
Total CHOL/HDL Ratio: 3.5 Ratio
Triglycerides: 87 mg/dL (ref <150)
VLDL: 17 mg/dL (ref 0–40)

## 2014-08-29 LAB — COMPLETE METABOLIC PANEL WITH GFR
ALK PHOS: 37 U/L — AB (ref 39–117)
ALT: 11 U/L (ref 0–35)
AST: 16 U/L (ref 0–37)
Albumin: 3.9 g/dL (ref 3.5–5.2)
BILIRUBIN TOTAL: 0.4 mg/dL (ref 0.2–1.2)
BUN: 12 mg/dL (ref 6–23)
CO2: 27 mEq/L (ref 19–32)
Calcium: 8.8 mg/dL (ref 8.4–10.5)
Chloride: 107 mEq/L (ref 96–112)
Creat: 0.49 mg/dL — ABNORMAL LOW (ref 0.50–1.10)
GFR, Est African American: >89 mL/min
GFR, Est Non African American: >89 mL/min
Glucose, Bld: 83 mg/dL (ref 70–99)
POTASSIUM: 4.3 meq/L (ref 3.5–5.3)
Sodium: 138 mEq/L (ref 135–145)
TOTAL PROTEIN: 7 g/dL (ref 6.0–8.3)

## 2014-08-29 LAB — RPR TITER

## 2014-08-29 LAB — RPR: RPR Ser Ql: REACTIVE — AB

## 2014-08-29 LAB — TSH: TSH: 1.195 u[IU]/mL (ref 0.350–4.500)

## 2014-08-29 LAB — HEPATITIS C ANTIBODY: HCV Ab: NEGATIVE

## 2014-08-29 LAB — HIV ANTIBODY (ROUTINE TESTING W REFLEX): HIV 1&2 Ab, 4th Generation: NONREACTIVE

## 2014-08-29 NOTE — Progress Notes (Signed)
Subjective:    Patient ID: [name], female    DOB: 08-13-1971, 43 y.o.   MRN: [id number]  HPI  Pt presents to clinic for her CPE.  She is only having problems with initiation of sleep.  It has been going on a while.  She uses sleepy tea and melatonin and that helps most nights of the week the other 2-3 times a week when it does not help she uses tylenol pm and that helps every time.  She does not want to rely on medication to help her sleep.  She has what she considers normal stress with 3 kids and a husband and works full time.  She says sometimes she has trouble sleeping because her mind will not turn off and mostly she starts to get worried about not getting to sleep.  She will sometimes wake up in the middle of the night and it will take 30-15mins to get back to sleep.    Last Pap smear - 2014 - normal - has GYN appt next week Last mammogram - 2014 - normal Last Vision exam - June 2015 - wear reading glasses Last Dental exam - 2014   Patient Active Problem List   Diagnosis Date Noted  . Hepatitis B 04/28/2012  . Aortic valve disorders 02/11/2012  . Encounter for long-term (current) use of anticoagulants 02/11/2012  . S/P AVR 12/16/2011  . Chronic anticoagulation 12/16/2011  . PULMONARY NODULE 01/31/2010  . DYSPNEA 01/31/2010   Prior to Admission medications   Medication Sig Start Date End Date Taking? Authorizing Provider  entecavir (BARACLUDE) 1 MG tablet Take 1 mg by mouth daily.   Yes Historical Provider, MD  warfarin (COUMADIN) 4 MG tablet TAKE AS DIRECTED BY ANTICOAGULATION CLINIC 06/01/14  Yes Peter M Martinique, MD  omeprazole (PRILOSEC) 20 MG capsule Take 1 capsule (20 mg total) by mouth daily. Patient not taking: Reported on 08/29/2014 07/06/13   Wardell Honour, MD   No Known Allergies  Medications, allergies, past medical history, surgical history, family history, social history and problem list reviewed and updated.    Review of Systems  Constitutional: Negative.     HENT: Negative.   Eyes: Negative.   Respiratory: Negative.   Cardiovascular: Negative.   Gastrointestinal: Negative.   Genitourinary: Negative.   Musculoskeletal: Negative.   Skin: Negative.   Allergic/Immunologic: Negative.   Neurological: Positive for dizziness (only when standing up quickly sometimes).  Psychiatric/Behavioral: Positive for sleep disturbance. Negative for decreased concentration. The patient is not nervous/anxious.        Objective:   Physical Exam  Constitutional: She is oriented to person, place, and time. She appears well-developed and well-nourished.  BP 118/84 mmHg  Pulse 80  Temp(Src) 98.1 F (36.7 C) (Oral)  Resp 18  Ht 5\' 4"  (1.626 m)  Wt 131 lb 12.8 oz (59.784 kg)  BMI 22.61 kg/m2  SpO2 98%  LMP 08/15/2014   HENT:  Head: Normocephalic and atraumatic.  Right Ear: Hearing, tympanic membrane, external ear and ear canal normal.  Left Ear: Hearing, tympanic membrane, external ear and ear canal normal.  Nose: Nose normal.  Mouth/Throat: Uvula is midline, oropharynx is clear and moist and mucous membranes are normal.  Eyes: Conjunctivae and EOM are normal. Pupils are equal, round, and reactive to light.  Left medial pterygium just encroaching on the iris but not near the pupil.  Neck: Normal range of motion. Neck supple. No thyromegaly present.  Cardiovascular: Normal rate and regular rhythm.  Mechanical valve click  Pulmonary/Chest: Effort normal and breath sounds normal. She has no wheezes.  Abdominal: Soft. Bowel sounds are normal. There is no tenderness.  Musculoskeletal: Normal range of motion.  Lymphadenopathy:    She has no cervical adenopathy.  Neurological: She is alert and oriented to person, place, and time.  Skin: Skin is warm and dry.  Psychiatric: She has a normal mood and affect. Her behavior is normal. Judgment and thought content normal.      Assessment & Plan:  Annual physical exam  Hepatitis B virus infection, unspecified  chronicity - Plan: COMPLETE METABOLIC PANEL WITH GFR  Screening for metabolic disorder  Screening, anemia, deficiency, iron - Plan: CBC with Differential  Screening for HIV (human immunodeficiency virus) - Plan: HIV antibody  Screening for STD (sexually transmitted disease) - Plan: RPR  Screening for thyroid disorder - Plan: TSH  Screening cholesterol level - Plan: Lipid panel  Need for hepatitis C screening test - Plan: Hepatitis C Ab Reflex HCV RNA, QUANT   Insomnia - we discussed healthy bedtime habits - patient is not interested in medications - she will continue her sleeping tea and melatonin (correct doses were given to her).  She will try good sleep routine and then if no help we will try CBT to help with her symptoms.  Check labs.  Windell Hummingbird PA-C  [facility] 08/29/2014 4:47 PM

## 2014-08-29 NOTE — Patient Instructions (Addendum)
Sleep hygiene: Basic rules for a good night's sleep  Sleep only as much as you need to feel rested and then get out of bed  Keep a regular sleep schedule  Avoid forcing sleep  Exercise regularly for at least 20 minutes, preferably 4 to 5 hours before bedtime  Avoid caffeinated beverages after lunch  Avoid alcohol near bedtime: no "night cap"  Avoid smoking, especially in the evening  Do not go to bed hungry  Adjust bedroom environment  Avoid prolonged use of light-emitting screens before bedtime   Deal with your worries before bedtime   Recommend Melatonin 0.1-0.5mg  nightly - you will have to check your pill dose to see what you have purchased.  If this still does not work - it might be helpful to see a therapist to help with changing your thought process prior to bed.  I will contact you with your lab results as soon as they are available.   If you have not heard from me in 2 weeks, please contact me.  The fastest way to get your results is to register for My Chart (see the instructions on the last page of this printout).

## 2014-08-30 ENCOUNTER — Other Ambulatory Visit: Payer: Self-pay | Admitting: Pharmacist Clinician (PhC)/ Clinical Pharmacy Specialist

## 2014-08-30 ENCOUNTER — Telehealth: Payer: Self-pay | Admitting: *Deleted

## 2014-08-30 ENCOUNTER — Ambulatory Visit (INDEPENDENT_AMBULATORY_CARE_PROVIDER_SITE_OTHER): Payer: BC Managed Care – PPO | Admitting: Pharmacist Clinician (PhC)/ Clinical Pharmacy Specialist

## 2014-08-30 DIAGNOSIS — Z954 Presence of other heart-valve replacement: Secondary | ICD-10-CM

## 2014-08-30 DIAGNOSIS — Z952 Presence of prosthetic heart valve: Secondary | ICD-10-CM

## 2014-08-30 DIAGNOSIS — Z7901 Long term (current) use of anticoagulants: Secondary | ICD-10-CM

## 2014-08-30 DIAGNOSIS — I359 Nonrheumatic aortic valve disorder, unspecified: Secondary | ICD-10-CM

## 2014-08-30 DIAGNOSIS — Z130 Encounter for screening for diseases of the blood and blood-forming organs and certain disorders involving the immune mechanism: Secondary | ICD-10-CM

## 2014-08-30 LAB — CBC WITH DIFFERENTIAL/PLATELET

## 2014-08-30 LAB — FLUORESCENT TREPONEMAL AB(FTA)-IGG-BLD: FLUORESCENT TREPONEMAL ABS: REACTIVE — AB

## 2014-08-30 LAB — POCT INR: INR: 3.8

## 2014-08-30 MED ORDER — WARFARIN SODIUM 4 MG PO TABS: ORAL_TABLET | ORAL | Status: DC

## 2014-08-30 NOTE — Telephone Encounter (Signed)
[facility] did not receive the lavender tube. Please advise if you want pt to rtc for a blood draw to perform CBC

## 2014-08-31 NOTE — Telephone Encounter (Signed)
Yes I would like to have a CBC -

## 2014-09-04 NOTE — Telephone Encounter (Signed)
Pt has been notified- lab order has been placed.

## 2014-09-05 ENCOUNTER — Telehealth: Payer: Self-pay | Admitting: Physician Assistant

## 2014-09-05 ENCOUNTER — Other Ambulatory Visit (INDEPENDENT_AMBULATORY_CARE_PROVIDER_SITE_OTHER): Payer: 59 | Admitting: *Deleted

## 2014-09-05 DIAGNOSIS — Z130 Encounter for screening for diseases of the blood and blood-forming organs and certain disorders involving the immune mechanism: Secondary | ICD-10-CM

## 2014-09-05 LAB — POCT CBC
GRANULOCYTE PERCENT: 51.6 % (ref 37–80)
HEMATOCRIT: 33.1 % — AB (ref 37.7–47.9)
HEMOGLOBIN: 10.4 g/dL — AB (ref 12.2–16.2)
LYMPH, POC: 1.8 (ref 0.6–3.4)
MCH, POC: 24.4 pg — AB (ref 27–31.2)
MCHC: 31.4 g/dL — AB (ref 31.8–35.4)
MCV: 77.6 fL — AB (ref 80–97)
MID (cbc): 0.4 (ref 0–0.9)
MPV: 6.7 fL (ref 0–99.8)
POC Granulocyte: 2.4 (ref 2–6.9)
POC LYMPH %: 39.5 % (ref 10–50)
POC MID %: 8.89 % (ref 0–12)
Platelet Count, POC: 286 10*3/uL (ref 142–424)
RBC: 4.26 M/uL (ref 4.04–5.48)
RDW, POC: 19 %
WBC: 4.6 10*3/uL (ref 4.6–10.2)

## 2014-09-05 NOTE — Telephone Encounter (Signed)
Called and spoke with Dr Megan Salon regarding her lab results.  She has latent syphilis and has most likely had for many years due to her low titer.  He recommended treatment with Bicillin 2.4 weekly for 3 weeks.  I called and spoke with the patient and she understands.  She is not sure where she would have been exposed - she did have surgery in [place] 22 years ago but does not think she needed blood products.  She has only been sexually active with her husband for the last 44 years.  She plans to bring him into the office so he can be tested and treated if indicated.  She plans to RTC on 1/7 for her 1st injection.

## 2014-09-05 NOTE — Telephone Encounter (Signed)
Please send a health department card for patients latent syphilis - she plans to start treatment on 1/7.

## 2014-09-06 NOTE — Telephone Encounter (Signed)
Done

## 2014-09-07 ENCOUNTER — Ambulatory Visit (INDEPENDENT_AMBULATORY_CARE_PROVIDER_SITE_OTHER): Payer: 59 | Admitting: Physician Assistant

## 2014-09-07 VITALS — HR 80 | Temp 98.2°F | Resp 18

## 2014-09-07 DIAGNOSIS — A530 Latent syphilis, unspecified as early or late: Secondary | ICD-10-CM

## 2014-09-07 MED ORDER — PENICILLIN G BENZATHINE 1200000 UNIT/2ML IM SUSP
2.4000 10*6.[IU] | Freq: Once | INTRAMUSCULAR | Status: AC
Start: 2014-09-21 — End: 2014-09-21
  Administered 2014-09-21: 2.4 10*6.[IU] via INTRAMUSCULAR

## 2014-09-07 MED ORDER — PENICILLIN G BENZATHINE 1200000 UNIT/2ML IM SUSP
2.4000 10*6.[IU] | Freq: Once | INTRAMUSCULAR | Status: AC
  Administered 2014-09-14: 2.4 10*6.[IU] via INTRAMUSCULAR

## 2014-09-07 MED ORDER — PENICILLIN G BENZATHINE 1200000 UNIT/2ML IM SUSP
2.4000 10*6.[IU] | Freq: Once | INTRAMUSCULAR | Status: AC
  Administered 2014-09-07: 2.4 10*6.[IU] via INTRAMUSCULAR

## 2014-09-07 NOTE — Progress Notes (Signed)
Pt presents to clinic for her 1st of 3 weekly Bicillin 2.4U for latent syphilis treatment.  She remembers when she came to the [place] that she got a shot but she does not remember what it was for.

## 2014-09-07 NOTE — Telephone Encounter (Signed)
[facility] card faxed.

## 2014-09-11 ENCOUNTER — Telehealth: Payer: Self-pay

## 2014-09-11 NOTE — Telephone Encounter (Signed)
[name] from the state health dept called to get more information on this pt for her pos RPR. ?'s answered. FYI

## 2014-09-14 ENCOUNTER — Ambulatory Visit (INDEPENDENT_AMBULATORY_CARE_PROVIDER_SITE_OTHER): Payer: 59 | Admitting: *Deleted

## 2014-09-14 DIAGNOSIS — A530 Latent syphilis, unspecified as early or late: Secondary | ICD-10-CM

## 2014-09-14 NOTE — Patient Instructions (Signed)
Syphilis Syphilis is an infectious disease. It can cause serious complications if left untreated.  CAUSES  Syphilis is caused by a type of bacteria called Treponema pallidum. It is most commonly spread through sexual contact. Syphilis may also spread to a fetus through the blood of the mother.  SIGNS AND SYMPTOMS Symptoms vary depending on the stage of the disease. Some symptoms may disappear without treatment. However, this does not mean that the infection is gone. One form of syphilis (called latent syphilis) has no symptoms.  Primary Syphilis  Painless sores (chancres) in and around the genital organs and mouth.  Swollen lymph nodes near the sores. Secondary Syphilis  A rash or sores over any portion of the body, including the palms of the hands and soles of the feet.  Fever.  Headache.  Sore throat.  Swollen lymph nodes.  New sores in the mouth or on the genitals.  Feeling generally ill.  Having pain in the joints. Tertiary Syphilis The third stage of syphilis involves severe damage to different organs in the body, such as the brain, spinal cord, and heart. Signs and symptoms may include:   Dementia.  Personality and mood changes.  Difficulty walking.  Heart failure.  Fainting.  Enlargement (aneurysm) of the aorta.  Tumors of the skin, bones, or liver.  Muscle weakness.  Sudden "lightning" pains, numbness, or tingling.  Problems with coordination.  Vision changes. DIAGNOSIS   A physical exam will be done.  Blood tests will be done to confirm the diagnosis.  If the disease is in the first or second stages, a fluid (drainage) sample from a sore or rash may be examined under a microscope to detect the disease-causing bacteria.  Fluid around the spine may need to be examined to detect brain damage or inflammation of the brain lining (meningitis).  If the disease is in the third stage, X-rays, CT scans, MRIs, echocardiograms, ultrasounds, or cardiac  catheterization may also be done to detect disease of the heart, aorta, or brain. TREATMENT  Syphilis can be cured with antibiotic medicine if a diagnosis is made early. During the first day of treatment, you may experience fever, chills, headache, nausea, or aching all over your body. This is a normal reaction to the antibiotics.  HOME CARE INSTRUCTIONS   Take your antibiotic medicine as directed by your health care provider. Finish the antibiotic even if you start to feel better. Incomplete treatment will put you at risk for continued infection and could be life threatening.  Take medicines only as directed by your health care provider.  Do not have sexual intercourse until your treatment is completed or as directed by your health care provider.  Inform your recent sexual partners that you were diagnosed with syphilis. They need to seek care and treatment, even if they have no symptoms. It is necessary that all your sexual partners be tested for infection and treated if they have the disease.  Keep all follow-up visits as directed by your health care provider. It is important to keep all your appointments.  If your test results are not ready during your visit, make an appointment with your health care provider to find out the results. Do not assume everything is normal if you have not heard from your health care provider or the medical facility. It is your responsibility to get your test results. SEEK MEDICAL CARE IF:  You continue to have any of the following 24 hours after beginning treatment:  Fever.  Chills.  Headache.  Nausea.  Aching all over your body.  You have symptoms of an allergic reaction to medicine, such as:  Chills.  A headache.  Light-headedness.  A new rash (especially hives).  Difficulty breathing. MAKE SURE YOU:   Understand these instructions.  Will watch your condition.  Will get help right away if you are not doing well or get worse. Document  Released: 06/08/2013 Document Revised: 01/02/2014 Document Reviewed: 06/08/2013 [facility] Patient Information 2015 [place], [place]. This information is not intended to replace advice given to you by your health care provider. Make sure you discuss any questions you have with your health care provider.

## 2014-09-20 ENCOUNTER — Ambulatory Visit: Payer: BC Managed Care – PPO | Admitting: Pharmacist Clinician (PhC)/ Clinical Pharmacy Specialist

## 2014-09-21 ENCOUNTER — Ambulatory Visit (INDEPENDENT_AMBULATORY_CARE_PROVIDER_SITE_OTHER): Payer: 59 | Admitting: Physician Assistant

## 2014-09-21 DIAGNOSIS — A530 Latent syphilis, unspecified as early or late: Secondary | ICD-10-CM

## 2014-09-25 ENCOUNTER — Other Ambulatory Visit: Payer: Self-pay | Admitting: Obstetrics and Gynecology

## 2014-09-25 DIAGNOSIS — Z1231 Encounter for screening mammogram for malignant neoplasm of breast: Secondary | ICD-10-CM

## 2014-09-27 ENCOUNTER — Ambulatory Visit (INDEPENDENT_AMBULATORY_CARE_PROVIDER_SITE_OTHER): Payer: 59 | Admitting: Pharmacist Clinician (PhC)/ Clinical Pharmacy Specialist

## 2014-09-27 DIAGNOSIS — I359 Nonrheumatic aortic valve disorder, unspecified: Secondary | ICD-10-CM

## 2014-09-27 DIAGNOSIS — Z952 Presence of prosthetic heart valve: Secondary | ICD-10-CM

## 2014-09-27 DIAGNOSIS — Z954 Presence of other heart-valve replacement: Secondary | ICD-10-CM

## 2014-09-27 DIAGNOSIS — Z7901 Long term (current) use of anticoagulants: Secondary | ICD-10-CM

## 2014-09-27 LAB — POCT INR: INR: 2.2

## 2014-10-02 ENCOUNTER — Ambulatory Visit: Payer: Self-pay

## 2014-10-04 ENCOUNTER — Other Ambulatory Visit: Payer: Self-pay | Admitting: Obstetrics and Gynecology

## 2014-10-04 ENCOUNTER — Ambulatory Visit
Admission: RE | Admit: 2014-10-04 | Discharge: 2014-10-04 | Disposition: A | Discharge status: Home / Self Care | Payer: 59 | Source: Ambulatory Visit | Attending: Obstetrics and Gynecology | Admitting: Obstetrics and Gynecology

## 2014-10-04 DIAGNOSIS — Z1231 Encounter for screening mammogram for malignant neoplasm of breast: Secondary | ICD-10-CM

## 2014-10-18 ENCOUNTER — Ambulatory Visit: Payer: 59 | Admitting: Pharmacist Clinician (PhC)/ Clinical Pharmacy Specialist

## 2014-10-30 ENCOUNTER — Ambulatory Visit (INDEPENDENT_AMBULATORY_CARE_PROVIDER_SITE_OTHER): Payer: 59 | Admitting: Pharmacist Clinician (PhC)/ Clinical Pharmacy Specialist

## 2014-10-30 DIAGNOSIS — Z952 Presence of prosthetic heart valve: Secondary | ICD-10-CM

## 2014-10-30 DIAGNOSIS — Z954 Presence of other heart-valve replacement: Secondary | ICD-10-CM

## 2014-10-30 DIAGNOSIS — Z7901 Long term (current) use of anticoagulants: Secondary | ICD-10-CM

## 2014-10-30 DIAGNOSIS — I359 Nonrheumatic aortic valve disorder, unspecified: Secondary | ICD-10-CM

## 2014-10-30 LAB — POCT INR: INR: 3.2

## 2014-11-28 ENCOUNTER — Other Ambulatory Visit: Payer: Self-pay | Admitting: Nurse Practitioner

## 2014-11-28 DIAGNOSIS — R1084 Generalized abdominal pain: Secondary | ICD-10-CM

## 2014-12-04 ENCOUNTER — Ambulatory Visit (INDEPENDENT_AMBULATORY_CARE_PROVIDER_SITE_OTHER): Payer: 59 | Admitting: Pharmacist Clinician (PhC)/ Clinical Pharmacy Specialist

## 2014-12-04 DIAGNOSIS — Z954 Presence of other heart-valve replacement: Secondary | ICD-10-CM

## 2014-12-04 DIAGNOSIS — I359 Nonrheumatic aortic valve disorder, unspecified: Secondary | ICD-10-CM

## 2014-12-04 DIAGNOSIS — Z952 Presence of prosthetic heart valve: Secondary | ICD-10-CM

## 2014-12-04 DIAGNOSIS — Z7901 Long term (current) use of anticoagulants: Secondary | ICD-10-CM

## 2014-12-04 LAB — POCT INR: INR: 3.1

## 2014-12-06 ENCOUNTER — Ambulatory Visit
Admission: RE | Admit: 2014-12-06 | Discharge: 2014-12-06 | Disposition: A | Discharge status: Home / Self Care | Payer: 59 | Source: Ambulatory Visit | Attending: Nurse Practitioner | Admitting: Nurse Practitioner

## 2014-12-06 DIAGNOSIS — R1084 Generalized abdominal pain: Secondary | ICD-10-CM

## 2014-12-07 ENCOUNTER — Emergency Department (HOSPITAL_COMMUNITY): Payer: 59

## 2014-12-07 ENCOUNTER — Other Ambulatory Visit: Payer: Self-pay | Admitting: Physician Assistant

## 2014-12-07 ENCOUNTER — Encounter (HOSPITAL_COMMUNITY): Payer: Self-pay

## 2014-12-07 ENCOUNTER — Emergency Department (HOSPITAL_COMMUNITY)
Admission: EM | Admit: 2014-12-07 | Discharge: 2014-12-07 | Disposition: A | Discharge status: Home / Self Care | Payer: 59 | Attending: Emergency Medicine | Admitting: Emergency Medicine

## 2014-12-07 DIAGNOSIS — Z8709 Personal history of other diseases of the respiratory system: Secondary | ICD-10-CM | POA: Diagnosis not present

## 2014-12-07 DIAGNOSIS — S01111A Laceration without foreign body of right eyelid and periocular area, initial encounter: Secondary | ICD-10-CM | POA: Diagnosis not present

## 2014-12-07 DIAGNOSIS — Z7901 Long term (current) use of anticoagulants: Secondary | ICD-10-CM | POA: Diagnosis not present

## 2014-12-07 DIAGNOSIS — Z8619 Personal history of other infectious and parasitic diseases: Secondary | ICD-10-CM | POA: Diagnosis not present

## 2014-12-07 DIAGNOSIS — Y9389 Activity, other specified: Secondary | ICD-10-CM | POA: Insufficient documentation

## 2014-12-07 DIAGNOSIS — Z79899 Other long term (current) drug therapy: Secondary | ICD-10-CM | POA: Insufficient documentation

## 2014-12-07 DIAGNOSIS — Z3202 Encounter for pregnancy test, result negative: Secondary | ICD-10-CM | POA: Diagnosis not present

## 2014-12-07 DIAGNOSIS — Z8679 Personal history of other diseases of the circulatory system: Secondary | ICD-10-CM | POA: Diagnosis not present

## 2014-12-07 DIAGNOSIS — W2203XA Walked into furniture, initial encounter: Secondary | ICD-10-CM | POA: Diagnosis not present

## 2014-12-07 DIAGNOSIS — Y998 Other external cause status: Secondary | ICD-10-CM | POA: Diagnosis not present

## 2014-12-07 DIAGNOSIS — Y9289 Other specified places as the place of occurrence of the external cause: Secondary | ICD-10-CM | POA: Diagnosis not present

## 2014-12-07 DIAGNOSIS — R55 Syncope and collapse: Secondary | ICD-10-CM

## 2014-12-07 DIAGNOSIS — Z954 Presence of other heart-valve replacement: Secondary | ICD-10-CM | POA: Diagnosis not present

## 2014-12-07 LAB — CBC
HCT: 32.3 % — ABNORMAL LOW (ref 36.0–46.0)
Hemoglobin: 10 g/dL — ABNORMAL LOW (ref 12.0–15.0)
MCH: 24.4 pg — AB (ref 26.0–34.0)
MCHC: 31 g/dL (ref 30.0–36.0)
MCV: 78.8 fL (ref 78.0–100.0)
PLATELETS: 270 10*3/uL (ref 150–400)
RBC: 4.1 MIL/uL (ref 3.87–5.11)
RDW: 18.4 % — ABNORMAL HIGH (ref 11.5–15.5)
WBC: 4.3 10*3/uL (ref 4.0–10.5)

## 2014-12-07 LAB — BASIC METABOLIC PANEL
Anion gap: 3 — ABNORMAL LOW (ref 5–15)
BUN: 16 mg/dL (ref 6–23)
CHLORIDE: 105 mmol/L (ref 96–112)
CO2: 27 mmol/L (ref 19–32)
Calcium: 8.8 mg/dL (ref 8.4–10.5)
Creatinine, Ser: 0.58 mg/dL (ref 0.50–1.10)
GFR calc non Af Amer: >90 mL/min (ref >90)
Glucose, Bld: 128 mg/dL — ABNORMAL HIGH (ref 70–99)
Potassium: 4.2 mmol/L (ref 3.5–5.1)
Sodium: 135 mmol/L (ref 135–145)

## 2014-12-07 LAB — I-STAT CHEM 8, ED
BUN: 19 mg/dL (ref 6–23)
Calcium, Ion: 1.24 mmol/L — ABNORMAL HIGH (ref 1.12–1.23)
Chloride: 104 mmol/L (ref 96–112)
Creatinine, Ser: 0.6 mg/dL (ref 0.50–1.10)
GLUCOSE: 124 mg/dL — AB (ref 70–99)
HEMATOCRIT: 37 % (ref 36.0–46.0)
Hemoglobin: 12.6 g/dL (ref 12.0–15.0)
Potassium: 4.4 mmol/L (ref 3.5–5.1)
Sodium: 139 mmol/L (ref 135–145)
TCO2: 23 mmol/L (ref 0–100)

## 2014-12-07 LAB — POC URINE PREG, ED: Preg Test, Ur: NEGATIVE

## 2014-12-07 LAB — CBG MONITORING, ED: Glucose-Capillary: 101 mg/dL — ABNORMAL HIGH (ref 70–99)

## 2014-12-07 LAB — PREGNANCY, URINE: PREG TEST UR: NEGATIVE

## 2014-12-07 LAB — I-STAT TROPONIN, ED: Troponin i, poc: 0 ng/mL (ref 0.00–0.08)

## 2014-12-07 MED ORDER — BUPIVACAINE-EPINEPHRINE (PF) 0.25% -1:200000 IJ SOLN
10.0000 mL | Freq: Once | INTRAMUSCULAR | Status: AC
  Administered 2014-12-07: 10 mL
  Filled 2014-12-07: qty 30

## 2014-12-07 MED ORDER — SODIUM CHLORIDE 0.9 % IV BOLUS (SEPSIS)
1000.0000 mL | Freq: Once | INTRAVENOUS | Status: AC
  Administered 2014-12-07: 1000 mL via INTRAVENOUS

## 2014-12-07 NOTE — Consult Note (Signed)
CARDIOLOGY CONSULT NOTE     Patient ID: [name] MRN: [id number] DOB/AGE: 42-Mar-1972 42 y.o.  Admit date: 12/07/2014 Referring Physician Thayer Jew MD Primary Physician Lamar Blinks, MD Primary Cardiologist Lella Mullany Martinique MD Reason for Consultation syncope  HPI:  44 yo asian female well known to me. She is s/p AVR in 2006 with a #21 mm On-X mechanical valve. She has a history of chronic mild dyspnea since then with negative cardiac and pulmonary evaluation. She is on chronic coumadin. 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 She said it hurt and she sat down and put her head on the table. The next thing she knew she was lying on the ground and multiple people were standing around her. She had a laceration on her right eyebrow. She states she had a similar passing out over a year ago. She states she feels fine now without dizziness, chest pain, palpitations, or dyspnea. She has been in good health otherwise. Coumadin checks have been therapeutic.   Past Medical History  Diagnosis Date  . Severe aortic insufficiency 09/01/2004    s/p aortic valve replacement.    . Femur fracture, left 1990    with open reduction and internal fixation   . H/O: rheumatic fever   . History of allergic rhinitis   . Hepatitis B carrier     positive hepatitis B carrier; maintained on Baraclude; followed by Hepatitis clinic.    Family History  Problem Relation Age of Onset  . Heart disease Brother     valvular dysfunction; s/p replacement  . Hypertension Mother   . Hyperlipidemia Mother   . Hypothyroidism Mother   . Heart disease Father   . Hypertension Father   . Hyperlipidemia Father     History   Social History  . Marital Status: Married    Spouse Name: N/A  . Number of Children: 3  . Years of Education: N/A   Occupational History  . manicure    Social History Main Topics  . Smoking status: Never Smoker   . Smokeless tobacco: Not on file  . Alcohol  Use: No  . Drug Use: No  . Sexual Activity:    Partners: Male   Other Topics Concern  . Not on file   Social History Narrative   Marital status: married x 18 years; happily married; no abuse.  From [place]; [place] since 1993.      Children: 3 boys (17, 75, 98)      Lives: with husband, 3 sons      Employment:  Designer, multimedia at [employer].      Tobacco: none      Alcohol: none      Drugs: none      Exercise: walking x 3-4 days; 30 minutes    Past Surgical History  Procedure Laterality Date  . Aortic valve replacement  4/06    5mm On-X mechanical valve  . Hemorrhoid surgery    . Cardiac catheterization  11/15/2004    Ejection fraction is estimated 55%       Medication List    ASK your doctor about these medications        entecavir 1 MG tablet  Commonly known as:  BARACLUDE  Take 1 mg by mouth daily.     ferrous sulfate 325 (65 FE) MG tablet  Take 325 mg by mouth daily with breakfast.     omeprazole 20 MG capsule  Commonly known as:  PRILOSEC  Take 1 capsule (20 mg total) by mouth daily.     ONE DAILY MULTIVITAMIN WOMEN PO  Take 1 tablet by mouth daily.     warfarin 4 MG tablet  Commonly known as:  COUMADIN  Take 1 to 1.5 tablets by mouth daily as directed by coumadin clinic          ROS: As noted in HPI. All other systems are reviewed and are negative unless otherwise mentioned.   Physical Exam: Blood pressure 127/73, pulse 74, temperature 98.3 F (36.8 C), temperature source Oral, resp. rate 16, height 5\' 5"  (1.651 m), weight 130 lb (58.968 kg), last menstrual period 11/28/2014, SpO2 100 %. Current Weight  12/07/14 130 lb (58.968 kg)  08/29/14 131 lb 12.8 oz (59.784 kg)  03/06/14 129 lb 3.2 oz (58.605 kg)    GENERAL:  Well appearing asian female in NAD HEENT:  PERRL, EOMI, sclera are clear. Oropharynx is clear. Laceration of right eyebrow with stitches in place.  NECK:  No jugular venous distention, carotid upstroke brisk and symmetric, no bruits, no  thyromegaly or adenopathy LUNGS:  Clear to auscultation bilaterally CHEST:  Unremarkable HEART:  RRR,  PMI not displaced or sustained,S1 within normal limits, mechanical S2. no S3, no S4: no clicks, no rubs, no murmurs ABD:  Soft, nontender. BS +, no masses or bruits. No hepatomegaly, no splenomegaly EXT:  2 + pulses throughout, no edema, no cyanosis no clubbing SKIN:  Warm and dry.  No rashes NEURO:  Alert and oriented x 3. Cranial nerves II through XII intact. PSYCH:  Cognitively intact  Orthostatic vitals:  Supine: HR-73, BP 109/71 Sitting: HR- 77, BP 118/73 Standing: HR 84, BP 113/49  Labs:   Lab Results  Component Value Date   WBC 4.3 12/07/2014   HGB 12.6 12/07/2014   HCT 37.0 12/07/2014   MCV 78.8 12/07/2014   PLT 270 12/07/2014    Recent Labs Lab 12/07/14 1159 12/07/14 1209  NA 135 139  K 4.2 4.4  CL 105 104  CO2 27  --   BUN 16 19  CREATININE 0.58 0.60  CALCIUM 8.8  --   GLUCOSE 128* 124*   No results found for: CKTOTAL, CKMB, CKMBINDEX, TROPONINI Lab Results  Component Value Date   CHOL 161 08/29/2014   CHOL 160 07/06/2013   CHOL 189 04/28/2012   Lab Results  Component Value Date   HDL 46 08/29/2014   HDL 44 07/06/2013   HDL 52 04/28/2012   Lab Results  Component Value Date   LDLCALC 98 08/29/2014   LDLCALC 104* 07/06/2013   LDLCALC 120* 04/28/2012   Lab Results  Component Value Date   TRIG 87 08/29/2014   TRIG 58 07/06/2013   TRIG 83 04/28/2012   Lab Results  Component Value Date   CHOLHDL 3.5 08/29/2014   CHOLHDL 3.6 07/06/2013   CHOLHDL 3.6 04/28/2012   No results found for: LDLDIRECT  No results found for: PROBNP Lab Results  Component Value Date   TSH 1.195 08/29/2014   Lab Results  Component Value Date   HGBA1C 5.5 07/06/2013    Radiology: Ct Head Wo Contrast  12/07/2014   CLINICAL DATA:  Syncopal episode at work, struck her head, loss of consciousness for 10 minutes, RIGHT supraorbital laceration  EXAM: CT HEAD WITHOUT  CONTRAST  TECHNIQUE: Contiguous axial images were obtained from the base of the skull through the vertex without intravenous contrast.  COMPARISON:  None  FINDINGS: Normal ventricular morphology.  No  midline shift or mass effect.  Normal appearance of brain parenchyma.  No intracranial hemorrhage, mass lesion, or evidence acute infarction.  No extra-axial fluid collections.  Visualized paranasal sinuses and mastoid air cells clear.  No acute osseous findings.  Intraorbital and periorbital soft tissues unremarkable.  IMPRESSION: Normal exam.   Electronically Signed   By: Lavonia Dana M.D.   On: 12/07/2014 12:59   US Abdomen Limited Ruq  12/06/2014   CLINICAL DATA:  Abdominal pain.  EXAM: US ABDOMEN LIMITED - RIGHT UPPER QUADRANT  COMPARISON:  Ultrasound 03/02/2014.  FINDINGS: Gallbladder:  No gallstones or wall thickening visualized. No sonographic Murphy sign noted.  Common bile duct:  Diameter: 3.2 mm  Liver:  Liver has a slightly heterogeneous parenchymal pattern. This could be from fatty infiltration and/or hepatocellular disease. No focal hepatic abnormality identified.  IMPRESSION: There has a slightly heterogeneous parenchymal pattern. This could be from fatty infiltration and/or hepatocellular disease. No focal hepatic abnormality identified. No gallstones.   Electronically Signed   By: Marcello Moores  Register   On: 12/06/2014 09:59    EKG: NSR with normal Ecg. I have personally reviewed and interpreted this study.   ASSESSMENT AND PLAN:  1. Syncope due to vasovagal episode following injury to her knee 2. Laceration right eyebrow.  3. S/p AVR 4. Hepatitis B.  Plan: she is stable and not orthostatic. Cardiac exam is normal. She is stable for DC to home. Will arrange follow up Echo and event monitor as an outpatient.    Signed: Roniqua Kintz [place], [place]  12/07/2014, 6:12 PM

## 2014-12-07 NOTE — Discharge Instructions (Signed)
Syncope °Syncope is a medical term for fainting or passing out. This means you lose consciousness and drop to the ground. People are generally unconscious for less than 5 minutes. You may have some muscle twitches for up to 15 seconds before waking up and returning to normal. Syncope occurs more often in older adults, but it can happen to anyone. While most causes of syncope are not dangerous, syncope can be a sign of a serious medical problem. It is important to seek medical care.  °CAUSES  °Syncope is caused by a sudden drop in blood flow to the brain. The specific cause is often not determined. Factors that can bring on syncope include: °· Taking medicines that lower blood pressure. °· Sudden changes in posture, such as standing up quickly. °· Taking more medicine than prescribed. °· Standing in one place for too long. °· Seizure disorders. °· Dehydration and excessive exposure to heat. °· Low blood sugar (hypoglycemia). °· Straining to have a bowel movement. °· Heart disease, irregular heartbeat, or other circulatory problems. °· Fear, emotional distress, seeing blood, or severe pain. °SYMPTOMS  °Right before fainting, you may: °· Feel dizzy or light-headed. °· Feel nauseous. °· See all white or all black in your field of vision. °· Have cold, clammy skin. °DIAGNOSIS  °Your health care provider will ask about your symptoms, perform a physical exam, and perform an electrocardiogram (ECG) to record the electrical activity of your heart. Your health care provider may also perform other heart or blood tests to determine the cause of your syncope which may include: °· Transthoracic echocardiogram (TTE). During echocardiography, sound waves are used to evaluate how blood flows through your heart. °· Transesophageal echocardiogram (TEE). °· Cardiac monitoring. This allows your health care provider to monitor your heart rate and rhythm in real time. °· Holter monitor. This is a portable device that records your  heartbeat and can help diagnose heart arrhythmias. It allows your health care provider to track your heart activity for several days, if needed. °· Stress tests by exercise or by giving medicine that makes the heart beat faster. °TREATMENT  °In most cases, no treatment is needed. Depending on the cause of your syncope, your health care provider may recommend changing or stopping some of your medicines. °HOME CARE INSTRUCTIONS °· Have someone stay with you until you feel stable. °· Do not drive, use machinery, or play sports until your health care provider says it is okay. °· Keep all follow-up appointments as directed by your health care provider. °· Lie down right away if you start feeling like you might faint. Breathe deeply and steadily. Wait until all the symptoms have passed. °· Drink enough fluids to keep your urine clear or pale yellow. °· If you are taking blood pressure or heart medicine, get up slowly and take several minutes to sit and then stand. This can reduce dizziness. °SEEK IMMEDIATE MEDICAL CARE IF:  °· You have a severe headache. °· You have unusual pain in the chest, abdomen, or back. °· You are bleeding from your mouth or rectum, or you have black or tarry stool. °· You have an irregular or very fast heartbeat. °· You have pain with breathing. °· You have repeated fainting or seizure-like jerking during an episode. °· You faint when sitting or lying down. °· You have confusion. °· You have trouble walking. °· You have severe weakness. °· You have vision problems. °If you fainted, call your local emergency services (911 in [place] Do not drive   yourself to the hospital.  °MAKE SURE YOU: °· Understand these instructions. °· Will watch your condition. °· Will get help right away if you are not doing well or get worse. °Document Released: 08/18/2005 Document Revised: 08/23/2013 Document Reviewed: 10/17/2011 °[employer]® Patient Information ©2015 [employer]. This information is not intended to replace  advice given to you by your health care provider. Make sure you discuss any questions you have with your health care provider. ° °

## 2014-12-07 NOTE — ED Notes (Signed)
Pt presents with syncopal episode this morning.  Pt reports she was seated, hit her knee on table, became dizzy and fell into table.  Husband reports pt was unconscious x 10 minutes.

## 2014-12-07 NOTE — ED Provider Notes (Signed)
CSN: [id number]     Arrival date & time 12/07/14  1137 History   First MD Initiated Contact with Patient 12/07/14 1354     Chief Complaint  Patient presents with  . Loss of Consciousness     (Consider location/radiation/quality/duration/timing/severity/associated sxs/prior Treatment) Patient is a 44 y.o. female presenting with syncope. The history is provided by the patient. No language interpreter was used.  Loss of Consciousness Associated symptoms: no dizziness and no weakness   [name] is a 44 y.o asian female with a history of aortic valve replacement and cardiac cath in 2006 who presents for sudden onset syncopal episode that occurred just prior to arrival and lasted 5 minutes.  She was at the nail salon when she hit her knee on a table.  She was in a seated position and then passed out onto the edge of a table hitting her head.  She denies any aura prior to passing out. She states this has happened to her at least 4 different times in the past few months and she has informed her pcp but has not had a cardiac workup.   She denies any fever, diaphoresis, dizziness, nausea, vomiting, chest pain, shortness of breath, abdominal pain, dysuria, hematuria, or leg swelling.   Past Medical History  Diagnosis Date  . Severe aortic insufficiency 09/01/2004    s/p aortic valve replacement.    . Femur fracture, left 1990    with open reduction and internal fixation   . H/O: rheumatic fever   . History of allergic rhinitis   . Hepatitis B carrier     positive hepatitis B carrier; maintained on Baraclude; followed by Hepatitis clinic.   Past Surgical History  Procedure Laterality Date  . Aortic valve replacement  4/06    28mm On-X mechanical valve  . Hemorrhoid surgery    . Cardiac catheterization  11/15/2004    Ejection fraction is estimated 55%   Family History  Problem Relation Age of Onset  . Heart disease Brother     valvular dysfunction; s/p replacement  . Hypertension Mother   .  Hyperlipidemia Mother   . Hypothyroidism Mother   . Heart disease Father   . Hypertension Father   . Hyperlipidemia Father    History  Substance Use Topics  . Smoking status: Never Smoker   . Smokeless tobacco: Not on file  . Alcohol Use: No   OB History    No data available     Review of Systems  Eyes: Negative for photophobia and visual disturbance.  Respiratory: Negative for cough.   Cardiovascular: Positive for syncope.  Gastrointestinal: Negative for diarrhea.  Neurological: Negative for dizziness, weakness, light-headedness and numbness.  All other systems reviewed and are negative.     Allergies  Review of patient's allergies indicates no known allergies.  Home Medications   Prior to Admission medications   Medication Sig Start Date End Date Taking? Authorizing Provider  entecavir (BARACLUDE) 1 MG tablet Take 1 mg by mouth daily.   Yes Historical Provider, MD  ferrous sulfate 325 (65 FE) MG tablet Take 325 mg by mouth daily with breakfast.   Yes Historical Provider, MD  Multiple Vitamins-Minerals (ONE DAILY MULTIVITAMIN WOMEN PO) Take 1 tablet by mouth daily.   Yes Historical Provider, MD  omeprazole (PRILOSEC) 20 MG capsule Take 1 capsule (20 mg total) by mouth daily. 07/06/13  Yes Wardell Honour, MD  warfarin (COUMADIN) 4 MG tablet Take 1 to 1.5 tablets by mouth daily as directed  by coumadin clinic 08/30/14  Yes Peter M Martinique, MD   BP 106/64 mmHg  Pulse 73  Temp(Src) 98.3 F (36.8 C) (Oral)  Resp 17  Ht 5\' 5"  (1.651 m)  Wt 130 lb (58.968 kg)  BMI 21.63 kg/m2  SpO2 100%  LMP 11/28/2014 (Approximate) Physical Exam  Constitutional: She is oriented to person, place, and time. She appears well-developed and well-nourished.  HENT:  Right Ear: External ear normal.  Left Ear: External ear normal.  Mouth/Throat: Oropharynx is clear and moist.  She has a 1cm laceration at the end of the right eyebrow.   Eyes: Conjunctivae are normal.  Neck: Normal range of  motion. Neck supple.  Cardiovascular: Normal rate, regular rhythm and normal heart sounds.   Pulmonary/Chest: Effort normal and breath sounds normal.  Abdominal: Soft. There is no tenderness.  Musculoskeletal: Normal range of motion.  Neurological: She is alert and oriented to person, place, and time.  Cranial nerves III-XII intact.  Bilateral upper and lower extremity strength is good.  No sensory deficit.  Normal finger to nose.  Normal heel to chin.   Skin: Skin is warm and dry.  Nursing note and vitals reviewed.   ED Course  LACERATION REPAIR Date/Time: 12/07/2014 5:41 PM Performed by: Ottie Glazier Authorized by: Ottie Glazier Consent: Verbal consent obtained. Written consent obtained. Risks and benefits: risks, benefits and alternatives were discussed Consent given by: patient Patient understanding: patient states understanding of the procedure being performed Patient consent: the patient's understanding of the procedure matches consent given Imaging studies: imaging studies available Patient identity confirmed: verbally with patient Body area: head/neck Location details: right eyebrow Laceration length: 1 cm Foreign bodies: no foreign bodies Tendon involvement: none Nerve involvement: none Vascular damage: no Anesthesia: local infiltration Local anesthetic: bupivacaine 0.5% with epinephrine Anesthetic total: 3 ml Patient sedated: no Preparation: Patient was prepped and draped in the usual sterile fashion. Irrigation solution: saline Irrigation method: syringe Amount of cleaning: standard Debridement: none Degree of undermining: none Skin closure: 5-0 Prolene Number of sutures: 4 Technique: simple Approximation: close Approximation difficulty: simple Dressing: antibiotic ointment Patient tolerance: Patient tolerated the procedure well with no immediate complications   (including critical care time) Labs Review Labs Reviewed  CBC - Abnormal; Notable for  the following:    Hemoglobin 10.0 (*)    HCT 32.3 (*)    MCH 24.4 (*)    RDW 18.4 (*)    All other components within normal limits  BASIC METABOLIC PANEL - Abnormal; Notable for the following:    Glucose, Bld 128 (*)    Anion gap 3 (*)    All other components within normal limits  CBG MONITORING, ED - Abnormal; Notable for the following:    Glucose-Capillary 101 (*)    All other components within normal limits  I-STAT CHEM 8, ED - Abnormal; Notable for the following:    Glucose, Bld 124 (*)    Calcium, Ion 1.24 (*)    All other components within normal limits  PREGNANCY, URINE  POC URINE PREG, ED    Imaging Review Ct Head Wo Contrast  12/07/2014   CLINICAL DATA:  Syncopal episode at work, struck her head, loss of consciousness for 10 minutes, RIGHT supraorbital laceration  EXAM: CT HEAD WITHOUT CONTRAST  TECHNIQUE: Contiguous axial images were obtained from the base of the skull through the vertex without intravenous contrast.  COMPARISON:  None  FINDINGS: Normal ventricular morphology.  No midline shift or mass effect.  Normal appearance of  brain parenchyma.  No intracranial hemorrhage, mass lesion, or evidence acute infarction.  No extra-axial fluid collections.  Visualized paranasal sinuses and mastoid air cells clear.  No acute osseous findings.  Intraorbital and periorbital soft tissues unremarkable.  IMPRESSION: Normal exam.   Electronically Signed   By: Lavonia Dana M.D.   On: 12/07/2014 12:59   US Abdomen Limited Ruq  12/06/2014   CLINICAL DATA:  Abdominal pain.  EXAM: US ABDOMEN LIMITED - RIGHT UPPER QUADRANT  COMPARISON:  Ultrasound 03/02/2014.  FINDINGS: Gallbladder:  No gallstones or wall thickening visualized. No sonographic Murphy sign noted.  Common bile duct:  Diameter: 3.2 mm  Liver:  Liver has a slightly heterogeneous parenchymal pattern. This could be from fatty infiltration and/or hepatocellular disease. No focal hepatic abnormality identified.  IMPRESSION: There has a  slightly heterogeneous parenchymal pattern. This could be from fatty infiltration and/or hepatocellular disease. No focal hepatic abnormality identified. No gallstones.   Electronically Signed   By: Marcello Moores  Register   On: 12/06/2014 09:59     EKG Interpretation   Date/Time:  Thursday December 07 2014 11:53:00 EDT Ventricular Rate:  61 PR Interval:  150 QRS Duration: 80 QT Interval:  412 QTC Calculation: 414 R Axis:   78 Text Interpretation:  Normal sinus rhythm Normal ECG Confirmed by HORTON   MD, Loma Sousa ([id number]) on 12/07/2014 2:23:23 PM      MDM   Final diagnoses:  Syncope, unspecified syncope type  Patient with history of AVR presents for syncopal episode just prior to arrival. She was at the nail salon when she hit her knee on the desk.  She passed out from a seated position hitting her head on the corner of the table. She did not regain consciousness for 5 minutes according to witnesses. She has a history of four prior syncopal episodes in the past few months. She has a 1cm laceration at the end of the right eyebrow.  Her neuro exam is normal.  Her CT head shows no intracranial hemorrhage, no mass lesion and no evidence of acute infarction. Negative pregnancy.  EKG is normal 16:26 I spoke to [facility] and cardiology will see the patient in ED.   I have discussed this patient with Dr. Jola Schmidt who will discharge the patient upon cardiology seeing the patient in the ED.       Ottie Glazier, PA-C 12/07/14 1916  Merryl Hacker, MD 12/07/14 1919

## 2014-12-08 ENCOUNTER — Telehealth: Payer: Self-pay | Admitting: Emergency Medicine

## 2014-12-08 ENCOUNTER — Telehealth: Payer: Self-pay | Admitting: Cardiology

## 2014-12-08 NOTE — Telephone Encounter (Signed)
Error

## 2014-12-12 ENCOUNTER — Ambulatory Visit (HOSPITAL_COMMUNITY)
Admission: RE | Admit: 2014-12-12 | Discharge: 2014-12-12 | Disposition: A | Discharge status: Home / Self Care | Payer: 59 | Source: Ambulatory Visit | Attending: Physician Assistant | Admitting: Physician Assistant

## 2014-12-12 DIAGNOSIS — I340 Nonrheumatic mitral (valve) insufficiency: Secondary | ICD-10-CM | POA: Insufficient documentation

## 2014-12-12 DIAGNOSIS — Z952 Presence of prosthetic heart valve: Secondary | ICD-10-CM | POA: Insufficient documentation

## 2014-12-12 DIAGNOSIS — R55 Syncope and collapse: Secondary | ICD-10-CM | POA: Diagnosis present

## 2014-12-12 NOTE — Progress Notes (Signed)
2D Echo Performed 12/12/2014    Marygrace Drought, RCS

## 2014-12-13 NOTE — Telephone Encounter (Signed)
Closed encounter °

## 2014-12-27 ENCOUNTER — Encounter (INDEPENDENT_AMBULATORY_CARE_PROVIDER_SITE_OTHER): Payer: 59

## 2014-12-27 ENCOUNTER — Encounter: Payer: Self-pay | Admitting: *Deleted

## 2014-12-27 DIAGNOSIS — R55 Syncope and collapse: Secondary | ICD-10-CM

## 2014-12-27 NOTE — Progress Notes (Signed)
Patient ID: [name], female   DOB: 1970/10/30, 44 y.o.   MRN: [id number] [employer] 30 day cardiac event monitor applied to patient.

## 2015-01-15 ENCOUNTER — Ambulatory Visit (INDEPENDENT_AMBULATORY_CARE_PROVIDER_SITE_OTHER): Payer: 59 | Admitting: Pharmacist Clinician (PhC)/ Clinical Pharmacy Specialist

## 2015-01-15 DIAGNOSIS — Z954 Presence of other heart-valve replacement: Secondary | ICD-10-CM | POA: Diagnosis not present

## 2015-01-15 DIAGNOSIS — Z952 Presence of prosthetic heart valve: Secondary | ICD-10-CM

## 2015-01-15 LAB — POCT INR: INR: 3.3

## 2015-02-08 ENCOUNTER — Other Ambulatory Visit: Payer: Self-pay | Admitting: Cardiology

## 2015-02-08 NOTE — Telephone Encounter (Signed)
Forward to [place] [facility]

## 2015-02-13 ENCOUNTER — Telehealth: Payer: Self-pay

## 2015-02-13 NOTE — Telephone Encounter (Signed)
Patient called no answer.Left message to call me back for monitor results.

## 2015-02-28 ENCOUNTER — Ambulatory Visit: Payer: 59 | Admitting: Pharmacist Clinician (PhC)/ Clinical Pharmacy Specialist

## 2015-02-28 ENCOUNTER — Ambulatory Visit (INDEPENDENT_AMBULATORY_CARE_PROVIDER_SITE_OTHER): Payer: 59 | Admitting: Pharmacist Clinician (PhC)/ Clinical Pharmacy Specialist

## 2015-02-28 DIAGNOSIS — Z954 Presence of other heart-valve replacement: Secondary | ICD-10-CM | POA: Diagnosis not present

## 2015-02-28 DIAGNOSIS — Z7901 Long term (current) use of anticoagulants: Secondary | ICD-10-CM | POA: Diagnosis not present

## 2015-02-28 DIAGNOSIS — Z952 Presence of prosthetic heart valve: Secondary | ICD-10-CM

## 2015-02-28 DIAGNOSIS — I359 Nonrheumatic aortic valve disorder, unspecified: Secondary | ICD-10-CM | POA: Diagnosis not present

## 2015-02-28 LAB — POCT INR: INR: 3

## 2015-04-11 ENCOUNTER — Ambulatory Visit: Payer: 59 | Admitting: Pharmacist Clinician (PhC)/ Clinical Pharmacy Specialist

## 2015-04-16 ENCOUNTER — Ambulatory Visit (INDEPENDENT_AMBULATORY_CARE_PROVIDER_SITE_OTHER): Payer: 59 | Admitting: Pharmacist Clinician (PhC)/ Clinical Pharmacy Specialist

## 2015-04-16 DIAGNOSIS — I359 Nonrheumatic aortic valve disorder, unspecified: Secondary | ICD-10-CM

## 2015-04-16 DIAGNOSIS — Z954 Presence of other heart-valve replacement: Secondary | ICD-10-CM | POA: Diagnosis not present

## 2015-04-16 DIAGNOSIS — Z952 Presence of prosthetic heart valve: Secondary | ICD-10-CM

## 2015-04-16 DIAGNOSIS — Z7901 Long term (current) use of anticoagulants: Secondary | ICD-10-CM | POA: Diagnosis not present

## 2015-04-16 LAB — POCT INR: INR: 3.5

## 2015-05-02 ENCOUNTER — Encounter: Payer: Self-pay | Admitting: Cardiology

## 2015-05-02 ENCOUNTER — Ambulatory Visit (INDEPENDENT_AMBULATORY_CARE_PROVIDER_SITE_OTHER): Payer: 59 | Admitting: Cardiology

## 2015-05-02 VITALS — BP 102/70 | HR 71 | Ht 65.0 in | Wt 134.9 lb

## 2015-05-02 DIAGNOSIS — Z7901 Long term (current) use of anticoagulants: Secondary | ICD-10-CM

## 2015-05-02 DIAGNOSIS — Z954 Presence of other heart-valve replacement: Secondary | ICD-10-CM | POA: Diagnosis not present

## 2015-05-02 DIAGNOSIS — I359 Nonrheumatic aortic valve disorder, unspecified: Secondary | ICD-10-CM

## 2015-05-02 DIAGNOSIS — R55 Syncope and collapse: Secondary | ICD-10-CM

## 2015-05-02 DIAGNOSIS — Z952 Presence of prosthetic heart valve: Secondary | ICD-10-CM

## 2015-05-02 NOTE — Progress Notes (Signed)
[name] Date of Birth: 02/27/1971 Medical Record #[id number]  History of Present Illness: [name] is seen today for followup. She has a history of severe aortic insufficiency and underwent aortic valve replacement with a mechanical prosthesis in April of 2006. This was a #21 mm On-X valve. She does have some chronic mild dyspnea. She's had extensive pulmonary and cardiac evaluation for this which has been unremarkable. She is on chronic anticoagulation. She does have a history of hepatitis B and is being treated by Dr. Patsy Chang.  She was seen in the ED in early April following a syncopal episode. She hit her knee on a table then passed out. CT head was unremarkable. Other labs ok. Subsequent event monitor and Echo were normal. On follow up today she denies any recurrent syncope. She does still have some chronic dyspnea at times- often at rest.   Current Outpatient Prescriptions on File Prior to Visit  Medication Sig Dispense Refill  . entecavir (BARACLUDE) 1 MG tablet Take 1 mg by mouth daily.    . Multiple Vitamins-Minerals (ONE DAILY MULTIVITAMIN WOMEN PO) Take 1 tablet by mouth as needed.     Marland Kitchen omeprazole (PRILOSEC) 20 MG capsule Take 1 capsule (20 mg total) by mouth daily. (Patient taking differently: Take 20 mg by mouth as needed. ) 30 capsule 11  . warfarin (COUMADIN) 4 MG tablet TAKE 1 TO 1.5 TABLETS BY MOUTH DAILY AS DIRECTED BY COUMADIN CLINIC 45 tablet 2   No current facility-administered medications on file prior to visit.    No Known Allergies  Past Medical History  Diagnosis Date  . Severe aortic insufficiency 09/01/2004    s/p aortic valve replacement.    . Femur fracture, left 1990    with open reduction and internal fixation   . H/O: rheumatic fever   . History of allergic rhinitis   . Hepatitis B carrier     positive hepatitis B carrier; maintained on Baraclude; followed by Hepatitis clinic.    Past Surgical History  Procedure Laterality Date  . Aortic valve  replacement  4/06    64mm On-X mechanical valve  . Hemorrhoid surgery    . Cardiac catheterization  11/15/2004    Ejection fraction is estimated 55%    History  Smoking status  . Never Smoker   Smokeless tobacco  . Not on file    History  Alcohol Use No    Family History  Problem Relation Age of Onset  . Heart disease Brother     valvular dysfunction; s/p replacement  . Hypertension Mother   . Hyperlipidemia Mother   . Hypothyroidism Mother   . Heart disease Father   . Hypertension Father   . Hyperlipidemia Father     Review of Systems: As noted in history of present illness.  All other systems were reviewed and are negative.  Physical Exam: BP 102/70 mmHg  Pulse 71  Ht 5\' 5"  (1.651 m)  Wt 61.19 kg (134 lb 14.4 oz)  BMI 22.45 kg/m2 She is a pleasant, Asian female in no acute distress. HEENT exam is unremarkable. She is no JVD or bruits. Lungs are clear. Cardiac exam reveals a regular rate and rhythm without gallop. She has a good mechanical aortic valve click. Abdomen is soft and nontender. She has no significant edema. Skin is warm and dry. She is alert and oriented x3. Cranial nerves II through XII are intact.  LABORATORY DATA:   INR today is 3.5  Event monitor NSR  Echo  12/12/14:Study Conclusions  - Left ventricle: The cavity size was normal. Wall thickness was normal. Systolic function was normal. The estimated ejection fraction was in the range of 55% to 60%. Left ventricular diastolic function parameters were normal. - Aortic valve: There was a mechanical aortic valve. No significant stenosis. There was trivial regurgitation. Mean gradient (S): 11 mm Hg. - Mitral valve: There was mild regurgitation. - Right ventricle: The cavity size was normal. Systolic function was normal. - Tricuspid valve: Peak RV-RA gradient (S): 22 mm Hg. - Pulmonary arteries: PA peak pressure: 25 mm Hg (S). - Inferior vena cava: The vessel was normal in size.  The respirophasic diameter changes were in the normal range (= 50%), consistent with normal central venous pressure.  Impressions:  - Normal LV size with EF 55-60%. Normal diastolic function. Normally functioning mechanical aortic valve. Mild MR. Normal RV size and systolic function.  Assessment / Plan: 1. Status post mechanical aortic valve replacement for severe aortic insufficiency. Exam is stable. Echo in April shoed good valve function. Continue anticoagulation and routine SBE prophylaxis.  Follow up in 6 months.  2.  Hepatitis B.  3. Syncope probably vasovagal. No recurrence. Will monitor.

## 2015-05-02 NOTE — Patient Instructions (Signed)
Continue your current therapy  I will see you in 6 months.   

## 2015-05-16 ENCOUNTER — Telehealth: Payer: Self-pay | Admitting: Family Medicine

## 2015-05-16 ENCOUNTER — Ambulatory Visit (INDEPENDENT_AMBULATORY_CARE_PROVIDER_SITE_OTHER): Payer: 59 | Admitting: *Deleted

## 2015-05-16 DIAGNOSIS — Z23 Encounter for immunization: Secondary | ICD-10-CM

## 2015-05-16 NOTE — Telephone Encounter (Signed)
[name], came in saying she needs a referral sent to [facility] so she can see the Provider regarding having right shoulder pain. She's wondering if an appointment can be made on her behalf by our staff due to her insurance carrier. Please call the patient if you have any questions or concerns.  Pt ph# [phone] Thank you.

## 2015-05-16 NOTE — Telephone Encounter (Signed)
Patient may need an OV since it doesn't appear that she's ever been seen for shoulder issues.  I just see cardiac-related OVs and her only referral through her insurance was also for cardiac issues.  I would need a shoulder/ortho diagnosis code in order to submit a referral with her insurance.

## 2015-05-17 ENCOUNTER — Telehealth: Payer: Self-pay | Admitting: Family Medicine

## 2015-05-17 NOTE — Telephone Encounter (Signed)
lmom to give us a call back  

## 2015-05-18 NOTE — Telephone Encounter (Signed)
Spoke with pt, advised to RTC. 

## 2015-05-25 ENCOUNTER — Other Ambulatory Visit: Payer: Self-pay | Admitting: Cardiology

## 2015-05-30 ENCOUNTER — Ambulatory Visit: Payer: 59 | Admitting: Pharmacist Clinician (PhC)/ Clinical Pharmacy Specialist

## 2015-06-05 ENCOUNTER — Ambulatory Visit (INDEPENDENT_AMBULATORY_CARE_PROVIDER_SITE_OTHER): Payer: 59 | Admitting: Family Medicine

## 2015-06-05 VITALS — BP 114/68 | HR 77 | Temp 98.0°F | Resp 16 | Ht 65.0 in | Wt 134.2 lb

## 2015-06-05 DIAGNOSIS — M79645 Pain in left finger(s): Secondary | ICD-10-CM | POA: Diagnosis not present

## 2015-06-05 DIAGNOSIS — M1990 Unspecified osteoarthritis, unspecified site: Secondary | ICD-10-CM | POA: Diagnosis not present

## 2015-06-05 DIAGNOSIS — M25511 Pain in right shoulder: Secondary | ICD-10-CM

## 2015-06-05 DIAGNOSIS — B181 Chronic viral hepatitis B without delta-agent: Secondary | ICD-10-CM

## 2015-06-05 MED ORDER — PREDNISONE 20 MG PO TABS: ORAL_TABLET | ORAL | Status: DC

## 2015-06-05 NOTE — Addendum Note (Signed)
Addended by: Robyn Haber on: 06/05/2015 08:34 AM   Modules accepted: Orders

## 2015-06-05 NOTE — Progress Notes (Signed)
° °  Subjective:    Patient ID: [name], female    DOB: 19-Sep-1970, 44 y.o.   MRN: [id number] This chart was scribed for Robyn Haber, MD by Zola Button, Medical Scribe. This patient was seen in Room 1 and the patient's care was started at 8:18 AM.   HPI HPI Comments: [name] is a 44 y.o. female with a history of hepatitis B and aortic valve disorder who presents to the [facility] complaining of gradual onset, waxing and waning left 2nd finger pain that started over 2 weeks ago. She has noticed some stiffness in the finger. She also reports having ongoing right shoulder pain. She has tried Tylenol but without relief. Patient would like a referral to [employer] who she normally sees and prefers Wednesday morning. She has been on coumadin for about 10 years, following an aortic valve replacement surgery after she moved to the [place].  Patient works at a [employer].   Review of Systems  Musculoskeletal: Positive for arthralgias.       Objective:   Physical Exam CONSTITUTIONAL: Well developed/well nourished HEAD: Normocephalic/atraumatic EYES: EOM/PERRL ENMT: Mucous membranes moist NECK: supple no meningeal signs ABDOMEN: soft, nontender, no rebound or guarding GU: no cva tenderness NEURO: Pt is awake/alert, moves all extremitiesx4 EXTREMITIES: pulses normal, full ROM; index finger PIP is mildly swollen but has full range of motion. Patient has pain with extreme internal rotation of the right shoulder and abduction. SKIN: warm, color normal PSYCH: no abnormalities of mood noted        Assessment & Plan:   By signing my name below, I, Zola Button, attest that this documentation has been prepared under the direction and in the presence of Robyn Haber, MD.  Electronically Signed: Zola Button, Medical Scribe. 06/05/2015. 8:18 AM. This chart was scribed in my presence and reviewed by me personally.    ICD-9-CM ICD-10-CM   1. Arthritis 716.90  M19.90 predniSONE (DELTASONE) 20 MG tablet     Ambulatory referral to Orthopedic Surgery  2. Pain in joint of right shoulder 719.41 M25.511 predniSONE (DELTASONE) 20 MG tablet     Ambulatory referral to Orthopedic Surgery  3. Finger pain, left 729.5 M79.645 predniSONE (DELTASONE) 20 MG tablet     Ambulatory referral to Orthopedic Surgery     Signed, Robyn Haber, MD

## 2015-06-05 NOTE — Addendum Note (Signed)
Addended by: Robyn Haber on: 06/05/2015 08:37 AM   Modules accepted: Orders

## 2015-06-06 ENCOUNTER — Ambulatory Visit (INDEPENDENT_AMBULATORY_CARE_PROVIDER_SITE_OTHER): Payer: 59 | Admitting: Pharmacist Clinician (PhC)/ Clinical Pharmacy Specialist

## 2015-06-06 DIAGNOSIS — Z954 Presence of other heart-valve replacement: Secondary | ICD-10-CM

## 2015-06-06 DIAGNOSIS — Z952 Presence of prosthetic heart valve: Secondary | ICD-10-CM

## 2015-06-06 DIAGNOSIS — Z7901 Long term (current) use of anticoagulants: Secondary | ICD-10-CM | POA: Diagnosis not present

## 2015-06-06 DIAGNOSIS — I359 Nonrheumatic aortic valve disorder, unspecified: Secondary | ICD-10-CM | POA: Diagnosis not present

## 2015-06-06 LAB — POCT INR: INR: 4.7

## 2015-06-13 ENCOUNTER — Ambulatory Visit (INDEPENDENT_AMBULATORY_CARE_PROVIDER_SITE_OTHER): Payer: 59 | Admitting: Family Medicine

## 2015-06-13 ENCOUNTER — Encounter: Payer: Self-pay | Admitting: Family Medicine

## 2015-06-13 VITALS — BP 105/68 | HR 89 | Temp 98.0°F | Resp 16 | Ht 63.5 in | Wt 132.8 lb

## 2015-06-13 DIAGNOSIS — Z13220 Encounter for screening for lipoid disorders: Secondary | ICD-10-CM | POA: Diagnosis not present

## 2015-06-13 DIAGNOSIS — Z131 Encounter for screening for diabetes mellitus: Secondary | ICD-10-CM

## 2015-06-13 DIAGNOSIS — G4700 Insomnia, unspecified: Secondary | ICD-10-CM

## 2015-06-13 DIAGNOSIS — Z130 Encounter for screening for diseases of the blood and blood-forming organs and certain disorders involving the immune mechanism: Secondary | ICD-10-CM

## 2015-06-13 DIAGNOSIS — Z0000 Encounter for general adult medical examination without abnormal findings: Secondary | ICD-10-CM | POA: Diagnosis not present

## 2015-06-13 LAB — COMPREHENSIVE METABOLIC PANEL
ALT: 13 U/L (ref 6–29)
AST: 16 U/L (ref 10–30)
Albumin: 3.8 g/dL (ref 3.6–5.1)
Alkaline Phosphatase: 39 U/L (ref 33–115)
BUN: 13 mg/dL (ref 7–25)
CHLORIDE: 107 mmol/L (ref 98–110)
CO2: 26 mmol/L (ref 20–31)
Calcium: 8.9 mg/dL (ref 8.6–10.2)
Creat: 0.49 mg/dL — ABNORMAL LOW (ref 0.50–1.10)
Glucose, Bld: 92 mg/dL (ref 65–99)
Potassium: 4.1 mmol/L (ref 3.5–5.3)
SODIUM: 134 mmol/L — AB (ref 135–146)
TOTAL PROTEIN: 7 g/dL (ref 6.1–8.1)
Total Bilirubin: 0.8 mg/dL (ref 0.2–1.2)

## 2015-06-13 LAB — LIPID PANEL
CHOLESTEROL: 141 mg/dL (ref 125–200)
HDL: 40 mg/dL — AB (ref >=46)
LDL Cholesterol: 76 mg/dL (ref <130)
TRIGLYCERIDES: 126 mg/dL (ref <150)
Total CHOL/HDL Ratio: 3.5 Ratio (ref <=5.0)
VLDL: 25 mg/dL (ref <30)

## 2015-06-13 LAB — CBC
HCT: 37.4 % (ref 36.0–46.0)
HEMOGLOBIN: 12.1 g/dL (ref 12.0–15.0)
MCH: 26.6 pg (ref 26.0–34.0)
MCHC: 32.4 g/dL (ref 30.0–36.0)
MCV: 82.2 fL (ref 78.0–100.0)
MPV: 9.5 fL (ref 8.6–12.4)
Platelets: 297 10*3/uL (ref 150–400)
RBC: 4.55 MIL/uL (ref 3.87–5.11)
RDW: 16 % — ABNORMAL HIGH (ref 11.5–15.5)
WBC: 4.3 10*3/uL (ref 4.0–10.5)

## 2015-06-13 LAB — HEMOGLOBIN A1C
Hgb A1c MFr Bld: 5.6 % (ref <5.7)
Mean Plasma Glucose: 114 mg/dL (ref <117)

## 2015-06-13 MED ORDER — ALPRAZOLAM 0.25 MG PO TABS: ORAL_TABLET | ORAL | Status: DC

## 2015-06-13 NOTE — Patient Instructions (Signed)
I will be in touch with your labs soon Please go ahead with your liver appointment next week as planned There is no permanent cure for insomnia- most of [place] suffer from insomnia some of the time! Keeping a normal bedtime and having a relaxing routine before bed can help You can use the alprazolam for occasional insomnia symptoms- however this medication can be habit forming and is NOT for use every day

## 2015-06-13 NOTE — Progress Notes (Signed)
[facility] [address] [place] [address] [phone]  Date:  06/13/2015   Name:  [name]   DOB:  [birth year]   MRN:  [id number]  PCP:  Lamar Blinks, MD    Chief Complaint: Annual Exam   History of Present Illness:  [name] is a 44 y.o. very pleasant female patient who presents with the following:  Here today for a CPE but no pap needed She is on chronic anticoagulation for an aortic valve issue- had replacement with mechanical valve in 2006.  She does have hep B followed by [facility]- however it seems that they have referred her to a clinic in [facility] for some reason.  Syncopal episode this spring- subsequent eval was ok.   Cardiologist is Dr. Martinique, her INR is followed by cardiology coumadin clinic.    She sees OB/GYN with [place] and is up to date on her mammogram and pap  She is fasting this morning  She notes issues with insomnia over the years- this will come and go with intermittent worsening.  She has had a hard time especially the last 3 days, feels that she cannot get her mind to stop working  She has tried melatonin, benadryl- however notes that benadryl leaves her tired in the am, melatonin does not really work,  her sx seem to come back again, wonders if there is a medication that can cure her insomnia permanently?   She is a non- smoker, non- drinker.  Periods are regular, LMP 10/10. She does not plan any future pregnancy Liver function tests from 08/2014 normal  Patient Active Problem List   Diagnosis Date Noted  . Vasovagal syncope 05/02/2015  . Hepatitis B 04/28/2012  . Aortic valve disorder 02/11/2012  . Long term (current) use of anticoagulants 02/11/2012  . S/P AVR 12/16/2011  . Chronic anticoagulation 12/16/2011  . PULMONARY NODULE 01/31/2010  . DYSPNEA 01/31/2010    Past Medical History  Diagnosis Date  . Severe aortic insufficiency 09/01/2004    s/p aortic valve replacement.    . Femur  fracture, left ([place]) 1990    with open reduction and internal fixation   . H/O: rheumatic fever   . History of allergic rhinitis   . Hepatitis B carrier     positive hepatitis B carrier; maintained on Baraclude; followed by Hepatitis clinic.    Past Surgical History  Procedure Laterality Date  . Aortic valve replacement  4/06    34mm On-X mechanical valve  . Hemorrhoid surgery    . Cardiac catheterization  11/15/2004    Ejection fraction is estimated 55%    Social History  Substance Use Topics  . Smoking status: Never Smoker   . Smokeless tobacco: None  . Alcohol Use: No    Family History  Problem Relation Age of Onset  . Heart disease Brother     valvular dysfunction; s/p replacement  . Hypertension Mother   . Hyperlipidemia Mother   . Hypothyroidism Mother   . Heart disease Father   . Hypertension Father   . Hyperlipidemia Father     Not on File  Medication list has been reviewed and updated.  Current Outpatient Prescriptions on File Prior to Visit  Medication Sig Dispense Refill  . entecavir (BARACLUDE) 1 MG tablet Take 1 mg by mouth daily.    . Multiple Vitamins-Minerals (ONE DAILY MULTIVITAMIN WOMEN PO) Take 1 tablet by mouth as needed.     Marland Kitchen omeprazole (PRILOSEC)  20 MG capsule Take 1 capsule (20 mg total) by mouth daily. 30 capsule 11  . warfarin (COUMADIN) 4 MG tablet TAKE 1 TO 1 AND 1/2 TABLETS BY MOUTH DAILY AS DIRECTED BY COUMADIN CLINIC 45 tablet 3  . predniSONE (DELTASONE) 20 MG tablet Two daily with food (Patient not taking: Reported on 06/13/2015) 10 tablet 0   No current facility-administered medications on file prior to visit.    Review of Systems:  As per HPI- otherwise negative.   Physical Examination: Filed Vitals:   06/13/15 0941  BP: 105/68  Pulse: 89  Temp: 98 F (36.7 C)  Resp: 16   Filed Vitals:   06/13/15 0941  Height: 5' 3.5" (1.613 m)  Weight: 132 lb 12.8 oz (60.238 kg)   Body mass index is 23.15 kg/(m^2). Ideal Body  Weight: Weight in (lb) to have BMI = 25: 143.1  GEN: WDWN, NAD, Non-toxic, A & O x 3, looks well HEENT: Atraumatic, Normocephalic. Neck supple. No masses, No LAD. Ears and Nose: No external deformity. CV: RRR, No M/G/R. No JVD. No thrill. No extra heart sounds. PULM: CTA B, no wheezes, crackles, rhonchi. No retractions. No resp. distress. No accessory muscle use. ABD: S, NT, ND, +BS. No rebound. No HSM. EXTR: No c/c/e NEURO Normal gait.  PSYCH: Normally interactive. Conversant. Not depressed or anxious appearing.  Calm demeanor.    Assessment and Plan: Physical exam  Screening for diabetes mellitus - Plan: Comprehensive metabolic panel, Hemoglobin A1c  Screening for hyperlipidemia - Plan: Lipid panel  Screening for deficiency anemia - Plan: CBC  Insomnia - Plan: ALPRAZolam (XANAX) 0.25 MG tablet  CPE and labs as above Overall she is doing well trazaone interacts with her chronic coumadin, so will try a lose dose of intermittent xanax for her insomnia Also emphasized need for a good bedtime routine and sleep hygiene.   She will let me know if not improved  Signed Lamar Blinks, MD

## 2015-06-20 ENCOUNTER — Ambulatory Visit (INDEPENDENT_AMBULATORY_CARE_PROVIDER_SITE_OTHER): Payer: 59 | Admitting: Pharmacist Clinician (PhC)/ Clinical Pharmacy Specialist

## 2015-06-20 DIAGNOSIS — Z7901 Long term (current) use of anticoagulants: Secondary | ICD-10-CM

## 2015-06-20 DIAGNOSIS — Z954 Presence of other heart-valve replacement: Secondary | ICD-10-CM

## 2015-06-20 DIAGNOSIS — I359 Nonrheumatic aortic valve disorder, unspecified: Secondary | ICD-10-CM

## 2015-06-20 DIAGNOSIS — Z952 Presence of prosthetic heart valve: Secondary | ICD-10-CM

## 2015-06-20 LAB — POCT INR: INR: 4.7

## 2015-07-04 ENCOUNTER — Ambulatory Visit: Payer: 59 | Admitting: Pharmacist Clinician (PhC)/ Clinical Pharmacy Specialist

## 2015-07-11 ENCOUNTER — Ambulatory Visit (INDEPENDENT_AMBULATORY_CARE_PROVIDER_SITE_OTHER): Payer: 59 | Admitting: Pharmacist

## 2015-07-11 DIAGNOSIS — Z7901 Long term (current) use of anticoagulants: Secondary | ICD-10-CM

## 2015-07-11 DIAGNOSIS — I359 Nonrheumatic aortic valve disorder, unspecified: Secondary | ICD-10-CM

## 2015-07-11 DIAGNOSIS — Z952 Presence of prosthetic heart valve: Secondary | ICD-10-CM

## 2015-07-11 DIAGNOSIS — Z954 Presence of other heart-valve replacement: Secondary | ICD-10-CM

## 2015-07-11 LAB — POCT INR: INR: 4.8

## 2015-07-20 ENCOUNTER — Other Ambulatory Visit: Payer: Self-pay | Admitting: Gastroenterology

## 2015-07-20 DIAGNOSIS — B1910 Unspecified viral hepatitis B without hepatic coma: Secondary | ICD-10-CM

## 2015-07-25 ENCOUNTER — Ambulatory Visit: Payer: 59 | Admitting: Pharmacist Clinician (PhC)/ Clinical Pharmacy Specialist

## 2015-08-01 ENCOUNTER — Ambulatory Visit
Admission: RE | Admit: 2015-08-01 | Discharge: 2015-08-01 | Disposition: A | Discharge status: Home / Self Care | Payer: 59 | Source: Ambulatory Visit | Attending: Gastroenterology | Admitting: Gastroenterology

## 2015-08-01 DIAGNOSIS — B1910 Unspecified viral hepatitis B without hepatic coma: Secondary | ICD-10-CM

## 2015-08-08 ENCOUNTER — Ambulatory Visit (INDEPENDENT_AMBULATORY_CARE_PROVIDER_SITE_OTHER): Payer: 59 | Admitting: Pharmacist Clinician (PhC)/ Clinical Pharmacy Specialist

## 2015-08-08 DIAGNOSIS — Z954 Presence of other heart-valve replacement: Secondary | ICD-10-CM

## 2015-08-08 DIAGNOSIS — Z7901 Long term (current) use of anticoagulants: Secondary | ICD-10-CM | POA: Diagnosis not present

## 2015-08-08 DIAGNOSIS — Z952 Presence of prosthetic heart valve: Secondary | ICD-10-CM

## 2015-08-08 DIAGNOSIS — I359 Nonrheumatic aortic valve disorder, unspecified: Secondary | ICD-10-CM | POA: Diagnosis not present

## 2015-08-08 LAB — POCT INR: INR: 3.9

## 2015-08-22 ENCOUNTER — Encounter: Payer: 59 | Admitting: Pharmacist Clinician (PhC)/ Clinical Pharmacy Specialist

## 2015-09-12 ENCOUNTER — Ambulatory Visit: Payer: Self-pay | Admitting: Pharmacist Clinician (PhC)/ Clinical Pharmacy Specialist

## 2015-09-18 ENCOUNTER — Ambulatory Visit: Payer: Self-pay | Admitting: Pharmacist Clinician (PhC)/ Clinical Pharmacy Specialist

## 2015-09-19 ENCOUNTER — Ambulatory Visit (INDEPENDENT_AMBULATORY_CARE_PROVIDER_SITE_OTHER): Payer: BLUE CROSS/BLUE SHIELD | Admitting: Pharmacist Clinician (PhC)/ Clinical Pharmacy Specialist

## 2015-09-19 ENCOUNTER — Ambulatory Visit: Payer: Self-pay | Admitting: Pharmacist Clinician (PhC)/ Clinical Pharmacy Specialist

## 2015-09-19 DIAGNOSIS — I359 Nonrheumatic aortic valve disorder, unspecified: Secondary | ICD-10-CM

## 2015-09-19 DIAGNOSIS — Z954 Presence of other heart-valve replacement: Secondary | ICD-10-CM

## 2015-09-19 DIAGNOSIS — Z7901 Long term (current) use of anticoagulants: Secondary | ICD-10-CM

## 2015-09-19 DIAGNOSIS — Z952 Presence of prosthetic heart valve: Secondary | ICD-10-CM

## 2015-09-19 LAB — POCT INR: INR: 2.9

## 2015-09-21 ENCOUNTER — Encounter: Payer: Self-pay | Admitting: Family Medicine

## 2015-09-26 ENCOUNTER — Encounter: Payer: Self-pay | Admitting: Family Medicine

## 2015-10-17 ENCOUNTER — Ambulatory Visit (INDEPENDENT_AMBULATORY_CARE_PROVIDER_SITE_OTHER): Payer: BLUE CROSS/BLUE SHIELD | Admitting: Pharmacist Clinician (PhC)/ Clinical Pharmacy Specialist

## 2015-10-17 DIAGNOSIS — I359 Nonrheumatic aortic valve disorder, unspecified: Secondary | ICD-10-CM | POA: Diagnosis not present

## 2015-10-17 DIAGNOSIS — Z954 Presence of other heart-valve replacement: Secondary | ICD-10-CM | POA: Diagnosis not present

## 2015-10-17 DIAGNOSIS — Z7901 Long term (current) use of anticoagulants: Secondary | ICD-10-CM

## 2015-10-17 DIAGNOSIS — Z952 Presence of prosthetic heart valve: Secondary | ICD-10-CM

## 2015-10-17 LAB — POCT INR: INR: 2.3

## 2015-10-30 ENCOUNTER — Other Ambulatory Visit: Payer: Self-pay | Admitting: Cardiology

## 2015-11-16 ENCOUNTER — Encounter: Payer: BLUE CROSS/BLUE SHIELD | Admitting: Pharmacist Clinician (PhC)/ Clinical Pharmacy Specialist

## 2015-11-22 ENCOUNTER — Encounter: Payer: BLUE CROSS/BLUE SHIELD | Admitting: Pharmacist Clinician (PhC)/ Clinical Pharmacy Specialist

## 2015-11-27 ENCOUNTER — Encounter: Payer: BLUE CROSS/BLUE SHIELD | Admitting: Pharmacist Clinician (PhC)/ Clinical Pharmacy Specialist

## 2015-12-04 ENCOUNTER — Ambulatory Visit (INDEPENDENT_AMBULATORY_CARE_PROVIDER_SITE_OTHER): Payer: BLUE CROSS/BLUE SHIELD | Admitting: Pharmacist Clinician (PhC)/ Clinical Pharmacy Specialist

## 2015-12-04 DIAGNOSIS — Z954 Presence of other heart-valve replacement: Secondary | ICD-10-CM | POA: Diagnosis not present

## 2015-12-04 DIAGNOSIS — I359 Nonrheumatic aortic valve disorder, unspecified: Secondary | ICD-10-CM | POA: Diagnosis not present

## 2015-12-04 DIAGNOSIS — Z7901 Long term (current) use of anticoagulants: Secondary | ICD-10-CM

## 2015-12-04 DIAGNOSIS — Z952 Presence of prosthetic heart valve: Secondary | ICD-10-CM

## 2015-12-04 LAB — POCT INR: INR: 4.2

## 2015-12-05 DIAGNOSIS — N921 Excessive and frequent menstruation with irregular cycle: Secondary | ICD-10-CM | POA: Diagnosis not present

## 2015-12-05 DIAGNOSIS — Z124 Encounter for screening for malignant neoplasm of cervix: Secondary | ICD-10-CM | POA: Diagnosis not present

## 2015-12-05 DIAGNOSIS — Z01419 Encounter for gynecological examination (general) (routine) without abnormal findings: Secondary | ICD-10-CM | POA: Diagnosis not present

## 2015-12-11 ENCOUNTER — Telehealth: Payer: Self-pay | Admitting: Cardiology

## 2015-12-11 NOTE — Telephone Encounter (Signed)
Received records from [facility] for appointment on 12/26/15 with Dr Martinique.  Records given to [facility] (medical records) for Dr Doug Sou schedule on 12/26/15. lp

## 2015-12-12 DIAGNOSIS — Z1231 Encounter for screening mammogram for malignant neoplasm of breast: Secondary | ICD-10-CM | POA: Diagnosis not present

## 2015-12-19 DIAGNOSIS — R208 Other disturbances of skin sensation: Secondary | ICD-10-CM | POA: Diagnosis not present

## 2015-12-26 ENCOUNTER — Ambulatory Visit (INDEPENDENT_AMBULATORY_CARE_PROVIDER_SITE_OTHER): Payer: BLUE CROSS/BLUE SHIELD | Admitting: Cardiology

## 2015-12-26 ENCOUNTER — Encounter: Payer: Self-pay | Admitting: Cardiology

## 2015-12-26 ENCOUNTER — Ambulatory Visit (INDEPENDENT_AMBULATORY_CARE_PROVIDER_SITE_OTHER): Payer: BLUE CROSS/BLUE SHIELD | Admitting: Pharmacist Clinician (PhC)/ Clinical Pharmacy Specialist

## 2015-12-26 VITALS — BP 103/72 | HR 96 | Ht 65.0 in | Wt 131.0 lb

## 2015-12-26 DIAGNOSIS — Z952 Presence of prosthetic heart valve: Secondary | ICD-10-CM

## 2015-12-26 DIAGNOSIS — I359 Nonrheumatic aortic valve disorder, unspecified: Secondary | ICD-10-CM

## 2015-12-26 DIAGNOSIS — Z7901 Long term (current) use of anticoagulants: Secondary | ICD-10-CM

## 2015-12-26 DIAGNOSIS — Z954 Presence of other heart-valve replacement: Secondary | ICD-10-CM

## 2015-12-26 LAB — POCT INR: INR: 2.8

## 2015-12-26 NOTE — Patient Instructions (Signed)
Stop taking Mobic  Continue your other therapy  I will see you in 6 months.

## 2015-12-26 NOTE — Progress Notes (Signed)
[name] Date of Birth: 17-Jun-1971 Medical Record [id number]  History of Present Illness: [name] is seen today for followup. [name] has a history of severe aortic insufficiency and underwent aortic valve replacement with a mechanical prosthesis in April of 2006. This was a #21 mm On-X valve. [name] does have some chronic mild dyspnea. [name]'s had extensive pulmonary and cardiac evaluation for this which has been unremarkable. [name] is on chronic anticoagulation. [name] does have a history of hepatitis B. On follow up today [name] is doing well. Still notes the same level of shortness of breath. [name] is walking daily. Has lost 2 lbs. Seen by ortho for carpel tunnel syndrome and was placed on Mobic. [name] is able to hear her valve click.  Current Outpatient Prescriptions on File Prior to Visit  Medication Sig Dispense Refill  . ALPRAZolam (XANAX) 0.25 MG tablet Take a 1/2 or whole tablet as needed for insomnia.  Use only when needed 30 tablet 0  . entecavir (BARACLUDE) 1 MG tablet Take 1 mg by mouth daily.    . Multiple Vitamins-Minerals (ONE DAILY MULTIVITAMIN WOMEN PO) Take 1 tablet by mouth as needed.     . warfarin (COUMADIN) 4 MG tablet TAKE 1 TO 1 AND 1/2 TABLETS BY MOUTH DAILY AS DIRECTED BY COUMADIN CLINIC 45 tablet 3   No current facility-administered medications on file prior to visit.    No Known Allergies  Past Medical History  Diagnosis Date  . Severe aortic insufficiency 09/01/2004    s/p aortic valve replacement.    . Femur fracture, left ([place]) 1990    with open reduction and internal fixation   . H/O: rheumatic fever   . History of allergic rhinitis   . Hepatitis B carrier     positive hepatitis B carrier; maintained on Baraclude; followed by Hepatitis clinic.    Past Surgical History  Procedure Laterality Date  . Aortic valve replacement  4/06    40mm On-X mechanical valve  . Hemorrhoid surgery    . Cardiac catheterization  11/15/2004    Ejection fraction is estimated 55%     History  Smoking status  . Never Smoker   Smokeless tobacco  . Not on file    History  Alcohol Use No    Family History  Problem Relation Age of Onset  . Heart disease Brother     valvular dysfunction; s/p replacement  . Hypertension Mother   . Hyperlipidemia Mother   . Hypothyroidism Mother   . Heart disease Father   . Hypertension Father   . Hyperlipidemia Father     Review of Systems: As noted in history of present illness.  All other systems were reviewed and are negative.  Physical Exam: BP 103/72 mmHg  Pulse 96  Ht 5\' 5"  (1.651 m)  Wt 59.421 kg (131 lb)  BMI 21.80 kg/m2 [name] is a pleasant, Asian female in no acute distress. HEENT exam is unremarkable. [name] is no JVD or bruits. Lungs are clear. Cardiac exam reveals a regular rate and rhythm without gallop. [name] has a good mechanical aortic valve click. Abdomen is soft and nontender. [name] has no significant edema. Skin is warm and dry. [name] is alert and oriented x3. Cranial nerves II through XII are intact.  LABORATORY DATA:   INR today is pending.  Echo 12/12/14:Study Conclusions  - Left ventricle: The cavity size was normal. Wall thickness was normal. Systolic function was normal. The estimated ejection fraction was in the range of 55% to  60%. Left ventricular diastolic function parameters were normal. - Aortic valve: There was a mechanical aortic valve. No significant stenosis. There was trivial regurgitation. Mean gradient (S): 11 mm Hg. - Mitral valve: There was mild regurgitation. - Right ventricle: The cavity size was normal. Systolic function was normal. - Tricuspid valve: Peak RV-RA gradient (S): 22 mm Hg. - Pulmonary arteries: PA peak pressure: 25 mm Hg (S). - Inferior vena cava: The vessel was normal in size. The respirophasic diameter changes were in the normal range (= 50%), consistent with normal central venous pressure.  Impressions:  - Normal LV size with EF 55-60%. Normal  diastolic function. Normally functioning mechanical aortic valve. Mild MR. Normal RV size and systolic function.  Ecg today shows NSR with rate 96. Nonspecific TWA. I have personally reviewed and interpreted this study.   Assessment / Plan: 1. Status post mechanical aortic valve replacement for severe aortic insufficiency. Exam is stable. Echo in April 2016 shoed good valve function. Continue anticoagulation and routine SBE prophylaxis.  Follow up in 6 months.  2.  Hepatitis B.  3. Carpel tunnel syndrome. I recommend [name] not take NSAIDs due to risk of bleeding on coumadin.

## 2016-01-07 ENCOUNTER — Other Ambulatory Visit: Payer: Self-pay | Admitting: Obstetrics and Gynecology

## 2016-01-07 DIAGNOSIS — N840 Polyp of corpus uteri: Secondary | ICD-10-CM | POA: Diagnosis not present

## 2016-01-07 DIAGNOSIS — N921 Excessive and frequent menstruation with irregular cycle: Secondary | ICD-10-CM | POA: Diagnosis not present

## 2016-01-09 DIAGNOSIS — R208 Other disturbances of skin sensation: Secondary | ICD-10-CM | POA: Diagnosis not present

## 2016-01-23 ENCOUNTER — Encounter: Payer: BLUE CROSS/BLUE SHIELD | Admitting: Pharmacist Clinician (PhC)/ Clinical Pharmacy Specialist

## 2016-01-30 ENCOUNTER — Ambulatory Visit (INDEPENDENT_AMBULATORY_CARE_PROVIDER_SITE_OTHER): Payer: BLUE CROSS/BLUE SHIELD | Admitting: Pharmacist

## 2016-01-30 DIAGNOSIS — Z952 Presence of prosthetic heart valve: Secondary | ICD-10-CM

## 2016-01-30 DIAGNOSIS — I359 Nonrheumatic aortic valve disorder, unspecified: Secondary | ICD-10-CM

## 2016-01-30 DIAGNOSIS — Z954 Presence of other heart-valve replacement: Secondary | ICD-10-CM | POA: Diagnosis not present

## 2016-01-30 DIAGNOSIS — Z7901 Long term (current) use of anticoagulants: Secondary | ICD-10-CM | POA: Diagnosis not present

## 2016-01-30 LAB — POCT INR: INR: 3

## 2016-02-24 DIAGNOSIS — R05 Cough: Secondary | ICD-10-CM | POA: Diagnosis not present

## 2016-02-24 DIAGNOSIS — J019 Acute sinusitis, unspecified: Secondary | ICD-10-CM | POA: Diagnosis not present

## 2016-03-05 ENCOUNTER — Ambulatory Visit (INDEPENDENT_AMBULATORY_CARE_PROVIDER_SITE_OTHER): Payer: BLUE CROSS/BLUE SHIELD | Admitting: Pharmacist

## 2016-03-05 DIAGNOSIS — Z7901 Long term (current) use of anticoagulants: Secondary | ICD-10-CM | POA: Diagnosis not present

## 2016-03-05 DIAGNOSIS — I359 Nonrheumatic aortic valve disorder, unspecified: Secondary | ICD-10-CM | POA: Diagnosis not present

## 2016-03-05 DIAGNOSIS — Z954 Presence of other heart-valve replacement: Secondary | ICD-10-CM

## 2016-03-05 DIAGNOSIS — Z952 Presence of prosthetic heart valve: Secondary | ICD-10-CM

## 2016-03-05 LAB — POCT INR: INR: 4

## 2016-03-12 DIAGNOSIS — G5602 Carpal tunnel syndrome, left upper limb: Secondary | ICD-10-CM | POA: Diagnosis not present

## 2016-03-14 DIAGNOSIS — R42 Dizziness and giddiness: Secondary | ICD-10-CM | POA: Diagnosis not present

## 2016-03-14 DIAGNOSIS — J42 Unspecified chronic bronchitis: Secondary | ICD-10-CM | POA: Diagnosis not present

## 2016-03-26 ENCOUNTER — Ambulatory Visit (INDEPENDENT_AMBULATORY_CARE_PROVIDER_SITE_OTHER): Payer: BLUE CROSS/BLUE SHIELD | Admitting: Pharmacist Clinician (PhC)/ Clinical Pharmacy Specialist

## 2016-03-26 DIAGNOSIS — I359 Nonrheumatic aortic valve disorder, unspecified: Secondary | ICD-10-CM | POA: Diagnosis not present

## 2016-03-26 DIAGNOSIS — Z7901 Long term (current) use of anticoagulants: Secondary | ICD-10-CM | POA: Diagnosis not present

## 2016-03-26 DIAGNOSIS — Z952 Presence of prosthetic heart valve: Secondary | ICD-10-CM

## 2016-03-26 DIAGNOSIS — Z954 Presence of other heart-valve replacement: Secondary | ICD-10-CM

## 2016-03-26 LAB — POCT INR: INR: 3.6

## 2016-04-14 ENCOUNTER — Other Ambulatory Visit: Payer: Self-pay | Admitting: Nurse Practitioner

## 2016-04-14 ENCOUNTER — Other Ambulatory Visit: Payer: Self-pay | Admitting: Cardiology

## 2016-04-14 DIAGNOSIS — B169 Acute hepatitis B without delta-agent and without hepatic coma: Secondary | ICD-10-CM | POA: Diagnosis not present

## 2016-04-14 DIAGNOSIS — B1910 Unspecified viral hepatitis B without hepatic coma: Secondary | ICD-10-CM

## 2016-04-14 DIAGNOSIS — B181 Chronic viral hepatitis B without delta-agent: Secondary | ICD-10-CM | POA: Diagnosis not present

## 2016-04-15 ENCOUNTER — Telehealth: Payer: Self-pay | Admitting: Pharmacist

## 2016-04-15 NOTE — Telephone Encounter (Signed)
Pt called stating that blood was checked at primary care yesterday. They called her to tell her to make an appt with our clinic because her INR was 5.1.   Instructed her to hold today and tomorrow's dose and keep apt made for Thurs 04/17/16. She stated she understood and will come for appt on Thursday.

## 2016-04-16 DIAGNOSIS — G5602 Carpal tunnel syndrome, left upper limb: Secondary | ICD-10-CM | POA: Diagnosis not present

## 2016-04-17 ENCOUNTER — Ambulatory Visit (INDEPENDENT_AMBULATORY_CARE_PROVIDER_SITE_OTHER): Payer: BLUE CROSS/BLUE SHIELD | Admitting: Pharmacist

## 2016-04-17 DIAGNOSIS — Z954 Presence of other heart-valve replacement: Secondary | ICD-10-CM | POA: Diagnosis not present

## 2016-04-17 DIAGNOSIS — I359 Nonrheumatic aortic valve disorder, unspecified: Secondary | ICD-10-CM

## 2016-04-17 DIAGNOSIS — Z7901 Long term (current) use of anticoagulants: Secondary | ICD-10-CM | POA: Diagnosis not present

## 2016-04-17 DIAGNOSIS — Z952 Presence of prosthetic heart valve: Secondary | ICD-10-CM

## 2016-04-17 LAB — POCT INR: INR: 1.2

## 2016-04-23 ENCOUNTER — Ambulatory Visit
Admission: RE | Admit: 2016-04-23 | Discharge: 2016-04-23 | Disposition: A | Discharge status: Home / Self Care | Payer: BLUE CROSS/BLUE SHIELD | Source: Ambulatory Visit | Attending: Nurse Practitioner | Admitting: Nurse Practitioner

## 2016-04-23 DIAGNOSIS — H6993 Unspecified Eustachian tube disorder, bilateral: Secondary | ICD-10-CM | POA: Diagnosis not present

## 2016-04-23 DIAGNOSIS — B1910 Unspecified viral hepatitis B without hepatic coma: Secondary | ICD-10-CM

## 2016-04-23 DIAGNOSIS — J310 Chronic rhinitis: Secondary | ICD-10-CM | POA: Diagnosis not present

## 2016-04-23 DIAGNOSIS — B1920 Unspecified viral hepatitis C without hepatic coma: Secondary | ICD-10-CM | POA: Diagnosis not present

## 2016-05-07 ENCOUNTER — Ambulatory Visit (INDEPENDENT_AMBULATORY_CARE_PROVIDER_SITE_OTHER): Payer: BLUE CROSS/BLUE SHIELD | Admitting: Pharmacist

## 2016-05-07 DIAGNOSIS — Z7901 Long term (current) use of anticoagulants: Secondary | ICD-10-CM

## 2016-05-07 DIAGNOSIS — Z954 Presence of other heart-valve replacement: Secondary | ICD-10-CM

## 2016-05-07 DIAGNOSIS — Z952 Presence of prosthetic heart valve: Secondary | ICD-10-CM

## 2016-05-07 DIAGNOSIS — I359 Nonrheumatic aortic valve disorder, unspecified: Secondary | ICD-10-CM | POA: Diagnosis not present

## 2016-05-07 LAB — POCT INR: INR: 4.7

## 2016-05-14 DIAGNOSIS — Z23 Encounter for immunization: Secondary | ICD-10-CM | POA: Diagnosis not present

## 2016-05-17 ENCOUNTER — Encounter (HOSPITAL_COMMUNITY): Payer: Self-pay

## 2016-05-17 ENCOUNTER — Emergency Department (HOSPITAL_COMMUNITY)
Admission: EM | Admit: 2016-05-17 | Discharge: 2016-05-17 | Disposition: A | Discharge status: Home / Self Care | Payer: BLUE CROSS/BLUE SHIELD | Attending: Emergency Medicine | Admitting: Emergency Medicine

## 2016-05-17 ENCOUNTER — Emergency Department (HOSPITAL_COMMUNITY): Payer: BLUE CROSS/BLUE SHIELD

## 2016-05-17 DIAGNOSIS — R0789 Other chest pain: Secondary | ICD-10-CM | POA: Diagnosis not present

## 2016-05-17 DIAGNOSIS — R0602 Shortness of breath: Secondary | ICD-10-CM | POA: Diagnosis not present

## 2016-05-17 DIAGNOSIS — Z7901 Long term (current) use of anticoagulants: Secondary | ICD-10-CM | POA: Insufficient documentation

## 2016-05-17 DIAGNOSIS — M940 Chondrocostal junction syndrome [Tietze]: Secondary | ICD-10-CM

## 2016-05-17 DIAGNOSIS — R079 Chest pain, unspecified: Secondary | ICD-10-CM

## 2016-05-17 DIAGNOSIS — Z79899 Other long term (current) drug therapy: Secondary | ICD-10-CM | POA: Diagnosis not present

## 2016-05-17 LAB — I-STAT TROPONIN, ED: TROPONIN I, POC: 0 ng/mL (ref 0.00–0.08)

## 2016-05-17 LAB — CBC
HCT: 38.7 % (ref 36.0–46.0)
HEMOGLOBIN: 12.5 g/dL (ref 12.0–15.0)
MCH: 26.9 pg (ref 26.0–34.0)
MCHC: 32.3 g/dL (ref 30.0–36.0)
MCV: 83.2 fL (ref 78.0–100.0)
Platelets: 258 10*3/uL (ref 150–400)
RBC: 4.65 MIL/uL (ref 3.87–5.11)
RDW: 14.5 % (ref 11.5–15.5)
WBC: 4.9 10*3/uL (ref 4.0–10.5)

## 2016-05-17 LAB — BASIC METABOLIC PANEL
ANION GAP: 5 (ref 5–15)
BUN: 14 mg/dL (ref 6–20)
CHLORIDE: 110 mmol/L (ref 101–111)
CO2: 24 mmol/L (ref 22–32)
Calcium: 9.2 mg/dL (ref 8.9–10.3)
Creatinine, Ser: 0.46 mg/dL (ref 0.44–1.00)
GFR calc non Af Amer: >60 mL/min (ref >60)
Glucose, Bld: 111 mg/dL — ABNORMAL HIGH (ref 65–99)
POTASSIUM: 4.3 mmol/L (ref 3.5–5.1)
SODIUM: 139 mmol/L (ref 135–145)

## 2016-05-17 LAB — I-STAT BETA HCG BLOOD, ED (MC, WL, AP ONLY)

## 2016-05-17 MED ORDER — TRAMADOL HCL 50 MG PO TABS: 50.0000 mg | ORAL_TABLET | Freq: Four times a day (QID) | ORAL | 0 refills | Status: DC | PRN

## 2016-05-17 NOTE — ED Provider Notes (Signed)
[facility] DEPT Provider Note   CSN: [id number] Arrival date & time: 05/17/16  1521     History   Chief Complaint Chief Complaint  Patient presents with  . Chest Pain    HPI [name] is a 45 y.o. female. She reports 2 days of left-sided sharp localized chest pain. Denies any other symptoms. States she felt like she might of been short of breath but relates it simply just the pain. It is intermittent. It is somewhat worse with breathing and movement. Has history of aortic valve replacement. Is anticoagulated for this. No history of known coronary artery disease. No history DVT PE. No URI symptoms no fever.  HPI  Past Medical History:  Diagnosis Date  . Femur fracture, left ([place]) 1990   with open reduction and internal fixation   . H/O: rheumatic fever   . Hepatitis B carrier    positive hepatitis B carrier; maintained on Baraclude; followed by Hepatitis clinic.  Marland Kitchen History of allergic rhinitis   . Severe aortic insufficiency 09/01/2004   s/p aortic valve replacement.      Patient Active Problem List   Diagnosis Date Noted  . Vasovagal syncope 05/02/2015  . Hepatitis B 04/28/2012  . Aortic valve disorder 02/11/2012  . Long term (current) use of anticoagulants 02/11/2012  . S/P AVR 12/16/2011  . Chronic anticoagulation 12/16/2011  . PULMONARY NODULE 01/31/2010  . DYSPNEA 01/31/2010    Past Surgical History:  Procedure Laterality Date  . AORTIC VALVE REPLACEMENT  4/06   60mm On-X mechanical valve  . CARDIAC CATHETERIZATION  11/15/2004   Ejection fraction is estimated 55%  . HEMORRHOID SURGERY      OB History    No data available       Home Medications    Prior to Admission medications   Medication Sig Start Date End Date Taking? Authorizing Provider  ALPRAZolam Duanne Moron) 0.25 MG tablet Take a 1/2 or whole tablet as needed for insomnia.  Use only when needed 06/13/15   Gay Filler Copland, MD  entecavir (BARACLUDE) 1 MG tablet Take 1 mg by mouth daily.     Historical Provider, MD  Multiple Vitamins-Minerals (ONE DAILY MULTIVITAMIN WOMEN PO) Take 1 tablet by mouth as needed.     Historical Provider, MD  traMADol (ULTRAM) 50 MG tablet Take 1 tablet (50 mg total) by mouth every 6 (six) hours as needed. 05/17/16   Tanna Furry, MD  warfarin (COUMADIN) 4 MG tablet TAKE 1 TO 1 AND 1/2 TABLETS BY MOUTH DAILY AS DIRECTED BY COUMADIN CLINIC 04/15/16   Peter M Martinique, MD    Family History Family History  Problem Relation Age of Onset  . Heart disease Brother     valvular dysfunction; s/p replacement  . Hypertension Mother   . Hyperlipidemia Mother   . Hypothyroidism Mother   . Heart disease Father   . Hypertension Father   . Hyperlipidemia Father     Social History Social History  Substance Use Topics  . Smoking status: Never Smoker  . Smokeless tobacco: Never Used  . Alcohol use No     Allergies   Review of patient's allergies indicates no known allergies.   Review of Systems Review of Systems  Constitutional: Negative for appetite change, chills, diaphoresis, fatigue and fever.  HENT: Negative for mouth sores, sore throat and trouble swallowing.   Eyes: Negative for visual disturbance.  Respiratory: Negative for cough, chest tightness, shortness of breath and wheezing.   Cardiovascular: Positive for chest pain.  Gastrointestinal: Negative for abdominal distention, abdominal pain, diarrhea, nausea and vomiting.  Endocrine: Negative for polydipsia, polyphagia and polyuria.  Genitourinary: Negative for dysuria, frequency and hematuria.  Musculoskeletal: Negative for gait problem.  Skin: Negative for color change, pallor and rash.  Neurological: Negative for dizziness, syncope, light-headedness and headaches.  Hematological: Does not bruise/bleed easily.  Psychiatric/Behavioral: Negative for behavioral problems and confusion.     Physical Exam Updated Vital Signs BP 99/68 (BP Location: Right Arm)   Pulse 85   Temp 97.6 F (36.4  C) (Oral)   Resp 15   Ht 5\' 5"  (1.651 m)   Wt 127 lb (57.6 kg)   LMP 05/03/2016   SpO2 100%   BMI 21.13 kg/m   Physical Exam  Constitutional: She is oriented to person, place, and time. She appears well-developed and well-nourished. No distress.  HENT:  Head: Normocephalic.  Eyes: Conjunctivae are normal. Pupils are equal, round, and reactive to light. No scleral icterus.  Neck: Normal range of motion. Neck supple. No thyromegaly present.  Cardiovascular: Normal rate and regular rhythm.  Exam reveals no gallop and no friction rub.   No murmur heard.   Pulmonary/Chest: Effort normal and breath sounds normal. No respiratory distress. She has no wheezes. She has no rales.  Abdominal: Soft. Bowel sounds are normal. She exhibits no distension. There is no tenderness. There is no rebound.  Musculoskeletal: Normal range of motion.  Neurological: She is alert and oriented to person, place, and time.  Skin: Skin is warm and dry. No rash noted.  Psychiatric: She has a normal mood and affect. Her behavior is normal.     ED Treatments / Results  Labs (all labs ordered are listed, but only abnormal results are displayed) Labs Reviewed  BASIC METABOLIC PANEL - Abnormal; Notable for the following:       Result Value   Glucose, Bld 111 (*)    All other components within normal limits  CBC  I-STAT TROPOININ, ED  I-STAT BETA HCG BLOOD, ED ([facility], [facility], [facility] ONLY)    EKG  EKG Interpretation None       Radiology Dg Chest 2 View  Result Date: 05/17/2016 CLINICAL DATA:  Patient is with chest pain and shortness of breath. EXAM: CHEST  2 VIEW COMPARISON:  Chest radiograph 12/20/2009; CT 01/18/2010 FINDINGS: Stable cardiac and mediastinal contours status post median sternotomy. No consolidative pulmonary opacities. No pleural effusion or pneumothorax. Nipple shadows project over the lung bases bilaterally. Thoracic spine degenerative changes. IMPRESSION: No active cardiopulmonary disease.  Electronically Signed   By: Lovey Newcomer M.D.   On: 05/17/2016 16:32    Procedures Procedures (including critical care time)  Medications Ordered in ED Medications - No data to display   Initial Impression / Assessment and Plan / ED Course  I have reviewed the triage vital signs and the nursing notes.  Pertinent labs & imaging results that were available during my care of the patient were reviewed by me and considered in my medical decision making (see chart for details).  Clinical Course   Chest x-ray shows no acute abnormalities. EKG without changes. No ectopy. No acute ischemic changes. She is adequately. Concerned about PE. Pain is reproducible chest wall. Likely costochondritis. I think she is appropriate for discharge without further studies. Troponin normal after 24 hours of symptoms. FOR pain. Primary care follow-up. ER with changes.   Final Clinical Impressions(s) / ED Diagnoses   Final diagnoses:  Chest pain, unspecified chest pain type  Costochondritis  New Prescriptions New Prescriptions   TRAMADOL (ULTRAM) 50 MG TABLET    Take 1 tablet (50 mg total) by mouth every 6 (six) hours as needed.     Tanna Furry, MD 05/17/16 2013

## 2016-05-17 NOTE — ED Triage Notes (Signed)
Patient complains of left anterior chest pain x 2 days. No associated symptoms with same. States that her pain is worse with inspiration but denies cold and cough symptoms

## 2016-05-17 NOTE — Discharge Instructions (Signed)
Follow up with your physician this week if symptoms not improving.

## 2016-05-21 ENCOUNTER — Ambulatory Visit (INDEPENDENT_AMBULATORY_CARE_PROVIDER_SITE_OTHER): Payer: BLUE CROSS/BLUE SHIELD | Admitting: Pharmacist Clinician (PhC)/ Clinical Pharmacy Specialist

## 2016-05-21 DIAGNOSIS — Z7901 Long term (current) use of anticoagulants: Secondary | ICD-10-CM

## 2016-05-21 DIAGNOSIS — Z954 Presence of other heart-valve replacement: Secondary | ICD-10-CM

## 2016-05-21 DIAGNOSIS — Z952 Presence of prosthetic heart valve: Secondary | ICD-10-CM

## 2016-05-21 DIAGNOSIS — I359 Nonrheumatic aortic valve disorder, unspecified: Secondary | ICD-10-CM

## 2016-05-21 LAB — POCT INR: INR: 5.1

## 2016-06-09 DIAGNOSIS — M79605 Pain in left leg: Secondary | ICD-10-CM | POA: Diagnosis not present

## 2016-06-13 ENCOUNTER — Other Ambulatory Visit: Payer: Self-pay | Admitting: Pharmacist Clinician (PhC)/ Clinical Pharmacy Specialist

## 2016-06-13 MED ORDER — WARFARIN SODIUM 4 MG PO TABS: ORAL_TABLET | ORAL | 3 refills | Status: DC

## 2016-06-25 ENCOUNTER — Other Ambulatory Visit: Payer: Self-pay | Admitting: Physician Assistant

## 2016-06-25 ENCOUNTER — Ambulatory Visit (INDEPENDENT_AMBULATORY_CARE_PROVIDER_SITE_OTHER): Payer: BLUE CROSS/BLUE SHIELD | Admitting: Pharmacist

## 2016-06-25 ENCOUNTER — Ambulatory Visit (INDEPENDENT_AMBULATORY_CARE_PROVIDER_SITE_OTHER): Payer: BLUE CROSS/BLUE SHIELD | Admitting: Physician Assistant

## 2016-06-25 VITALS — BP 122/72 | HR 83 | Temp 98.5°F | Resp 17 | Ht 63.5 in | Wt 127.0 lb

## 2016-06-25 DIAGNOSIS — G4700 Insomnia, unspecified: Secondary | ICD-10-CM

## 2016-06-25 DIAGNOSIS — Z13220 Encounter for screening for lipoid disorders: Secondary | ICD-10-CM

## 2016-06-25 DIAGNOSIS — Z7901 Long term (current) use of anticoagulants: Secondary | ICD-10-CM

## 2016-06-25 DIAGNOSIS — R0981 Nasal congestion: Secondary | ICD-10-CM | POA: Diagnosis not present

## 2016-06-25 DIAGNOSIS — Z1321 Encounter for screening for nutritional disorder: Secondary | ICD-10-CM

## 2016-06-25 DIAGNOSIS — Z13228 Encounter for screening for other metabolic disorders: Secondary | ICD-10-CM

## 2016-06-25 DIAGNOSIS — Z952 Presence of prosthetic heart valve: Secondary | ICD-10-CM | POA: Diagnosis not present

## 2016-06-25 DIAGNOSIS — Z1329 Encounter for screening for other suspected endocrine disorder: Secondary | ICD-10-CM | POA: Diagnosis not present

## 2016-06-25 DIAGNOSIS — Z130 Encounter for screening for diseases of the blood and blood-forming organs and certain disorders involving the immune mechanism: Secondary | ICD-10-CM

## 2016-06-25 DIAGNOSIS — I359 Nonrheumatic aortic valve disorder, unspecified: Secondary | ICD-10-CM | POA: Diagnosis not present

## 2016-06-25 DIAGNOSIS — Z0000 Encounter for general adult medical examination without abnormal findings: Secondary | ICD-10-CM | POA: Diagnosis not present

## 2016-06-25 DIAGNOSIS — R946 Abnormal results of thyroid function studies: Secondary | ICD-10-CM | POA: Diagnosis not present

## 2016-06-25 DIAGNOSIS — R5383 Other fatigue: Secondary | ICD-10-CM | POA: Diagnosis not present

## 2016-06-25 DIAGNOSIS — Z1389 Encounter for screening for other disorder: Secondary | ICD-10-CM | POA: Diagnosis not present

## 2016-06-25 LAB — COMPLETE METABOLIC PANEL WITH GFR
ALT: 24 U/L (ref 6–29)
AST: 26 U/L (ref 10–30)
Albumin: 3.6 g/dL (ref 3.6–5.1)
Alkaline Phosphatase: 70 U/L (ref 33–115)
BUN: 14 mg/dL (ref 7–25)
CALCIUM: 9 mg/dL (ref 8.6–10.2)
CHLORIDE: 107 mmol/L (ref 98–110)
CO2: 23 mmol/L (ref 20–31)
CREATININE: 0.4 mg/dL — AB (ref 0.50–1.10)
GFR, Est African American: >89 mL/min (ref >=60)
GFR, Est Non African American: >89 mL/min (ref >=60)
Glucose, Bld: 87 mg/dL (ref 65–99)
POTASSIUM: 4.2 mmol/L (ref 3.5–5.3)
SODIUM: 136 mmol/L (ref 135–146)
Total Bilirubin: 1.1 mg/dL (ref 0.2–1.2)
Total Protein: 7 g/dL (ref 6.1–8.1)

## 2016-06-25 LAB — CBC WITH DIFFERENTIAL/PLATELET
BASOS ABS: 0 {cells}/uL (ref 0–200)
Basophils Relative: 0 %
EOS ABS: 104 {cells}/uL (ref 15–500)
Eosinophils Relative: 2 %
HEMATOCRIT: 40.3 % (ref 35.0–45.0)
HEMOGLOBIN: 13.3 g/dL (ref 11.7–15.5)
LYMPHS ABS: 1612 {cells}/uL (ref 850–3900)
LYMPHS PCT: 31 %
MCH: 27 pg (ref 27.0–33.0)
MCHC: 33 g/dL (ref 32.0–36.0)
MCV: 81.7 fL (ref 80.0–100.0)
MONO ABS: 624 {cells}/uL (ref 200–950)
MPV: 9.4 fL (ref 7.5–12.5)
Monocytes Relative: 12 %
NEUTROS PCT: 55 %
Neutro Abs: 2860 cells/uL (ref 1500–7800)
Platelets: 273 10*3/uL (ref 140–400)
RBC: 4.93 MIL/uL (ref 3.80–5.10)
RDW: 14.7 % (ref 11.0–15.0)
WBC: 5.2 10*3/uL (ref 3.8–10.8)

## 2016-06-25 LAB — POCT INR: INR: 3

## 2016-06-25 LAB — POC MICROSCOPIC URINALYSIS (UMFC): Mucus: ABSENT

## 2016-06-25 LAB — POCT URINALYSIS DIP (MANUAL ENTRY)
BILIRUBIN UA: NEGATIVE
Glucose, UA: NEGATIVE
Ketones, POC UA: NEGATIVE
NITRITE UA: NEGATIVE
PH UA: 5
PROTEIN UA: NEGATIVE
Spec Grav, UA: 1.015
Urobilinogen, UA: 0.2

## 2016-06-25 LAB — LIPID PANEL
Cholesterol: 125 mg/dL (ref 125–200)
HDL: 49 mg/dL (ref >=46)
LDL CALC: 66 mg/dL (ref <130)
TRIGLYCERIDES: 51 mg/dL (ref <150)
Total CHOL/HDL Ratio: 2.6 Ratio (ref <=5.0)
VLDL: 10 mg/dL (ref <30)

## 2016-06-25 LAB — TSH: TSH: 0.01 m[IU]/L — AB

## 2016-06-25 MED ORDER — FLUTICASONE PROPIONATE 50 MCG/ACT NA SUSP: 2.0000 | Freq: Every day | NASAL | 6 refills | Status: DC

## 2016-06-25 NOTE — Progress Notes (Signed)
[name]  MRN: [id number] DOB: 1970-12-16  Subjective:  Pt is a 45 y.o. female who presents for annual physical exam.   Social: Pt is from [place], she has lived her over 20 years. She does nails for a living. She is married and has three boys. She is sexually active with monogamous partner.   Diet: She has a well balanced diet. She eats chicken and really likes vegetables but has to limit her leafy greens due to coumadin use. She eats rice daily for two meals. She drinks water and milk daily.  Pt notes when she has been drinking 2% milk lately she has been getting cramping and diarrhea.   Exercise: She walks 30-35 minutes every day. She bikes on stationary bike intermittently.  Sleep: Pt has trouble getting to sleep but stays asleep for the entire night. She notes she sometimes watches tv right before bed. She has tried relaxing methods, yoga, melatonin, xanax, and benedryl. She does not want to use medications anymore for this.   Of note, pt is on chronic warfarin for aortic valve disorder. She is followed closely by Dr. Peter Martinique. She has an appointment today for INR check. She also is treated for hepatitis B with entecavir and is followed by Dr. Arrie Aran in [place].    Pt has intermittent sneezing and sinus pressure, she has seen an ENT for this in the past and was given brand name flonase, which has helped her. She would like another refill for this.   Pt is fasting today.   Last dental exam: 06/24/16 Last vision exam: 2015 Last pap smear: 06/2016, Followed by [facility] Last mammogram: 10/2015  Vaccinations      Tetanus: 08/02/2007      Influenza: 05/2016        Patient Active Problem List   Diagnosis Date Noted  . Vasovagal syncope 05/02/2015  . Hepatitis B 04/28/2012  . Aortic valve disorder 02/11/2012  . Long term (current) use of anticoagulants 02/11/2012  . S/P AVR 12/16/2011  . Chronic anticoagulation 12/16/2011  . PULMONARY NODULE 01/31/2010  .  DYSPNEA 01/31/2010    Current Outpatient Prescriptions on File Prior to Visit  Medication Sig Dispense Refill  . entecavir (BARACLUDE) 1 MG tablet Take 1 mg by mouth daily.    . Multiple Vitamins-Minerals (ONE DAILY MULTIVITAMIN WOMEN PO) Take 1 tablet by mouth as needed.     . warfarin (COUMADIN) 4 MG tablet Take 1 tablet by mouth daily or as directed by coumadin clinic 30 tablet 3   No current facility-administered medications on file prior to visit.     No Known Allergies  Social History   Social History  . Marital status: Married    Spouse name: N/A  . Number of children: 3  . Years of education: N/A   Occupational History  . manicure [employer]   Social History Main Topics  . Smoking status: Never Smoker  . Smokeless tobacco: Never Used  . Alcohol use No  . Drug use: No  . Sexual activity: Yes    Partners: Male   Other Topics Concern  . None   Social History Narrative   Marital status: married x 18 years; happily married; no abuse.  From [place]; [place] since 1993.      Children: 3 boys (17, 16, 15)      Lives: with husband, 3 sons      Employment:  Designer, multimedia at [employer].      Tobacco: none  Alcohol: none      Drugs: none      Exercise: walking x 3-4 days; 30 minutes    Past Surgical History:  Procedure Laterality Date  . AORTIC VALVE REPLACEMENT  4/06   56mm On-X mechanical valve  . CARDIAC CATHETERIZATION  11/15/2004   Ejection fraction is estimated 55%  . HEMORRHOID SURGERY      Family History  Problem Relation Age of Onset  . Heart disease Brother     valvular dysfunction; s/p replacement  . Hypertension Mother   . Hyperlipidemia Mother   . Hypothyroidism Mother   . Heart disease Father   . Hypertension Father   . Hyperlipidemia Father     Review of Systems  Constitutional: Negative.   HENT: Positive for congestion, sinus pressure (intermittent, not present today) and sneezing. Negative for dental problem, drooling, ear discharge, ear  pain, facial swelling, hearing loss, mouth sores, nosebleeds, postnasal drip, rhinorrhea, sore throat, tinnitus, trouble swallowing and voice change.   Eyes: Negative.   Respiratory: Negative.   Cardiovascular: Negative.   Gastrointestinal: Negative.   Endocrine: Negative.   Genitourinary: Negative.   Musculoskeletal: Negative.   Skin: Negative.   Allergic/Immunologic: Negative.   Neurological: Negative.   Hematological: Negative.   Psychiatric/Behavioral: Negative.    Objective:  BP 122/72 (BP Location: Right Arm, Patient Position: Sitting, Cuff Size: Normal)   Pulse 83   Temp 98.5 F (36.9 C) (Oral)   Resp 17   Ht 5' 3.5" (1.613 m)   Wt 127 lb (57.6 kg)   LMP 05/26/2016 (Approximate)   SpO2 97%   BMI 22.14 kg/m   Physical Exam  Constitutional: She is oriented to person, place, and time and well-developed, well-nourished, and in no distress.  HENT:  Head: Normocephalic and atraumatic.  Right Ear: Hearing, tympanic membrane, external ear and ear canal normal.  Left Ear: Hearing, tympanic membrane, external ear and ear canal normal.  Nose: Mucosal edema ( more prominent in left nostril) present. Right sinus exhibits no maxillary sinus tenderness and no frontal sinus tenderness. Left sinus exhibits no maxillary sinus tenderness and no frontal sinus tenderness.  Mouth/Throat: Uvula is midline, oropharynx is clear and moist and mucous membranes are normal. No oropharyngeal exudate.  Eyes: Conjunctivae, EOM and lids are normal. Pupils are equal, round, and reactive to light. No scleral icterus.  Triangular tissue growth noted on medial aspect of left cornea.   Neck: Trachea normal and normal range of motion. No thyroid mass and no thyromegaly present.  Cardiovascular: Normal rate, regular rhythm, normal heart sounds and intact distal pulses.   Pulmonary/Chest: Effort normal and breath sounds normal.  Abdominal: Soft. Normal appearance and bowel sounds are normal. There is no  tenderness.  Lymphadenopathy:       Head (right side): No tonsillar, no preauricular, no posterior auricular and no occipital adenopathy present.       Head (left side): No tonsillar, no preauricular, no posterior auricular and no occipital adenopathy present.    She has no cervical adenopathy.       Right: No supraclavicular adenopathy present.       Left: No supraclavicular adenopathy present.  Neurological: She is alert and oriented to person, place, and time. She has normal sensation, normal strength and normal reflexes. Gait normal.  Skin: Skin is warm and dry.  Psychiatric: Affect normal.    Visual Acuity Screening   Right eye Left eye Both eyes  Without correction: 20/25 20/25 20/25   With correction:  No results found for this or any previous visit (from the past 24 hour(s)).  Assessment and Plan :  Discussed healthy lifestyle, diet, exercise, preventative care, vaccinations, and addressed patient's concerns. Plan for follow up in one year. Otherwise, plan for specific conditions below.  1. Annual physical exam Await results -Recommend continue healthy lifestyle. -Pt instructed to try lactaid milk instead of 2% milk and see if this or intermittent diarrhea and abdominal cramping related to milk ingestion. -Pt instructed to drink at least 64 oz of water a day.  2. Screening, anemia, deficiency, iron - CBC with Differential/Platelet  3. Screening, lipid - Lipid panel  4. Other fatigue - TSH  5. Screening for endocrine, metabolic and immunity disorder - COMPLETE METABOLIC PANEL WITH GFR  6. Encounter for vitamin deficiency screening - VITAMIN D 25 Hydroxy (Vit-D Deficiency, Fractures)  7. Screening for hematuria or proteinuria - POCT urinalysis dipstick - POCT Microscopic Urinalysis ([facility])  8. Nasal congestion - fluticasone (FLONASE) 50 MCG/ACT nasal spray; Place 2 sprays into both nostrils daily.  Dispense: 16 g; Refill: 6  9. Insomnia, unspecified  type -Recommended avoiding screen time for an hour prior to lying down for bed. Encouraged reading a paper book prior to bed and using OTC melatonin.   Tenna Delaine PA-C  [facility] 06/25/2016 9:20 AM

## 2016-06-25 NOTE — Patient Instructions (Addendum)
-  For diarrhea and abdominal cramping, try Lactaid milk.  -Make sure you drink at least 64 oz of water a day. -For sleep, try avoiding screen time for an hour before going to sleep. You can try reading a paper book to see if this helps. You can use OTC melatonin as needed for sleep. Try using 30 minutes before bedtime. -You can use flonase for nasal congestion, you can use OTC zytrec for nasal congestion as well to see if this -helps.  Thank you for letting me participate in your health and well being.   IF you received an x-ray today, you will receive an invoice from [facility]. Please contact [facility] at [phone] with questions or concerns regarding your invoice.   IF you received labwork today, you will receive an invoice from [employer]. Please contact [facility] at [phone] with questions or concerns regarding your invoice.   Our billing staff will not be able to assist you with questions regarding bills from these companies.  You will be contacted with the lab results as soon as they are available. The fastest way to get your results is to activate your My Chart account. Instructions are located on the last page of this paperwork. If you have not heard from [place] regarding the results in 2 weeks, please contact this office.

## 2016-06-26 LAB — T3, FREE: T3, Free: 10.7 pg/mL — ABNORMAL HIGH (ref 2.3–4.2)

## 2016-06-26 LAB — VITAMIN D 25 HYDROXY (VIT D DEFICIENCY, FRACTURES): VIT D 25 HYDROXY: 41 ng/mL (ref 30–100)

## 2016-06-26 LAB — T4, FREE: FREE T4: 3.1 ng/dL — AB (ref 0.8–1.8)

## 2016-06-30 ENCOUNTER — Other Ambulatory Visit: Payer: Self-pay | Admitting: Physician Assistant

## 2016-06-30 DIAGNOSIS — R7989 Other specified abnormal findings of blood chemistry: Secondary | ICD-10-CM

## 2016-06-30 MED ORDER — RANITIDINE HCL 150 MG PO TABS: 150.0000 mg | ORAL_TABLET | Freq: Two times a day (BID) | ORAL | 0 refills | Status: DC

## 2016-06-30 NOTE — Progress Notes (Signed)
Pt instructed that her thyroid values were abnormal and she will be referred to an endocrinologist for further evaluation. While on the phone, pt mentioned that her GERD has been acting up again. She has asked for a medication for this. I have prescribed zantac and instructed to her to follow up in 4 weeks for further evaluation.

## 2016-07-03 IMAGING — US US ABDOMEN LIMITED
1 series · 14 of 25 positions shown · non-contrast
Comparison: 12/06/2014

CLINICAL DATA: Hepatitis-B

EXAM:
US ABDOMEN LIMITED - RIGHT UPPER QUADRANT

[Series 1: us abdomen limited · 0.20mm/px · 14 of 52 slices shown]
[im 1/52]
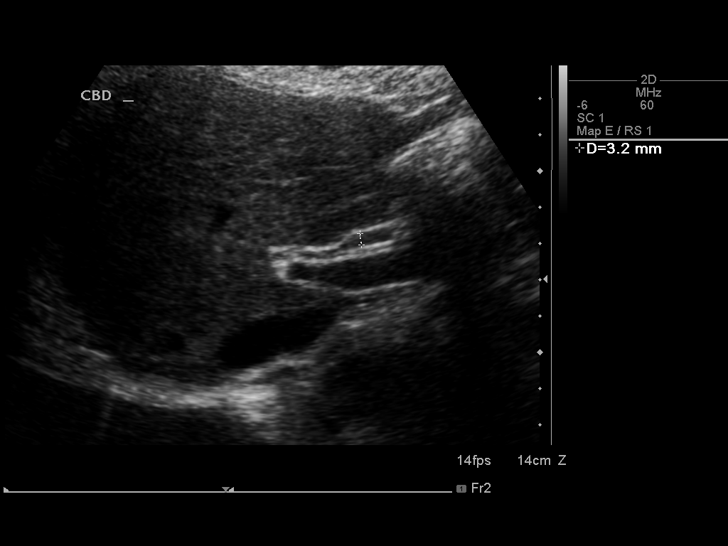
[im 5/52]
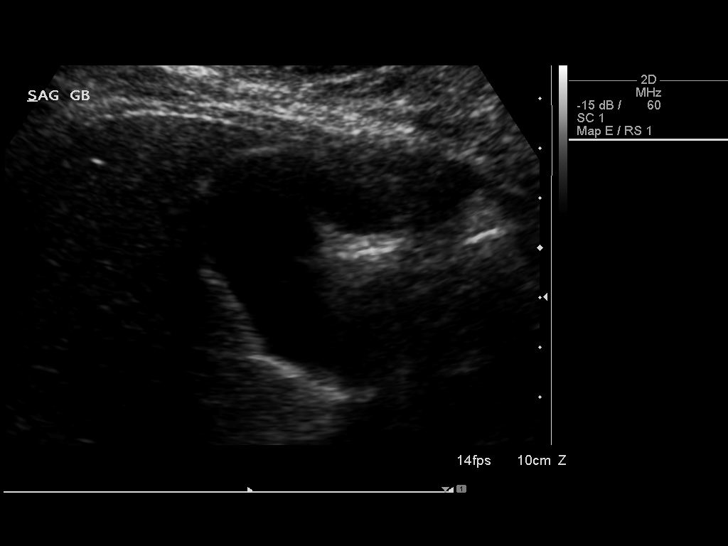
[im 9/52]
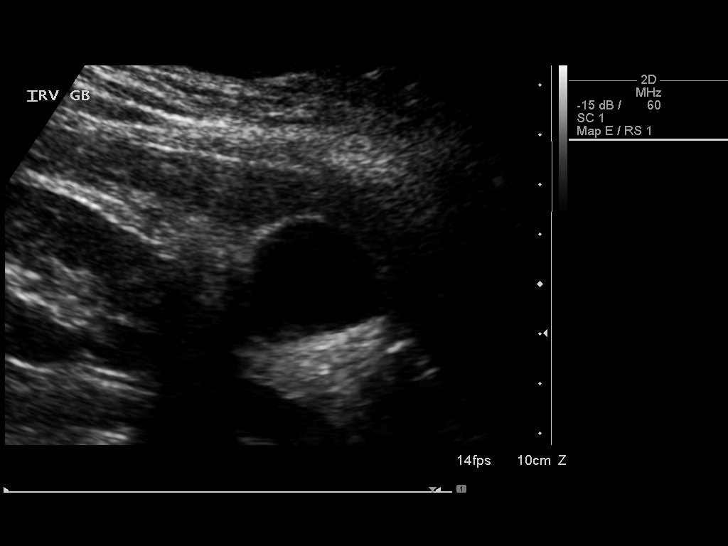
[im 13/52]
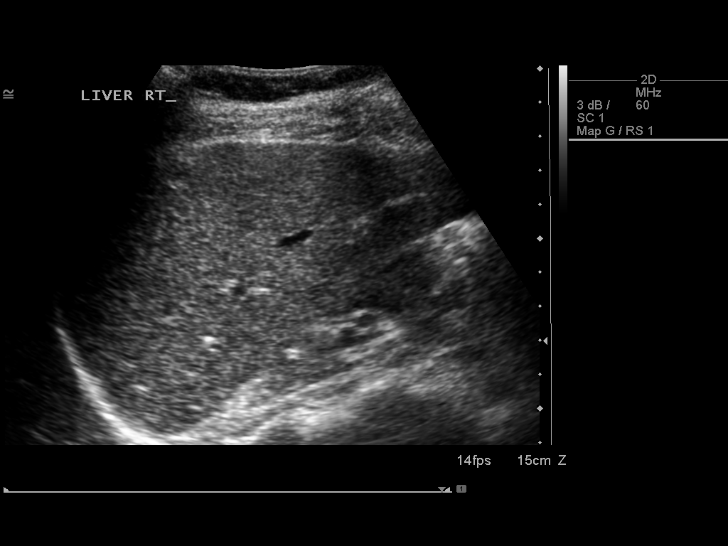
[im 18/52]
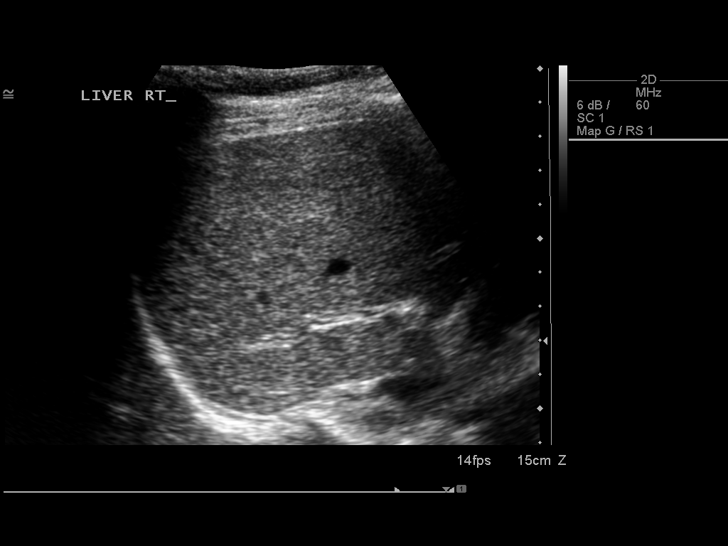
[im 20/52]
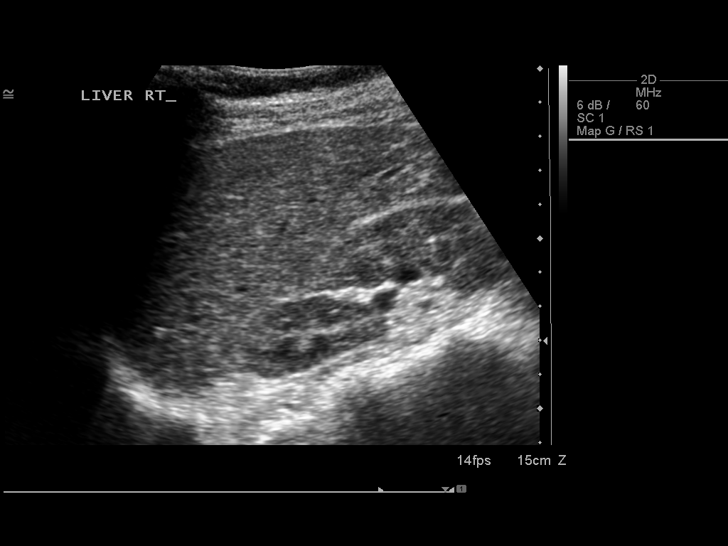
[im 24/52]
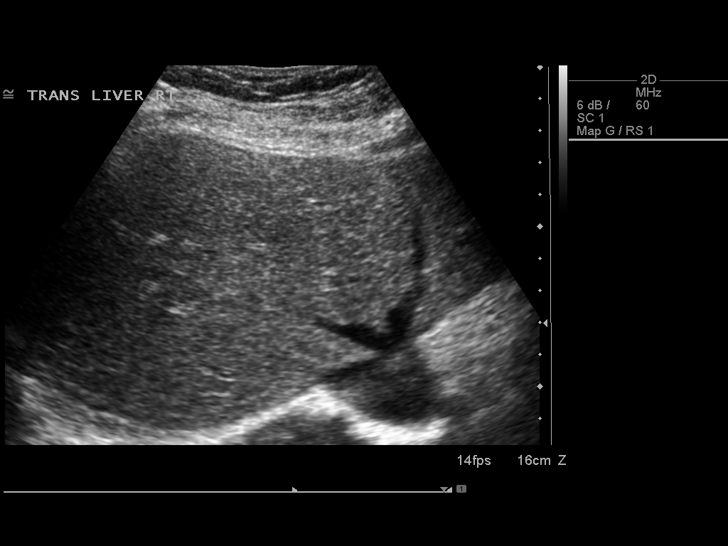
[im 28/52]
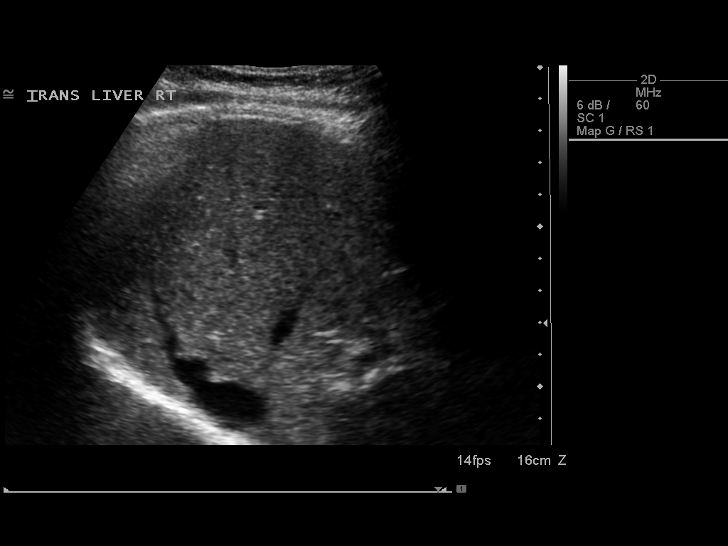
[im 32/52]
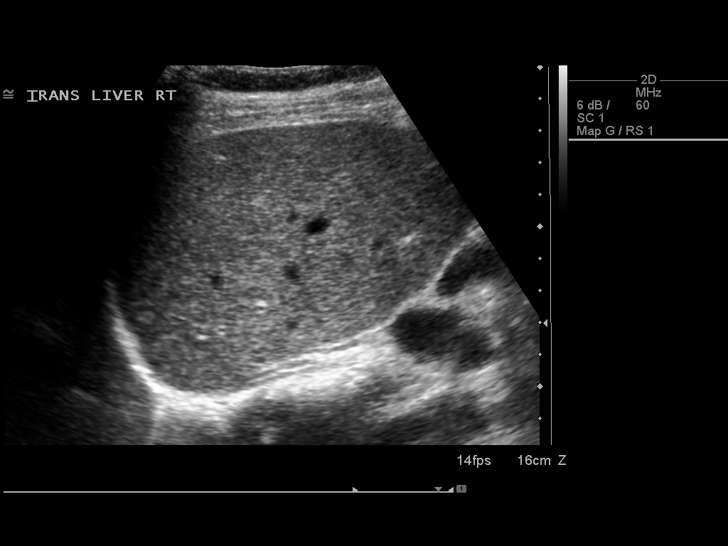
[im 35/52]
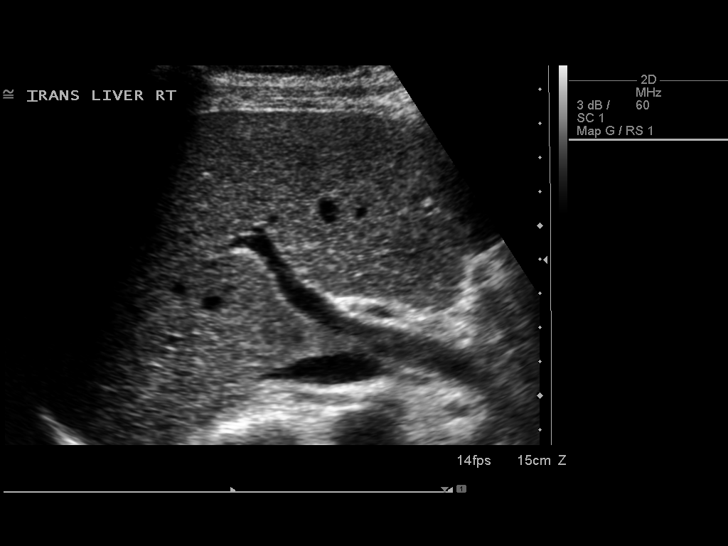
[im 39/52]
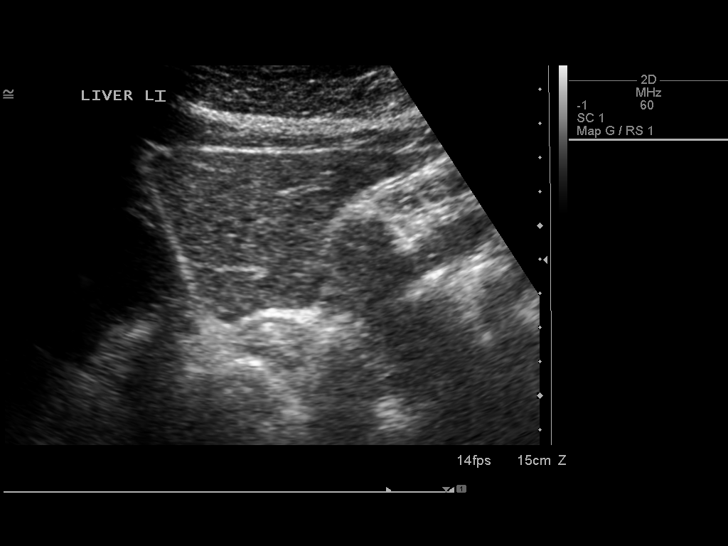
[im 43/52]
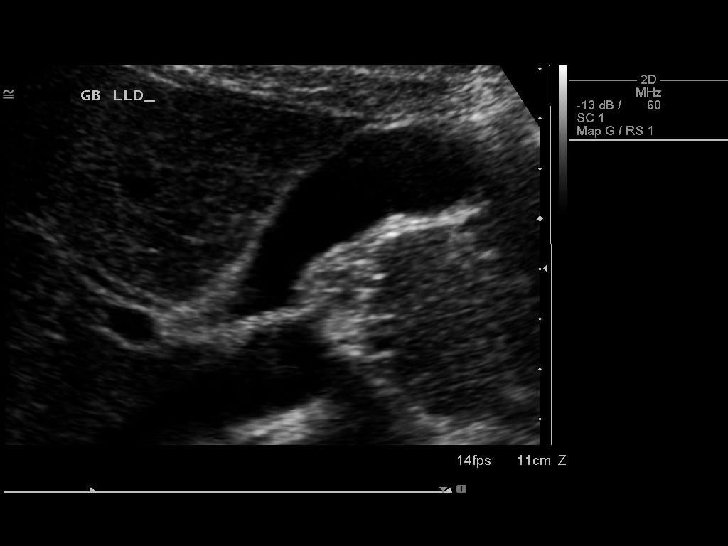
[im 47/52]
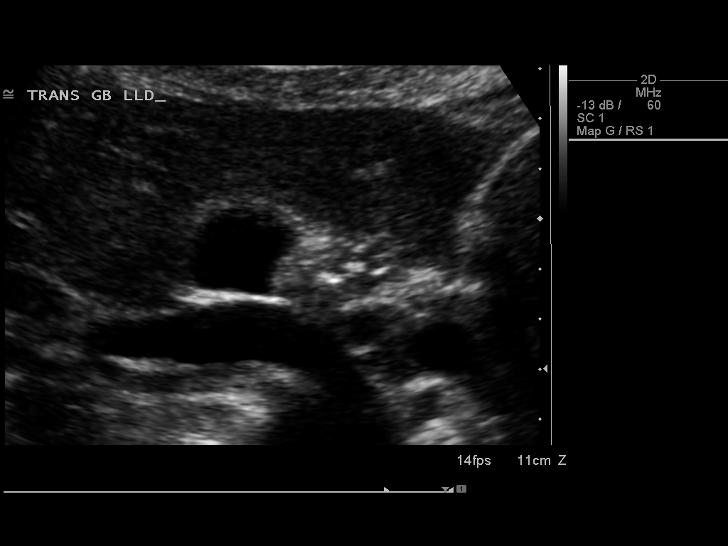
[im 52/52]
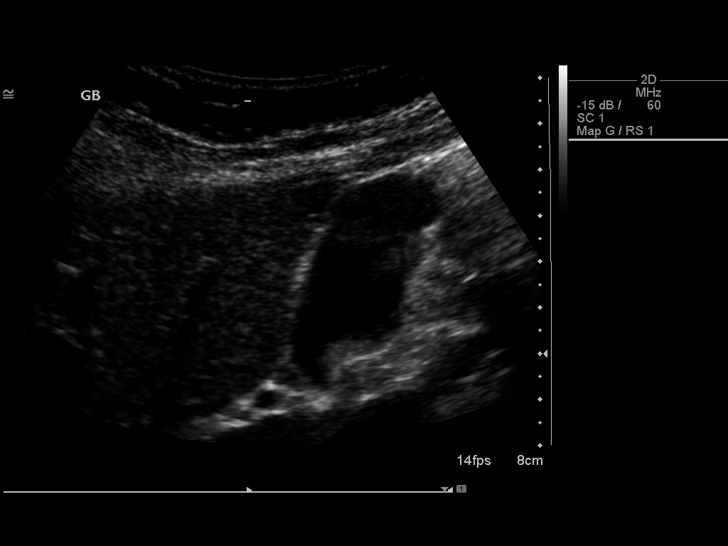

[14 of 25 positions shown; findings below may reference images not displayed]

FINDINGS: Gallbladder:

No gallstones or wall thickening visualized. No sonographic Murphy
sign noted.

Common bile duct:

Diameter: 3 mm

Liver:

No focal abnormalities.  Mildly coarsened echotexture.
IMPRESSION: No acute findings. Mildly coarsened hepatic echotexture, otherwise
negative.

## 2016-07-23 ENCOUNTER — Ambulatory Visit (INDEPENDENT_AMBULATORY_CARE_PROVIDER_SITE_OTHER): Payer: BLUE CROSS/BLUE SHIELD | Admitting: Pharmacist

## 2016-07-23 DIAGNOSIS — Z952 Presence of prosthetic heart valve: Secondary | ICD-10-CM | POA: Diagnosis not present

## 2016-07-23 DIAGNOSIS — Z7901 Long term (current) use of anticoagulants: Secondary | ICD-10-CM | POA: Diagnosis not present

## 2016-07-23 DIAGNOSIS — I359 Nonrheumatic aortic valve disorder, unspecified: Secondary | ICD-10-CM

## 2016-07-23 LAB — POCT INR: INR: 3.2

## 2016-07-30 ENCOUNTER — Ambulatory Visit (INDEPENDENT_AMBULATORY_CARE_PROVIDER_SITE_OTHER): Payer: BLUE CROSS/BLUE SHIELD | Admitting: Endocrinology

## 2016-07-30 ENCOUNTER — Encounter: Payer: Self-pay | Admitting: Endocrinology

## 2016-07-30 VITALS — BP 104/70 | HR 85 | Wt 127.0 lb

## 2016-07-30 DIAGNOSIS — E0590 Thyrotoxicosis, unspecified without thyrotoxic crisis or storm: Secondary | ICD-10-CM | POA: Diagnosis not present

## 2016-07-30 MED ORDER — METHIMAZOLE 10 MG PO TABS: ORAL_TABLET | ORAL | 2 refills | Status: DC

## 2016-07-30 NOTE — Progress Notes (Signed)
Patient ID: [name], female   DOB: 09/26/1970, 45 y.o.   MRN: [id number]                                                                                                                Reason for Appointment:  Hyperthyroidism, new consultation  Referring physician: Copland   History of Present Illness:   Patient is somewhat unclear about what symptoms she is having She apparently wanted her PCP to check her thyroid levels because of family history of thyroid disease  She does admit to having some symptoms of palpitations, feeling warmer than usual, some increase in fatigue and shortness of breath as well as mild shakiness She thinks she may have lost 5 pounds since this summer but recent rate appears to be the same She also has had increased frequency of bowel movements, 3-4 per day, previously would have mild constipation  She has not taken any over-the-counter herbal supplements lately  Wt Readings from Last 3 Encounters:  07/30/16 127 lb (57.6 kg)  06/25/16 127 lb (57.6 kg)  05/17/16 127 lb (57.6 kg)     When first evaluated with thyroid function tests they showed the following:     Lab Results  Component Value Date   FREET4 3.1 (H) 06/25/2016   T3FREE 10.7 (H) 06/25/2016   TSH 0.01 (L) 06/25/2016   TSH 1.195 08/29/2014   TSH 1.038 07/06/2013    Treatments so [id number]     Medication List       Accurate as of 07/30/16  2:25 PM. Always use your most recent med list.          entecavir 1 MG tablet Commonly known as:  BARACLUDE Take 1 mg by mouth daily.   fluticasone 50 MCG/ACT nasal spray Commonly known as:  FLONASE Place 2 sprays into both nostrils daily.   ONE DAILY MULTIVITAMIN WOMEN PO Take 1 tablet by mouth as needed.   warfarin 4 MG tablet Commonly known as:  COUMADIN Take 1 tablet by mouth daily or as directed by coumadin clinic           Past Medical History:  Diagnosis Date  . Femur fracture, left ([place]) 1990   with open reduction and  internal fixation   . H/O: rheumatic fever   . Hepatitis B carrier ([place])    positive hepatitis B carrier; maintained on Baraclude; followed by Hepatitis clinic.  Marland Kitchen History of allergic rhinitis   . Severe aortic insufficiency 09/01/2004   s/p aortic valve replacement.      Past Surgical History:  Procedure Laterality Date  . AORTIC VALVE REPLACEMENT  4/06   52mm On-X mechanical valve  . CARDIAC CATHETERIZATION  11/15/2004   Ejection fraction is estimated 55%  . HEMORRHOID SURGERY      Family History  Problem Relation Age of Onset  . Heart disease Brother     valvular dysfunction; s/p replacement  . Hypertension Mother   . Hyperlipidemia Mother   . Hypothyroidism Mother   . Heart  disease Father   . Hypertension Father   . Hyperlipidemia Father     Social History:  reports that she has never smoked. She has never used smokeless tobacco. She reports that she does not drink alcohol or use drugs.  Allergies: No Known Allergies    Review of Systems  Constitutional: Negative for reduced appetite.  HENT: Negative for trouble swallowing.   Eyes: Negative for blurred vision.       Occasionally at night and have some watering of eyes  Cardiovascular: Positive for palpitations. Negative for leg swelling.  Gastrointestinal: Positive for diarrhea.  Endocrine:       Her last menstrual cycle was longer than usual  Genitourinary: Negative for frequency.  Musculoskeletal: Negative for joint pain.  Allergic/Immunologic: Negative for hives.  Neurological: Negative for weakness.  Psychiatric/Behavioral: Positive for nervousness.      Examination:   BP 104/70   Pulse 85   Wt 127 lb (57.6 kg)   SpO2 98%   BMI 22.14 kg/m    General Appearance:  well-built and nourished, pleasant, not anxious or hyperkinetic.         Eyes: No unusual prominence, lid lag or stare. No swelling of the eyelids  Neck: The thyroid is enlarged 1.5 times normal, smooth, non-tender and diffuse, relatively  more on the right.  There is no lymphadenopathy  .          Heart: normal S1 and S2, no murmurs .          Lungs: breath sounds are clear bilaterally Abdomen: no hepatosplenomegaly or other palpable abnormality  Extremities: hands are warm. No ankle edema. Neurological: Deep tendon reflexes at biceps are slightly brisk Bilateral mild fine tremors are present. Skin: No rash, abnormal thickening of the skin on legs or pigmentation seen     Assessment/Plan:   Hyperthyroidism, Likely to be from Graves' disease   She has symptomatic Hyperthyroidism although her labs are much more abnormal than expected for the symptoms that she is having Has only minimal thyroid enlargement   Discussed with the patient the hyperthyroidism as being an autoimmune thyroid disease.  Explained the options for treatment including antithyroid drugs and radioactive iodine.  Discussed the pros and cons for each treatment: Antithyroid drugs would be reasonable for mild disease but would need frequent followup with lab monitoring as well as potential for side effects from the medications and uncertainty about long-term cure of the problem Discussed that I-131 treatment is safe and simple to do but will result in long-term hypothyroidism that will result from ablation of the thyroid tissue and the need for lifelong supplementation and periodic monitoring.  Patient handout on hyperthyroidism given  She is agreeable to a trial of antithyroid drugs initially She was started on methimazole 10 mg twice a day and follow-up in about 3 weeks  She will also have her INR checked in about 10-14 days to make sure this is not being affected by methimazole Patient understands the above discussion and treatment options. All questions were answered satisfactorily . Consultation note to the primary care physician sent   [facility] 07/30/2016, 2:25 PM

## 2016-07-31 ENCOUNTER — Telehealth: Payer: Self-pay | Admitting: Pharmacist

## 2016-07-31 NOTE — Telephone Encounter (Signed)
Patient will start methimazole 10 mg today, has INR appt on Dec 13.

## 2016-07-31 NOTE — Telephone Encounter (Signed)
Elayne Snare, MD  Erskine Emery, RPH        I am starting this patient on methimazole 10 mg twice a day today for her Graves' disease. May be desirable to check an INR in about 7-10 days, thanks      Left message to move appt as per above.

## 2016-08-13 ENCOUNTER — Ambulatory Visit (INDEPENDENT_AMBULATORY_CARE_PROVIDER_SITE_OTHER): Payer: BLUE CROSS/BLUE SHIELD | Admitting: Pharmacist

## 2016-08-13 DIAGNOSIS — I359 Nonrheumatic aortic valve disorder, unspecified: Secondary | ICD-10-CM | POA: Diagnosis not present

## 2016-08-13 DIAGNOSIS — Z952 Presence of prosthetic heart valve: Secondary | ICD-10-CM | POA: Diagnosis not present

## 2016-08-13 DIAGNOSIS — Z7901 Long term (current) use of anticoagulants: Secondary | ICD-10-CM | POA: Diagnosis not present

## 2016-08-13 LAB — POCT INR: INR: 2.5

## 2016-08-27 ENCOUNTER — Ambulatory Visit (INDEPENDENT_AMBULATORY_CARE_PROVIDER_SITE_OTHER): Payer: BLUE CROSS/BLUE SHIELD | Admitting: Physician Assistant

## 2016-08-27 VITALS — BP 104/68 | HR 88 | Temp 98.1°F | Resp 18 | Ht 63.5 in | Wt 124.0 lb

## 2016-08-27 DIAGNOSIS — L239 Allergic contact dermatitis, unspecified cause: Secondary | ICD-10-CM | POA: Diagnosis not present

## 2016-08-27 DIAGNOSIS — L299 Pruritus, unspecified: Secondary | ICD-10-CM

## 2016-08-27 DIAGNOSIS — R21 Rash and other nonspecific skin eruption: Secondary | ICD-10-CM

## 2016-08-27 MED ORDER — TRIAMCINOLONE ACETONIDE 0.1 % EX CREA: 1.0000 "application " | TOPICAL_CREAM | Freq: Two times a day (BID) | CUTANEOUS | 0 refills | Status: DC

## 2016-08-27 MED ORDER — DIPHENHYDRAMINE HCL 25 MG PO TABS: 25.0000 mg | ORAL_TABLET | Freq: Four times a day (QID) | ORAL | 0 refills | Status: DC | PRN

## 2016-08-27 MED ORDER — HYDROXYZINE HCL 25 MG PO TABS: 25.0000 mg | ORAL_TABLET | Freq: Three times a day (TID) | ORAL | 0 refills | Status: DC | PRN

## 2016-08-27 NOTE — Patient Instructions (Addendum)
Please take Benadryl every 4-6 hours for the next five days.  Atarax will help reduce your itching. Please take as directed.  Apply Kenalog cream to affected area three times a day as needed for itching. Remove all jewelry until your symptoms resolve. Do not add any new soaps, detergents, lotions, clothes, bedding.   Return to the clinic if you are not better in 5-7 days. Please go to the emergency department if your symptoms worsen, if you develop difficulty breathing or shortness of breath.   Thank you for coming in today. I hope you feel we met your needs.  Feel free to call [facility] if you have any questions or further requests.  Please consider signing up for MyChart if you do not already have it, as this is a great way to communicate with me.  Best,  Whitney McVey, PA-C  IF you received an x-ray today, you will receive an invoice from [facility]. Please contact [facility] at [phone] with questions or concerns regarding your invoice.   IF you received labwork today, you will receive an invoice from [facility]. Please contact [facility] at [phone] with questions or concerns regarding your invoice.   Our billing staff will not be able to assist you with questions regarding bills from these companies.  You will be contacted with the lab results as soon as they are available. The fastest way to get your results is to activate your My Chart account. Instructions are located on the last page of this paperwork. If you have not heard from [place] regarding the results in 2 weeks, please contact this office.

## 2016-08-27 NOTE — Progress Notes (Signed)
[name]  MRN: [id number] DOB: [birth year]  PCP: Lamar Blinks, MD  Subjective:  Pt is a 45 year old female PMH Hepatitis B, and is on chronic warfarin for aortic valve disorder who presents to clinic for rash. She has a red, itchy rash x 1.5 days. Located on her right shoulder, hip and back side of her right leg. It started on her right shoulder.  No new jewelry, soap, detergent. She did wear a new coat a few days ago.  Yesterday she bought Cortisone 10, tried Benadryl twice. She feels a little better after she takes Benadryl. Cortisone not helping.  Denies abdominal pain, nausea, vomiting, SOB, chest tightness, chest pain, headache, dizziness.  Review of Systems  Constitutional: Negative for chills, diaphoresis, fatigue and fever.  HENT: Negative for congestion, postnasal drip, rhinorrhea, sinus pressure, sneezing and sore throat.   Respiratory: Negative for cough, chest tightness, shortness of breath and wheezing.   Cardiovascular: Negative for chest pain and palpitations.  Gastrointestinal: Negative for abdominal pain, diarrhea, nausea and vomiting.  Skin: Positive for rash.  Neurological: Negative for weakness, light-headedness and headaches.    Patient Active Problem List   Diagnosis Date Noted  . Vasovagal syncope 05/02/2015  . Hepatitis B 04/28/2012  . Aortic valve disorder 02/11/2012  . Long term (current) use of anticoagulants 02/11/2012  . S/P AVR 12/16/2011  . Chronic anticoagulation 12/16/2011  . PULMONARY NODULE 01/31/2010  . DYSPNEA 01/31/2010    Current Outpatient Prescriptions on File Prior to Visit  Medication Sig Dispense Refill  . entecavir (BARACLUDE) 1 MG tablet Take 1 mg by mouth daily.    . methimazole (TAPAZOLE) 10 MG tablet 1 tab bid 60 tablet 2  . Multiple Vitamins-Minerals (ONE DAILY MULTIVITAMIN WOMEN PO) Take 1 tablet by mouth as needed.     . warfarin (COUMADIN) 4 MG tablet Take 1 tablet by mouth daily or as directed by coumadin clinic 30  tablet 3  . fluticasone (FLONASE) 50 MCG/ACT nasal spray Place 2 sprays into both nostrils daily. (Patient not taking: Reported on 08/27/2016) 16 g 6   No current facility-administered medications on file prior to visit.     No Known Allergies   Objective:  BP 104/68 (BP Location: Right Arm, Patient Position: Sitting, Cuff Size: Small)   Pulse 88   Temp 98.1 F (36.7 C) (Oral)   Resp 18   Ht 5' 3.5" (1.613 m)   Wt 124 lb (56.2 kg)   LMP 08/18/2016   SpO2 98%   BMI 21.62 kg/m   Physical Exam  Constitutional: She is oriented to person, place, and time and well-developed, well-nourished, and in no distress. No distress.  Cardiovascular: Normal rate, regular rhythm and normal heart sounds.   Neurological: She is alert and oriented to person, place, and time. GCS score is 15.  Skin: Skin is warm and dry. Rash noted. No purpura noted. Rash is maculopapular and urticarial (Right shoulder, right hip, right popliteal fossa). Rash is not pustular.  Psychiatric: Mood, memory, affect and judgment normal.  Vitals reviewed.   Assessment and Plan :  1. Rash and nonspecific skin eruption 2. Allergic dermatitis 3. Itching - hydrOXYzine (ATARAX/VISTARIL) 25 MG tablet; Take 1 tablet (25 mg total) by mouth 3 (three) times daily as needed.  Dispense: 30 tablet; Refill: 0 - diphenhydrAMINE (BENADRYL) 25 MG tablet; Take 1 tablet (25 mg total) by mouth every 6 (six) hours as needed.  Dispense: 30 tablet; Refill: 0 - triamcinolone cream (KENALOG) 0.1 %; Apply  1 application topically 2 (two) times daily.  Dispense: 30 g; Refill: 0 - Suspect allergic reaction, possibly from her new jacket. Advised Benadryl q 4-6 hours for five days.  RTC in 5-7 days if no improvement. Consider biopsy. Discussed symptoms that should lead her to the emergency department. She understands and agrees with plan.   Mercer Pod, PA-C  [facility] 08/27/2016 10:00 AM

## 2016-09-02 ENCOUNTER — Other Ambulatory Visit (INDEPENDENT_AMBULATORY_CARE_PROVIDER_SITE_OTHER): Payer: BLUE CROSS/BLUE SHIELD

## 2016-09-02 DIAGNOSIS — E0590 Thyrotoxicosis, unspecified without thyrotoxic crisis or storm: Secondary | ICD-10-CM | POA: Diagnosis not present

## 2016-09-02 LAB — T4, FREE: FREE T4: 1.98 ng/dL — AB (ref 0.60–1.60)

## 2016-09-02 LAB — TSH: TSH: 0.21 u[IU]/mL — AB (ref 0.35–4.50)

## 2016-09-02 LAB — T3, FREE: T3, Free: 6.8 pg/mL — ABNORMAL HIGH (ref 2.3–4.2)

## 2016-09-03 ENCOUNTER — Encounter: Payer: Self-pay | Admitting: Endocrinology

## 2016-09-03 ENCOUNTER — Ambulatory Visit (INDEPENDENT_AMBULATORY_CARE_PROVIDER_SITE_OTHER): Payer: BLUE CROSS/BLUE SHIELD | Admitting: Endocrinology

## 2016-09-03 ENCOUNTER — Ambulatory Visit (INDEPENDENT_AMBULATORY_CARE_PROVIDER_SITE_OTHER): Payer: BLUE CROSS/BLUE SHIELD | Admitting: Pharmacist

## 2016-09-03 VITALS — BP 119/70 | HR 96 | Ht 64.57 in | Wt 126.8 lb

## 2016-09-03 DIAGNOSIS — Z7901 Long term (current) use of anticoagulants: Secondary | ICD-10-CM

## 2016-09-03 DIAGNOSIS — Z952 Presence of prosthetic heart valve: Secondary | ICD-10-CM | POA: Diagnosis not present

## 2016-09-03 DIAGNOSIS — I359 Nonrheumatic aortic valve disorder, unspecified: Secondary | ICD-10-CM | POA: Diagnosis not present

## 2016-09-03 DIAGNOSIS — E0590 Thyrotoxicosis, unspecified without thyrotoxic crisis or storm: Secondary | ICD-10-CM

## 2016-09-03 LAB — THYROTROPIN RECEPTOR AUTOABS: THYROTROPIN RECEPTOR AB: 15.97 IU/L — AB (ref 0.00–1.75)

## 2016-09-03 LAB — POCT INR: INR: 2.6

## 2016-09-03 MED ORDER — METHIMAZOLE 10 MG PO TABS: ORAL_TABLET | ORAL | 2 refills | Status: DC

## 2016-09-03 NOTE — Patient Instructions (Addendum)
Take 2 pills in am and 1 at dinner  Stop Methimazole for 1 week before test at hospital

## 2016-09-03 NOTE — Progress Notes (Signed)
Patient ID: [name], female   DOB: [birth year], 46 y.o.   MRN: [id number]                                                                                                                Reason for Appointment:  Hyperthyroidism, follow-up visit  Referring physician: Janett Billow Chang   History of Present Illness:   Prior history: Patient is somewhat unclear about what symptoms she is having She apparently wanted her PCP to check her thyroid levels because of family history of thyroid disease  She was having some symptoms of palpitations, feeling warmer than usual, some increase in fatigue and shortness of breath as well as mild shakiness She thinks she may have lost 5 pounds since summer of 2017 She also  had increased frequency of bowel movements, 3-4 per day, previously would have mild constipation  Recent history:  She was started on methimazole 10 mg twice a day in 11/17 and is here for follow-up about 5 weeks later She has no difficulty taking the medication and is doing this regularly She does feel better with all of her symptoms above although her weight is not improved. Appetite is normal Still has 2-3 bowel movements a day but otherwise feels fairly good in general  Has not had any change in her INR with starting with methimazole  Wt Readings from Last 3 Encounters:  09/03/16 126 lb 12.8 oz (57.5 kg)  08/27/16 124 lb (56.2 kg)  07/30/16 127 lb (57.6 kg)     Her thyroid levels are as follows: Still has increased free T4 and T3 levels although improved  Lab Results  Component Value Date   FREET4 1.98 (H) 09/02/2016   FREET4 3.1 (H) 06/25/2016   T3FREE 6.8 (H) 09/02/2016   T3FREE 10.7 (H) 06/25/2016   TSH 0.21 (L) 09/02/2016   TSH 0.01 (L) 06/25/2016   TSH 1.195 08/29/2014    Lab Results  Component Value Date   THYROTRECAB 15.97 (H) 09/02/2016     Allergies as of 09/03/2016   No Known Allergies     Medication List       Accurate as of 09/03/16 11:28 AM.  Always use your most recent med list.          entecavir 1 MG tablet Commonly known as:  BARACLUDE Take 1 mg by mouth daily.   ONE DAILY MULTIVITAMIN WOMEN PO Take 1 tablet by mouth as needed.   triamcinolone cream 0.1 % Commonly known as:  KENALOG Apply 1 application topically 2 (two) times daily.   warfarin 4 MG tablet Commonly known as:  COUMADIN Take 1 tablet by mouth daily or as directed by coumadin clinic           Past Medical History:  Diagnosis Date  . Femur fracture, left ([place]) 1990   with open reduction and internal fixation   . H/O: rheumatic fever   . Hepatitis B carrier ([place])    positive hepatitis B carrier; maintained on Baraclude; followed by Hepatitis clinic.  Marland Kitchen  History of allergic rhinitis   . Severe aortic insufficiency 09/01/2004   s/p aortic valve replacement.      Past Surgical History:  Procedure Laterality Date  . AORTIC VALVE REPLACEMENT  4/06   22mm On-X mechanical valve  . CARDIAC CATHETERIZATION  11/15/2004   Ejection fraction is estimated 55%  . HEMORRHOID SURGERY      Family History  Problem Relation Age of Onset  . Heart disease Brother     valvular dysfunction; s/p replacement  . Hypertension Mother   . Hyperlipidemia Mother   . Hypothyroidism Mother   . Heart disease Father   . Hypertension Father   . Hyperlipidemia Father     Social History:  reports that she has never smoked. She has never used smokeless tobacco. She reports that she does not drink alcohol or use drugs.  Allergies: No Known Allergies    Review of Systems      Examination:   BP 119/70   Pulse 96   Ht 5' 4.57" (1.64 m)   Wt 126 lb 12.8 oz (57.5 kg)   LMP 08/18/2016   SpO2 97%   BMI 21.38 kg/m   Repeat pulse 88, regular Minimal fine tremors present. Thyroid is enlarged about twice normal, mostly on the right side, smooth and relatively form Reflexes are brisk   Assessment/Plan:   Hyperthyroidism, from Graves' disease   She has  symptomatic Hyperthyroidism with a goiter She has a significantly high thyrotropin receptor antibody currently Although her thyroid levels are better and she is symptomatically doing well she is unlikely to be successfully treated with antithyroid drugs with her high thyrotropin antibody and requiring more than 20 mg of methimazole  Discussed the options for treatment including doing the radioactive iodine treatment now as a more definitive way of treating her She is agreeable to doing this and will schedule Meanwhile will increase methimazole to 30 mg a day Follow-up in about 6 weeks   [name] 09/03/2016, 11:28 AM

## 2016-09-10 ENCOUNTER — Telehealth: Payer: Self-pay | Admitting: Endocrinology

## 2016-09-10 NOTE — Telephone Encounter (Signed)
Patient has some question about referral that was sent out, what do she need to do. Please advise

## 2016-09-12 NOTE — Telephone Encounter (Signed)
What specifically is her question? She is going to get an uptake test at [facility].

## 2016-09-15 NOTE — Telephone Encounter (Signed)
Pt was given the times of her appts for 1/17 and 18, she will be going to [place]

## 2016-09-17 ENCOUNTER — Ambulatory Visit (HOSPITAL_COMMUNITY): Payer: BLUE CROSS/BLUE SHIELD

## 2016-09-18 ENCOUNTER — Encounter (HOSPITAL_COMMUNITY): Payer: BLUE CROSS/BLUE SHIELD

## 2016-09-23 DIAGNOSIS — J209 Acute bronchitis, unspecified: Secondary | ICD-10-CM | POA: Diagnosis not present

## 2016-09-29 ENCOUNTER — Encounter (HOSPITAL_COMMUNITY): Payer: Self-pay

## 2016-09-30 ENCOUNTER — Encounter (HOSPITAL_COMMUNITY): Payer: Self-pay

## 2016-09-30 ENCOUNTER — Encounter (HOSPITAL_COMMUNITY)
Admission: RE | Admit: 2016-09-30 | Discharge: 2016-09-30 | Disposition: A | Discharge status: Home / Self Care | Payer: BLUE CROSS/BLUE SHIELD | Source: Ambulatory Visit | Attending: Endocrinology | Admitting: Endocrinology

## 2016-09-30 DIAGNOSIS — E0590 Thyrotoxicosis, unspecified without thyrotoxic crisis or storm: Secondary | ICD-10-CM | POA: Insufficient documentation

## 2016-09-30 MED ORDER — SODIUM IODIDE I 131 CAPSULE: 7.0000 | Freq: Once | INTRAVENOUS | Status: DC | PRN

## 2016-10-01 ENCOUNTER — Encounter (HOSPITAL_COMMUNITY)
Admission: RE | Admit: 2016-10-01 | Discharge: 2016-10-01 | Disposition: A | Discharge status: Home / Self Care | Payer: BLUE CROSS/BLUE SHIELD | Source: Ambulatory Visit | Attending: Endocrinology | Admitting: Endocrinology

## 2016-10-01 ENCOUNTER — Other Ambulatory Visit: Payer: Self-pay | Admitting: Endocrinology

## 2016-10-01 ENCOUNTER — Telehealth: Payer: Self-pay | Admitting: Endocrinology

## 2016-10-01 DIAGNOSIS — E0590 Thyrotoxicosis, unspecified without thyrotoxic crisis or storm: Secondary | ICD-10-CM | POA: Insufficient documentation

## 2016-10-01 NOTE — Telephone Encounter (Signed)
Pt called in and wants to know what she needs to do about her medication now that she has had her uptake and scan.

## 2016-10-02 NOTE — Telephone Encounter (Signed)
I have ordered radioactive iodine treatment and she will need to start the methimazole 3 days after the treatment is done. I had sent a staff message to [place] about rescheduling her follow-up

## 2016-10-03 NOTE — Telephone Encounter (Signed)
Pt has been made aware of this info and her appt is made

## 2016-10-06 ENCOUNTER — Ambulatory Visit (INDEPENDENT_AMBULATORY_CARE_PROVIDER_SITE_OTHER): Payer: BLUE CROSS/BLUE SHIELD | Admitting: Pharmacist

## 2016-10-06 DIAGNOSIS — Z952 Presence of prosthetic heart valve: Secondary | ICD-10-CM | POA: Diagnosis not present

## 2016-10-06 DIAGNOSIS — I359 Nonrheumatic aortic valve disorder, unspecified: Secondary | ICD-10-CM | POA: Diagnosis not present

## 2016-10-06 DIAGNOSIS — Z7901 Long term (current) use of anticoagulants: Secondary | ICD-10-CM | POA: Diagnosis not present

## 2016-10-06 LAB — POCT INR: INR: 5.1

## 2016-10-07 DIAGNOSIS — B169 Acute hepatitis B without delta-agent and without hepatic coma: Secondary | ICD-10-CM | POA: Diagnosis not present

## 2016-10-08 DIAGNOSIS — B181 Chronic viral hepatitis B without delta-agent: Secondary | ICD-10-CM | POA: Diagnosis not present

## 2016-10-10 ENCOUNTER — Other Ambulatory Visit: Payer: BLUE CROSS/BLUE SHIELD

## 2016-10-14 ENCOUNTER — Telehealth: Payer: Self-pay | Admitting: Endocrinology

## 2016-10-14 NOTE — Telephone Encounter (Signed)
Patient ask you to give her a call °

## 2016-10-15 ENCOUNTER — Ambulatory Visit: Payer: BLUE CROSS/BLUE SHIELD | Admitting: Endocrinology

## 2016-10-17 ENCOUNTER — Ambulatory Visit (HOSPITAL_COMMUNITY): Payer: BLUE CROSS/BLUE SHIELD

## 2016-10-20 DIAGNOSIS — J209 Acute bronchitis, unspecified: Secondary | ICD-10-CM | POA: Diagnosis not present

## 2016-10-22 ENCOUNTER — Ambulatory Visit (HOSPITAL_COMMUNITY)
Admission: RE | Admit: 2016-10-22 | Discharge: 2016-10-22 | Disposition: A | Discharge status: Home / Self Care | Payer: BLUE CROSS/BLUE SHIELD | Source: Ambulatory Visit | Attending: Endocrinology | Admitting: Endocrinology

## 2016-10-22 DIAGNOSIS — E0590 Thyrotoxicosis, unspecified without thyrotoxic crisis or storm: Secondary | ICD-10-CM | POA: Diagnosis not present

## 2016-10-22 LAB — HCG, SERUM, QUALITATIVE: Preg, Serum: NEGATIVE

## 2016-10-22 MED ORDER — SODIUM IODIDE I 131 CAPSULE
17.8000 | Freq: Once | INTRAVENOUS | Status: AC | PRN
  Administered 2016-10-22: 17.8 via ORAL

## 2016-10-23 NOTE — Telephone Encounter (Signed)
Called patient but she stated she did not have a question - gave her upcoming appointment dates

## 2016-10-29 ENCOUNTER — Ambulatory Visit (INDEPENDENT_AMBULATORY_CARE_PROVIDER_SITE_OTHER): Payer: BLUE CROSS/BLUE SHIELD | Admitting: Pharmacist

## 2016-10-29 ENCOUNTER — Other Ambulatory Visit (INDEPENDENT_AMBULATORY_CARE_PROVIDER_SITE_OTHER): Payer: BLUE CROSS/BLUE SHIELD

## 2016-10-29 DIAGNOSIS — Z952 Presence of prosthetic heart valve: Secondary | ICD-10-CM

## 2016-10-29 DIAGNOSIS — I359 Nonrheumatic aortic valve disorder, unspecified: Secondary | ICD-10-CM | POA: Diagnosis not present

## 2016-10-29 DIAGNOSIS — E0590 Thyrotoxicosis, unspecified without thyrotoxic crisis or storm: Secondary | ICD-10-CM | POA: Diagnosis not present

## 2016-10-29 DIAGNOSIS — Z7901 Long term (current) use of anticoagulants: Secondary | ICD-10-CM | POA: Diagnosis not present

## 2016-10-29 LAB — T3, FREE: T3, Free: 5.7 pg/mL — ABNORMAL HIGH (ref 2.3–4.2)

## 2016-10-29 LAB — T4, FREE: FREE T4: 2.27 ng/dL — AB (ref 0.60–1.60)

## 2016-10-29 LAB — POCT INR: INR: 4

## 2016-11-04 ENCOUNTER — Other Ambulatory Visit: Payer: Self-pay | Admitting: Pharmacist Clinician (PhC)/ Clinical Pharmacy Specialist

## 2016-11-04 MED ORDER — WARFARIN SODIUM 4 MG PO TABS: ORAL_TABLET | ORAL | 1 refills | Status: DC

## 2016-11-05 ENCOUNTER — Encounter: Payer: Self-pay | Admitting: Endocrinology

## 2016-11-05 ENCOUNTER — Ambulatory Visit (INDEPENDENT_AMBULATORY_CARE_PROVIDER_SITE_OTHER): Payer: BLUE CROSS/BLUE SHIELD | Admitting: Endocrinology

## 2016-11-05 VITALS — BP 100/60 | HR 86 | Ht 65.0 in | Wt 121.0 lb

## 2016-11-05 DIAGNOSIS — E0590 Thyrotoxicosis, unspecified without thyrotoxic crisis or storm: Secondary | ICD-10-CM

## 2016-11-05 NOTE — Progress Notes (Signed)
Patient ID: [name], female   DOB: [birth year], 46 y.o.   MRN: [id number]                                                                                                                Reason for Appointment:  Hyperthyroidism, follow-up visit  Referring physician: Janett Billow Copland   History of Present Illness:   Prior history: Patient is somewhat unclear about what symptoms she is having She apparently wanted her PCP to check her thyroid levels because of family history of thyroid disease  She was having some symptoms of palpitations, feeling warmer than usual, some increase in fatigue and shortness of breath as well as mild shakiness She thinks she may have lost 5 pounds since summer of 2017 She also  had increased frequency of bowel movements, 3-4 per day, previously would have mild constipation  Recent history:  She was started on methimazole 10 mg twice a day in 11/17  Since she continued to have Hyperthyroidism leave and 30 mg of methimazole and had persistently high thyrotropin receptor antibody she was referred for I-131 treatment  She had her radioactive iodine treatment with 17.8 mCi on 10/22/2016  She does feel better with all of her symptoms including palpitations However she has lost weight She says she did not get instructions to resume her methimazole after her I-131 treatment even though this was documented and has not taken this  Appetite is normal but her weight has gone down 5 pound Main complaint is hair loss since her I-131 treatment Has only occasional feelings of excessive heat   Wt Readings from Last 3 Encounters:  11/05/16 121 lb (54.9 kg)  09/03/16 126 lb 12.8 oz (57.5 kg)  08/27/16 124 lb (56.2 kg)     Her thyroid levels are as follows: Still has increased free T4 and T3 levels    Lab Results  Component Value Date   FREET4 2.27 (H) 10/29/2016   FREET4 1.98 (H) 09/02/2016   FREET4 3.1 (H) 06/25/2016   T3FREE 5.7 (H) 10/29/2016   T3FREE 6.8  (H) 09/02/2016   T3FREE 10.7 (H) 06/25/2016   TSH 0.21 (L) 09/02/2016   TSH 0.01 (L) 06/25/2016   TSH 1.195 08/29/2014    Lab Results  Component Value Date   THYROTRECAB 15.97 (H) 09/02/2016     Allergies as of 11/05/2016   No Known Allergies     Medication List       Accurate as of 11/05/16 10:43 AM. Always use your most recent med list.          entecavir 1 MG tablet Commonly known as:  BARACLUDE Take 1 mg by mouth daily.   methimazole 10 MG tablet Commonly known as:  TAPAZOLE 2 tablets in a.m. and 1 tablet at dinner   ONE DAILY MULTIVITAMIN WOMEN PO Take 1 tablet by mouth as needed.   triamcinolone cream 0.1 % Commonly known as:  KENALOG Apply 1 application topically 2 (two) times daily.   warfarin 4 MG tablet Commonly  known as:  COUMADIN Take 1/2 to 1 tablet by mouth daily as directed by coumadin clinic           Past Medical History:  Diagnosis Date  . Femur fracture, left ([place]) 1990   with open reduction and internal fixation   . H/O: rheumatic fever   . Hepatitis B carrier ([place])    positive hepatitis B carrier; maintained on Baraclude; followed by Hepatitis clinic.  Marland Kitchen History of allergic rhinitis   . Severe aortic insufficiency 09/01/2004   s/p aortic valve replacement.      Past Surgical History:  Procedure Laterality Date  . AORTIC VALVE REPLACEMENT  4/06   26mm On-X mechanical valve  . CARDIAC CATHETERIZATION  11/15/2004   Ejection fraction is estimated 55%  . HEMORRHOID SURGERY      Family History  Problem Relation Age of Onset  . Heart disease Brother     valvular dysfunction; s/p replacement  . Hypertension Mother   . Hyperlipidemia Mother   . Hypothyroidism Mother   . Heart disease Father   . Hypertension Father   . Hyperlipidemia Father     Social History:  reports that she has never smoked. She has never used smokeless tobacco. She reports that she does not drink alcohol or use drugs.  Allergies: No Known Allergies      Review of Systems    Examination:   BP 100/60   Pulse 86   Ht 5\' 5"  (1.651 m)   Wt 121 lb (54.9 kg)   LMP 10/13/2016 (Within Days)   SpO2 97%   BMI 20.14 kg/m    No fine tremors present. Thyroid is enlarged about 1.5 normal, mostly on the right side, smooth and relatively form Reflexes are brisk at biceps   Assessment/Plan:   Hyperthyroidism, from Graves' disease   She has improved symptoms with I-131 treatment despite not taking methimazole Her weight has gone down Also she is having some hair loss possibly from changing her thyroid levels  Discussed that she may have some release of excessive thyroid hormone from the I-131 treatment Also she may become hypothyroid in about 6 weeks  Meanwhile will resume methimazole with 20 mg daily for the next 2 weeks or improvement in her thyroid levels and weight Follow-up in about 6 weeks   Sherif Millspaugh 11/05/2016, 10:43 AM

## 2016-11-05 NOTE — Patient Instructions (Signed)
Take 2 pills daily till march 21st

## 2016-12-03 ENCOUNTER — Ambulatory Visit: Payer: BLUE CROSS/BLUE SHIELD | Admitting: Cardiology

## 2016-12-04 ENCOUNTER — Telehealth: Payer: Self-pay | Admitting: Endocrinology

## 2016-12-04 NOTE — Telephone Encounter (Signed)
Pt called in and was wondering if she needs to be back on the medication she was told to stop or just wait until she comes into her appointment.

## 2016-12-05 NOTE — Telephone Encounter (Signed)
Left vm letting her know not to start back on methimazole

## 2016-12-05 NOTE — Telephone Encounter (Signed)
She does not need to go back to methimazole

## 2016-12-10 ENCOUNTER — Ambulatory Visit (INDEPENDENT_AMBULATORY_CARE_PROVIDER_SITE_OTHER): Payer: BLUE CROSS/BLUE SHIELD | Admitting: Pharmacist Clinician (PhC)/ Clinical Pharmacy Specialist

## 2016-12-10 DIAGNOSIS — I359 Nonrheumatic aortic valve disorder, unspecified: Secondary | ICD-10-CM

## 2016-12-10 DIAGNOSIS — Z952 Presence of prosthetic heart valve: Secondary | ICD-10-CM

## 2016-12-10 DIAGNOSIS — Z7901 Long term (current) use of anticoagulants: Secondary | ICD-10-CM | POA: Diagnosis not present

## 2016-12-10 LAB — POCT INR: INR: 1.6

## 2016-12-12 ENCOUNTER — Other Ambulatory Visit: Payer: BLUE CROSS/BLUE SHIELD

## 2016-12-12 ENCOUNTER — Encounter: Payer: Self-pay | Admitting: Cardiology

## 2016-12-15 ENCOUNTER — Other Ambulatory Visit (INDEPENDENT_AMBULATORY_CARE_PROVIDER_SITE_OTHER): Payer: BLUE CROSS/BLUE SHIELD

## 2016-12-15 DIAGNOSIS — E0590 Thyrotoxicosis, unspecified without thyrotoxic crisis or storm: Secondary | ICD-10-CM

## 2016-12-15 LAB — TSH: TSH: 0 u[IU]/mL — ABNORMAL LOW (ref 0.35–4.50)

## 2016-12-15 LAB — T3, FREE: T3, Free: 3.7 pg/mL (ref 2.3–4.2)

## 2016-12-15 LAB — T4, FREE: Free T4: 0.81 ng/dL (ref 0.60–1.60)

## 2016-12-17 ENCOUNTER — Encounter: Payer: Self-pay | Admitting: Endocrinology

## 2016-12-17 ENCOUNTER — Ambulatory Visit (INDEPENDENT_AMBULATORY_CARE_PROVIDER_SITE_OTHER): Payer: BLUE CROSS/BLUE SHIELD | Admitting: Endocrinology

## 2016-12-17 VITALS — BP 104/70 | HR 85 | Ht 65.0 in | Wt 126.0 lb

## 2016-12-17 DIAGNOSIS — E0590 Thyrotoxicosis, unspecified without thyrotoxic crisis or storm: Secondary | ICD-10-CM

## 2016-12-17 NOTE — Progress Notes (Addendum)
Patient ID: [name], female   DOB: July 16, 1971, 46 y.o.   MRN: [id number]                                                                                                                Reason for Appointment:  Hyperthyroidism, follow-up visit  Referring physician: Janett Billow Copland   History of Present Illness:   Prior history: Patient is somewhat unclear about what symptoms she is having She apparently wanted her PCP to check her thyroid levels because of family history of thyroid disease  She was having symptoms of palpitations, feeling warmer than usual, some increase in fatigue and shortness of breath as well as mild shakiness She thinks she may have lost 5 pounds since summer of 2017 She also  had increased frequency of bowel movements, 3-4 per day, previously would have mild constipation  Recent history:  She was started on methimazole 10 mg twice a day in 11/17 Since she continued to have Hyperthyroidism leave and 30 mg of methimazole and had persistently high thyrotropin receptor antibody she  had radioactive iodine treatment with 17.8 mCi on 10/22/2016  She does feel better with her energy level compared to last month; she was told to take methimazole for another 2 weeks Occasionally feels cold but not excessively Has gained back 5 pounds and is concerned about her weight gain Main complaint is hair loss since her I-131 treatment which is continuing  Her thyroid levels are back to normal now  Wt Readings from Last 3 Encounters:  12/17/16 126 lb (57.2 kg)  11/05/16 121 lb (54.9 kg)  09/03/16 126 lb 12.8 oz (57.5 kg)     Her thyroid levels are as follows:    Lab Results  Component Value Date   FREET4 0.81 12/15/2016   FREET4 2.27 (H) 10/29/2016   FREET4 1.98 (H) 09/02/2016   T3FREE 3.7 12/15/2016   T3FREE 5.7 (H) 10/29/2016   T3FREE 6.8 (H) 09/02/2016   TSH 0.00 Repeated and verified X2. (L) 12/15/2016   TSH 0.21 (L) 09/02/2016   TSH 0.01 (L) 06/25/2016     Lab Results  Component Value Date   THYROTRECAB 15.97 (H) 09/02/2016     Allergies as of 12/17/2016   No Known Allergies     Medication List       Accurate as of 12/17/16 11:12 AM. Always use your most recent med list.          entecavir 1 MG tablet Commonly known as:  BARACLUDE Take 1 mg by mouth daily.   ONE DAILY MULTIVITAMIN WOMEN PO Take 1 tablet by mouth as needed.   triamcinolone cream 0.1 % Commonly known as:  KENALOG Apply 1 application topically 2 (two) times daily.   warfarin 4 MG tablet Commonly known as:  COUMADIN Take 1/2 to 1 tablet by mouth daily as directed by coumadin clinic           Past Medical History:  Diagnosis Date  . Femur fracture, left ([place]) 1990   with  open reduction and internal fixation   . H/O: rheumatic fever   . Hepatitis B carrier ([place])    positive hepatitis B carrier; maintained on Baraclude; followed by Hepatitis clinic.  Marland Kitchen History of allergic rhinitis   . Severe aortic insufficiency 09/01/2004   s/p aortic valve replacement.      Past Surgical History:  Procedure Laterality Date  . AORTIC VALVE REPLACEMENT  4/06   60mm On-X mechanical valve  . CARDIAC CATHETERIZATION  11/15/2004   Ejection fraction is estimated 55%  . HEMORRHOID SURGERY      Family History  Problem Relation Age of Onset  . Heart disease Brother     valvular dysfunction; s/p replacement  . Hypertension Mother   . Hyperlipidemia Mother   . Hypothyroidism Mother   . Heart disease Father   . Hypertension Father   . Hyperlipidemia Father     Social History:  reports that she has never smoked. She has never used smokeless tobacco. She reports that she does not drink alcohol or use drugs.  Allergies: No Known Allergies    Review of Systems    Examination:   BP 104/70   Pulse 85   Ht 5\' 5"  (1.651 m)   Wt 126 lb (57.2 kg)   LMP  (Approximate)   BMI 20.97 kg/m    Thyroid is nonpalpable Biceps reflexes are normal      Assessment/Plan:   Hyperthyroidism, from Graves' disease   She has now become euthyroid nearly 2 months after her I-131 treatment Her weight has come up She is having continued hair loss which is not unusual given her fluctuating thyroid levels  Does not have any thyroid enlargement She looks euthyroid She will be seen in one month again and discussed possibility of her being hypothyroid by then and needing thyroxine replacement   Shaquana Buel 12/17/2016, 11:12 AM

## 2016-12-28 NOTE — Progress Notes (Signed)
[name] Date of Birth: [birth year] Medical Record #[id number]  History of Present Illness: [name] is seen today for followup. She has a history of severe aortic insufficiency and underwent aortic valve replacement with a mechanical prosthesis in April of 2006. This was a #21 mm On-X valve. She does have some chronic mild dyspnea. She's had extensive pulmonary and cardiac evaluation for this which has been unremarkable. She is on chronic anticoagulation. She does have a history of hepatitis B.  Since her last visit she was diagnosed with hyperthyroidism due to Graves disease. She was on methimazole and subsequent had radioactive iodine thyroid ablation. She has since come off methimazole and last thyroid levels were normal.  On follow up today she is doing well.  She is walking daily. She denies any chest pain or palpitations. Energy level is good. She had lost some weigh but has gained it back.  Current Outpatient Prescriptions on File Prior to Visit  Medication Sig Dispense Refill  . entecavir (BARACLUDE) 1 MG tablet Take 1 mg by mouth daily.    . Multiple Vitamins-Minerals (ONE DAILY MULTIVITAMIN WOMEN PO) Take 1 tablet by mouth as needed.     . warfarin (COUMADIN) 4 MG tablet Take 1/2 to 1 tablet by mouth daily as directed by coumadin clinic 90 tablet 1  . triamcinolone cream (KENALOG) 0.1 % Apply 1 application topically 2 (two) times daily. (Patient not taking: Reported on 12/31/2016) 30 g 0   No current facility-administered medications on file prior to visit.     No Known Allergies  Past Medical History:  Diagnosis Date  . Femur fracture, left ([place]) 1990   with open reduction and internal fixation   . H/O: rheumatic fever   . Hepatitis B carrier ([place])    positive hepatitis B carrier; maintained on Baraclude; followed by Hepatitis clinic.  Marland Kitchen History of allergic rhinitis   . Severe aortic insufficiency 09/01/2004   s/p aortic valve replacement.      Past Surgical History:    Procedure Laterality Date  . AORTIC VALVE REPLACEMENT  4/06   49mm On-X mechanical valve  . CARDIAC CATHETERIZATION  11/15/2004   Ejection fraction is estimated 55%  . HEMORRHOID SURGERY      History  Smoking Status  . Never Smoker  Smokeless Tobacco  . Never Used    History  Alcohol Use No    Family History  Problem Relation Age of Onset  . Heart disease Brother     valvular dysfunction; s/p replacement  . Hypertension Mother   . Hyperlipidemia Mother   . Hypothyroidism Mother   . Heart disease Father   . Hypertension Father   . Hyperlipidemia Father     Review of Systems: As noted in history of present illness.  All other systems were reviewed and are negative.  Physical Exam: BP 110/72   Pulse 72   Ht 5\' 4"  (1.626 m)   Wt 129 lb 4 oz (58.6 kg)   BMI 22.19 kg/m  She is a pleasant, Asian female in no acute distress. HEENT exam is unremarkable. She is no JVD or bruits. Lungs are clear. Cardiac exam reveals a regular rate and rhythm without gallop. She has a good mechanical aortic valve click. Abdomen is soft and nontender. She has no significant edema. Skin is warm and dry. She is alert and oriented x3. Cranial nerves II through XII are intact.  LABORATORY DATA:  Lab Results  Component Value Date   WBC 5.2 06/25/2016  HGB 13.3 06/25/2016   HCT 40.3 06/25/2016   PLT 273 06/25/2016   GLUCOSE 87 06/25/2016   CHOL 125 06/25/2016   TRIG 51 06/25/2016   HDL 49 06/25/2016   LDLCALC 66 06/25/2016   ALT 24 06/25/2016   AST 26 06/25/2016   NA 136 06/25/2016   K 4.2 06/25/2016   CL 107 06/25/2016   CREATININE 0.40 (L) 06/25/2016   BUN 14 06/25/2016   CO2 23 06/25/2016   TSH 0.00 Repeated and verified X2. (L) 12/15/2016   INR 1.2 12/31/2016   HGBA1C 5.6 06/13/2015      INR today is 1.2  Echo 12/12/14:Study Conclusions  - Left ventricle: The cavity size was normal. Wall thickness was normal. Systolic function was normal. The estimated  ejection fraction was in the range of 55% to 60%. Left ventricular diastolic function parameters were normal. - Aortic valve: There was a mechanical aortic valve. No significant stenosis. There was trivial regurgitation. Mean gradient (S): 11 mm Hg. - Mitral valve: There was mild regurgitation. - Right ventricle: The cavity size was normal. Systolic function was normal. - Tricuspid valve: Peak RV-RA gradient (S): 22 mm Hg. - Pulmonary arteries: PA peak pressure: 25 mm Hg (S). - Inferior vena cava: The vessel was normal in size. The respirophasic diameter changes were in the normal range (= 50%), consistent with normal central venous pressure.  Impressions:  - Normal LV size with EF 55-60%. Normal diastolic function.c Normally functioning mechanical aortic valve. Mild MR. Normal RV size and systolic function.   Assessment / Plan: 1. Status post mechanical aortic valve replacement for severe aortic insufficiency. Exam is stable. Echo in April 2016 shoed good valve function. Continue anticoagulation and routine SBE prophylaxis.  Recent INRs have been somewhat labile- possibly related to fluctuating thyroid levels. Adjust per pharmacy. Follow up in 6 months.  2.  Hepatitis B.  3. Graves disease s/p radioactive thyroid ablation. Recent thyroid levels normal. Followed by Dr. Dwyane Dee.

## 2016-12-31 ENCOUNTER — Ambulatory Visit (INDEPENDENT_AMBULATORY_CARE_PROVIDER_SITE_OTHER): Payer: BLUE CROSS/BLUE SHIELD | Admitting: Cardiology

## 2016-12-31 ENCOUNTER — Encounter: Payer: Self-pay | Admitting: Cardiology

## 2016-12-31 ENCOUNTER — Ambulatory Visit (INDEPENDENT_AMBULATORY_CARE_PROVIDER_SITE_OTHER): Payer: BLUE CROSS/BLUE SHIELD | Admitting: Pharmacist

## 2016-12-31 VITALS — BP 110/72 | HR 72 | Ht 64.0 in | Wt 129.2 lb

## 2016-12-31 DIAGNOSIS — I359 Nonrheumatic aortic valve disorder, unspecified: Secondary | ICD-10-CM

## 2016-12-31 DIAGNOSIS — Z7901 Long term (current) use of anticoagulants: Secondary | ICD-10-CM

## 2016-12-31 DIAGNOSIS — Z952 Presence of prosthetic heart valve: Secondary | ICD-10-CM | POA: Diagnosis not present

## 2016-12-31 LAB — POCT INR: INR: 1.2

## 2016-12-31 NOTE — Patient Instructions (Signed)
Continue your current therapy  I will see you in 6 months.   

## 2017-01-05 ENCOUNTER — Ambulatory Visit (INDEPENDENT_AMBULATORY_CARE_PROVIDER_SITE_OTHER): Payer: BLUE CROSS/BLUE SHIELD | Admitting: Pharmacist Clinician (PhC)/ Clinical Pharmacy Specialist

## 2017-01-05 DIAGNOSIS — Z7901 Long term (current) use of anticoagulants: Secondary | ICD-10-CM | POA: Diagnosis not present

## 2017-01-05 DIAGNOSIS — I359 Nonrheumatic aortic valve disorder, unspecified: Secondary | ICD-10-CM | POA: Diagnosis not present

## 2017-01-05 DIAGNOSIS — Z952 Presence of prosthetic heart valve: Secondary | ICD-10-CM | POA: Diagnosis not present

## 2017-01-05 LAB — POCT INR: INR: 1.2

## 2017-01-15 ENCOUNTER — Ambulatory Visit (INDEPENDENT_AMBULATORY_CARE_PROVIDER_SITE_OTHER): Payer: BLUE CROSS/BLUE SHIELD | Admitting: Pharmacist

## 2017-01-15 DIAGNOSIS — I359 Nonrheumatic aortic valve disorder, unspecified: Secondary | ICD-10-CM | POA: Diagnosis not present

## 2017-01-15 DIAGNOSIS — Z7901 Long term (current) use of anticoagulants: Secondary | ICD-10-CM | POA: Diagnosis not present

## 2017-01-15 DIAGNOSIS — Z952 Presence of prosthetic heart valve: Secondary | ICD-10-CM

## 2017-01-15 LAB — POCT INR: INR: 1.4

## 2017-01-20 ENCOUNTER — Ambulatory Visit (INDEPENDENT_AMBULATORY_CARE_PROVIDER_SITE_OTHER): Payer: BLUE CROSS/BLUE SHIELD | Admitting: Pharmacist Clinician (PhC)/ Clinical Pharmacy Specialist

## 2017-01-20 DIAGNOSIS — Z7901 Long term (current) use of anticoagulants: Secondary | ICD-10-CM | POA: Diagnosis not present

## 2017-01-20 DIAGNOSIS — I359 Nonrheumatic aortic valve disorder, unspecified: Secondary | ICD-10-CM

## 2017-01-20 DIAGNOSIS — Z952 Presence of prosthetic heart valve: Secondary | ICD-10-CM | POA: Diagnosis not present

## 2017-01-20 LAB — POCT INR: INR: 1.5

## 2017-01-28 ENCOUNTER — Ambulatory Visit (INDEPENDENT_AMBULATORY_CARE_PROVIDER_SITE_OTHER): Payer: BLUE CROSS/BLUE SHIELD | Admitting: Pharmacist

## 2017-01-28 DIAGNOSIS — I359 Nonrheumatic aortic valve disorder, unspecified: Secondary | ICD-10-CM | POA: Diagnosis not present

## 2017-01-28 DIAGNOSIS — Z7901 Long term (current) use of anticoagulants: Secondary | ICD-10-CM

## 2017-01-28 DIAGNOSIS — Z952 Presence of prosthetic heart valve: Secondary | ICD-10-CM | POA: Diagnosis not present

## 2017-01-28 LAB — POCT INR: INR: 1.8

## 2017-01-29 ENCOUNTER — Other Ambulatory Visit (INDEPENDENT_AMBULATORY_CARE_PROVIDER_SITE_OTHER): Payer: BLUE CROSS/BLUE SHIELD

## 2017-01-29 DIAGNOSIS — E0590 Thyrotoxicosis, unspecified without thyrotoxic crisis or storm: Secondary | ICD-10-CM

## 2017-01-29 LAB — TSH: TSH: 79.23 u[IU]/mL — ABNORMAL HIGH (ref 0.35–4.50)

## 2017-01-29 LAB — T4, FREE: Free T4: 0.15 ng/dL — ABNORMAL LOW (ref 0.60–1.60)

## 2017-02-03 ENCOUNTER — Ambulatory Visit: Payer: BLUE CROSS/BLUE SHIELD | Admitting: Endocrinology

## 2017-02-04 ENCOUNTER — Ambulatory Visit (INDEPENDENT_AMBULATORY_CARE_PROVIDER_SITE_OTHER): Payer: BLUE CROSS/BLUE SHIELD | Admitting: Endocrinology

## 2017-02-04 ENCOUNTER — Ambulatory Visit (INDEPENDENT_AMBULATORY_CARE_PROVIDER_SITE_OTHER): Payer: BLUE CROSS/BLUE SHIELD | Admitting: Pharmacist Clinician (PhC)/ Clinical Pharmacy Specialist

## 2017-02-04 ENCOUNTER — Encounter: Payer: Self-pay | Admitting: Endocrinology

## 2017-02-04 VITALS — BP 118/64 | HR 81 | Temp 98.6°F | Ht 65.0 in | Wt 137.0 lb

## 2017-02-04 DIAGNOSIS — E890 Postprocedural hypothyroidism: Secondary | ICD-10-CM | POA: Diagnosis not present

## 2017-02-04 DIAGNOSIS — I359 Nonrheumatic aortic valve disorder, unspecified: Secondary | ICD-10-CM

## 2017-02-04 DIAGNOSIS — Z952 Presence of prosthetic heart valve: Secondary | ICD-10-CM | POA: Diagnosis not present

## 2017-02-04 DIAGNOSIS — Z7901 Long term (current) use of anticoagulants: Secondary | ICD-10-CM | POA: Diagnosis not present

## 2017-02-04 LAB — POCT INR: INR: 2.4

## 2017-02-04 MED ORDER — LEVOTHYROXINE SODIUM 88 MCG PO TABS: 88.0000 ug | ORAL_TABLET | Freq: Every day | ORAL | 3 refills | Status: DC

## 2017-02-04 NOTE — Progress Notes (Signed)
Patient ID: [name], female   DOB: 04/19/1971, 46 y.o.   MRN: [id number]                                                                                                                Reason for Appointment:  Follow-up for thyroid    History of Present Illness:   Prior history: She apparently wanted her PCP to check her thyroid levels because of family history of thyroid disease  She was having symptoms of palpitations, feeling warmer than usual, some increase in fatigue and shortness of breath as well as mild shakiness She thinks she may have lost 5 pounds since summer of 2017 She also  had increased frequency of bowel movements, 3-4 per day, previously would have mild constipation  Recent history:  She was started on methimazole 10 mg twice a day in 11/17 Since she continued to have Hyperthyroidism leave and 30 mg of methimazole and had persistently high thyrotropin receptor antibody she  had radioactive iodine treatment with 17.8 mCi on 10/22/2016  Since her last visit she has not had any unusual fatigue She does note that she has gained 8 pounds Also complains of feeling cold significantly impaired.  Before Her hair loss is improved  Wt Readings from Last 3 Encounters:  02/04/17 137 lb (62.1 kg)  12/31/16 129 lb 4 oz (58.6 kg)  12/17/16 126 lb (57.2 kg)     Free T4 level was normal in April and now it is significantly low along with high TSH  Her thyroid levels are as follows:    Lab Results  Component Value Date   FREET4 0.15 (L) 01/29/2017   FREET4 0.81 12/15/2016   FREET4 2.27 (H) 10/29/2016   T3FREE 3.7 12/15/2016   T3FREE 5.7 (H) 10/29/2016   T3FREE 6.8 (H) 09/02/2016   TSH 79.23 Repeated and verified X2. (H) 01/29/2017   TSH 0.00 Repeated and verified X2. (L) 12/15/2016   TSH 0.21 (L) 09/02/2016    Lab Results  Component Value Date   THYROTRECAB 15.97 (H) 09/02/2016     Allergies as of 02/04/2017   No Known Allergies     Medication List         Accurate as of 02/04/17  3:55 PM. Always use your most recent med list.          entecavir 1 MG tablet Commonly known as:  BARACLUDE Take 1 mg by mouth daily.   ONE DAILY MULTIVITAMIN WOMEN PO Take 1 tablet by mouth as needed.   triamcinolone cream 0.1 % Commonly known as:  KENALOG Apply 1 application topically 2 (two) times daily.   warfarin 4 MG tablet Commonly known as:  COUMADIN Take 1/2 to 1 tablet by mouth daily as directed by coumadin clinic           Past Medical History:  Diagnosis Date  . Femur fracture, left ([place]) 1990   with open reduction and internal fixation   . H/O: rheumatic fever   . Hepatitis B carrier (  [place])    positive hepatitis B carrier; maintained on Baraclude; followed by Hepatitis clinic.  Marland Kitchen History of allergic rhinitis   . Severe aortic insufficiency 09/01/2004   s/p aortic valve replacement.      Past Surgical History:  Procedure Laterality Date  . AORTIC VALVE REPLACEMENT  4/06   71mm On-X mechanical valve  . CARDIAC CATHETERIZATION  11/15/2004   Ejection fraction is estimated 55%  . HEMORRHOID SURGERY      Family History  Problem Relation Age of Onset  . Heart disease Brother        valvular dysfunction; s/p replacement  . Hypertension Mother   . Hyperlipidemia Mother   . Hypothyroidism Mother   . Heart disease Father   . Hypertension Father   . Hyperlipidemia Father     Social History:  reports that she has never smoked. She has never used smokeless tobacco. She reports that she does not drink alcohol or use drugs.  Allergies: No Known Allergies    Review of Systems  She has been treated with warfarin since her aortic valve replacement   Examination:   BP 118/64 (BP Location: Left Arm, Patient Position: Sitting, Cuff Size: Normal)   Pulse 81   Temp 98.6 F (37 C)   Ht 5\' 5"  (1.651 m)   Wt 137 lb (62.1 kg)   LMP 01/16/2017 (Approximate)   SpO2 96%   BMI 22.80 kg/m   Repeat pulse 74, regular Her face is mildly  puffy Voice is slightly hoarse Biceps reflexes are Showing slow relaxation No peripheral edema    Assessment/Plan:   HYPOTHYROIDISM, secondary to I-131 ablation for Graves' disease   She has now become significantly Hypothyroid and is symptomatic with weight gain and cold intolerance Free T4 is significantly low as well as TSH is nearly 80 Discussed with the patient that she is now hypothyroid and this is a long-term event Explain to her that she will need to be on daily supplementation with levothyroxine and this is identical to her normal thyroid hormone production  For her weight she will start with 88 g of levothyroxine She will continue to take her multivitamin at lunchtime and discussed not taking it at the same time  She will come back in follow-up in about a month with repeat labs   [facility] 02/04/2017, 3:55 PM

## 2017-02-25 ENCOUNTER — Other Ambulatory Visit: Payer: Self-pay | Admitting: Pharmacist Clinician (PhC)/ Clinical Pharmacy Specialist

## 2017-02-25 ENCOUNTER — Ambulatory Visit (INDEPENDENT_AMBULATORY_CARE_PROVIDER_SITE_OTHER): Payer: BLUE CROSS/BLUE SHIELD | Admitting: Pharmacist Clinician (PhC)/ Clinical Pharmacy Specialist

## 2017-02-25 DIAGNOSIS — I359 Nonrheumatic aortic valve disorder, unspecified: Secondary | ICD-10-CM | POA: Diagnosis not present

## 2017-02-25 DIAGNOSIS — Z7901 Long term (current) use of anticoagulants: Secondary | ICD-10-CM | POA: Diagnosis not present

## 2017-02-25 DIAGNOSIS — Z952 Presence of prosthetic heart valve: Secondary | ICD-10-CM | POA: Diagnosis not present

## 2017-02-25 LAB — POCT INR: INR: 3

## 2017-02-25 MED ORDER — WARFARIN SODIUM 5 MG PO TABS: ORAL_TABLET | ORAL | 1 refills | Status: DC

## 2017-03-16 ENCOUNTER — Ambulatory Visit (INDEPENDENT_AMBULATORY_CARE_PROVIDER_SITE_OTHER): Payer: BLUE CROSS/BLUE SHIELD | Admitting: Pharmacist Clinician (PhC)/ Clinical Pharmacy Specialist

## 2017-03-16 DIAGNOSIS — Z952 Presence of prosthetic heart valve: Secondary | ICD-10-CM

## 2017-03-16 DIAGNOSIS — I359 Nonrheumatic aortic valve disorder, unspecified: Secondary | ICD-10-CM

## 2017-03-16 DIAGNOSIS — Z7901 Long term (current) use of anticoagulants: Secondary | ICD-10-CM

## 2017-03-16 LAB — POCT INR: INR: 4.4

## 2017-03-20 ENCOUNTER — Other Ambulatory Visit (INDEPENDENT_AMBULATORY_CARE_PROVIDER_SITE_OTHER): Payer: BLUE CROSS/BLUE SHIELD

## 2017-03-20 DIAGNOSIS — E890 Postprocedural hypothyroidism: Secondary | ICD-10-CM | POA: Diagnosis not present

## 2017-03-20 LAB — T4, FREE: FREE T4: 1.21 ng/dL (ref 0.60–1.60)

## 2017-03-20 LAB — TSH: TSH: 2.02 u[IU]/mL (ref 0.35–4.50)

## 2017-03-25 ENCOUNTER — Ambulatory Visit (INDEPENDENT_AMBULATORY_CARE_PROVIDER_SITE_OTHER): Payer: BLUE CROSS/BLUE SHIELD | Admitting: Endocrinology

## 2017-03-25 ENCOUNTER — Encounter: Payer: Self-pay | Admitting: Endocrinology

## 2017-03-25 VITALS — BP 116/64 | HR 76 | Ht 65.0 in | Wt 133.0 lb

## 2017-03-25 DIAGNOSIS — E890 Postprocedural hypothyroidism: Secondary | ICD-10-CM | POA: Diagnosis not present

## 2017-03-25 NOTE — Progress Notes (Signed)
Patient ID: [name], female   DOB: December 05, 1970, 46 y.o.   MRN: [id number]                                                                                                                Reason for Appointment:  Follow-up for thyroid    History of Present Illness:   Prior history: She apparently wanted her PCP to check her thyroid levels because of family history of thyroid disease  She was having symptoms of palpitations, feeling warmer than usual, some increase in fatigue and shortness of breath as well as mild shakiness She thinks she may have lost 5 pounds since summer of 2017 She also  had increased frequency of bowel movements, 3-4 per day, previously would have mild constipation She was started on methimazole 10 mg twice a day in 11/17  Recent history:   Since she continued to have Hyperthyroidism even with 30 mg of methimazole and had persistently high thyrotropin receptor antibody she had radioactive iodine treatment with 17.8 mCi on 10/22/2016  On her follow-up in 6/18 she had gained 8 pounds, was complaining about feeling excessively cold although did not think her energy level is less She was significantly hypothyroid and started on 88 g of levothyroxine  Since then she is not having any new complaints except for more hair loss Her weight is down slightly Still complains of feeling cold She is also asking about a momentary feeling of dizziness even when she is sitting down No unusual fatigue  Wt Readings from Last 3 Encounters:  03/25/17 133 lb (60.3 kg)  02/04/17 137 lb (62.1 kg)  12/31/16 129 lb 4 oz (58.6 kg)      Her thyroid levels are as follows:    Lab Results  Component Value Date   FREET4 1.21 03/20/2017   FREET4 0.15 (L) 01/29/2017   FREET4 0.81 12/15/2016   T3FREE 3.7 12/15/2016   T3FREE 5.7 (H) 10/29/2016   T3FREE 6.8 (H) 09/02/2016   TSH 2.02 03/20/2017   TSH 79.23 Repeated and verified X2. (H) 01/29/2017   TSH 0.00 Repeated and verified X2.  (L) 12/15/2016    Lab Results  Component Value Date   THYROTRECAB 15.97 (H) 09/02/2016     Allergies as of 03/25/2017   No Known Allergies     Medication List       Accurate as of 03/25/17 10:56 AM. Always use your most recent med list.          cholecalciferol 1000 units tablet Commonly known as:  VITAMIN D Take 1,000 Units by mouth daily.   entecavir 1 MG tablet Commonly known as:  BARACLUDE Take 1 mg by mouth daily.   levothyroxine 88 MCG tablet Commonly known as:  SYNTHROID, LEVOTHROID Take 1 tablet (88 mcg total) by mouth daily.   ONE DAILY MULTIVITAMIN WOMEN PO Take 1 tablet by mouth as needed.   triamcinolone cream 0.1 % Commonly known as:  KENALOG Apply 1 application topically 2 (two) times daily.   warfarin  5 MG tablet Commonly known as:  COUMADIN Take 1.5 tablets by mouth daily or as directed by coumadin clinic           Past Medical History:  Diagnosis Date  . Femur fracture, left ([place]) 1990   with open reduction and internal fixation   . H/O: rheumatic fever   . Hepatitis B carrier ([place])    positive hepatitis B carrier; maintained on Baraclude; followed by Hepatitis clinic.  Marland Kitchen History of allergic rhinitis   . Severe aortic insufficiency 09/01/2004   s/p aortic valve replacement.      Past Surgical History:  Procedure Laterality Date  . AORTIC VALVE REPLACEMENT  4/06   72mm On-X mechanical valve  . CARDIAC CATHETERIZATION  11/15/2004   Ejection fraction is estimated 55%  . HEMORRHOID SURGERY      Family History  Problem Relation Age of Onset  . Heart disease Brother        valvular dysfunction; s/p replacement  . Hypertension Mother   . Hyperlipidemia Mother   . Hypothyroidism Mother   . Heart disease Father   . Hypertension Father   . Hyperlipidemia Father     Social History:  reports that she has never smoked. She has never used smokeless tobacco. She reports that she does not drink alcohol or use drugs.  Allergies: No Known  Allergies    Review of Systems  She has been treated with warfarin since her aortic valve replacement   Examination:   BP 116/64   Pulse 76   Ht 5\' 5"  (1.651 m)   Wt 133 lb (60.3 kg)   SpO2 98%   BMI 22.13 kg/m   Her face is mildly puffy Neck exam shows no thyroid enlargement Biceps reflexes are normal Skin does not appear unusually dry No peripheral edema    Assessment/Plan:   HYPOTHYROIDISM, secondary to I-131 ablation for Graves' disease   Although her thyroid levels are back to normal with supplementation using 88 g of levothyroxine she still is complaining about cold intolerance, nonspecific dizziness and hair loss Objectively looks euthyroid Most likely her hair loss will improve over time since she had significant change in her thyroid levels recently  Today discussed that she will need to be on thyroid supplementation lifelong Discussed that she does not have a functioning thyroid gland now Also advised that she can be liberal with her diet and does not need any restrictions  Reminded her to take her thyroid supplement 30 min before breakfast daily  Follow-up in 2 months Total visit time = 15 minutes  Torion Hulgan 03/25/2017, 10:56 AM

## 2017-04-01 ENCOUNTER — Ambulatory Visit (INDEPENDENT_AMBULATORY_CARE_PROVIDER_SITE_OTHER): Payer: BLUE CROSS/BLUE SHIELD | Admitting: Pharmacist

## 2017-04-01 DIAGNOSIS — I359 Nonrheumatic aortic valve disorder, unspecified: Secondary | ICD-10-CM

## 2017-04-01 DIAGNOSIS — Z952 Presence of prosthetic heart valve: Secondary | ICD-10-CM | POA: Diagnosis not present

## 2017-04-01 DIAGNOSIS — Z7901 Long term (current) use of anticoagulants: Secondary | ICD-10-CM | POA: Diagnosis not present

## 2017-04-01 LAB — POCT INR: INR: 2.8

## 2017-04-22 ENCOUNTER — Other Ambulatory Visit: Payer: Self-pay | Admitting: Nurse Practitioner

## 2017-04-22 DIAGNOSIS — B181 Chronic viral hepatitis B without delta-agent: Secondary | ICD-10-CM | POA: Diagnosis not present

## 2017-05-01 ENCOUNTER — Ambulatory Visit (INDEPENDENT_AMBULATORY_CARE_PROVIDER_SITE_OTHER): Payer: BLUE CROSS/BLUE SHIELD | Admitting: Pharmacist

## 2017-05-01 ENCOUNTER — Encounter: Payer: Self-pay | Admitting: Internal Medicine

## 2017-05-01 DIAGNOSIS — Z7901 Long term (current) use of anticoagulants: Secondary | ICD-10-CM | POA: Diagnosis not present

## 2017-05-01 DIAGNOSIS — Z952 Presence of prosthetic heart valve: Secondary | ICD-10-CM

## 2017-05-01 DIAGNOSIS — I359 Nonrheumatic aortic valve disorder, unspecified: Secondary | ICD-10-CM | POA: Diagnosis not present

## 2017-05-01 LAB — POCT INR: INR: 2.8

## 2017-05-01 NOTE — Telephone Encounter (Signed)
Opened in error

## 2017-05-06 ENCOUNTER — Ambulatory Visit
Admission: RE | Admit: 2017-05-06 | Discharge: 2017-05-06 | Disposition: A | Discharge status: Home / Self Care | Payer: BLUE CROSS/BLUE SHIELD | Source: Ambulatory Visit | Attending: Nurse Practitioner | Admitting: Nurse Practitioner

## 2017-05-06 DIAGNOSIS — B1910 Unspecified viral hepatitis B without hepatic coma: Secondary | ICD-10-CM | POA: Diagnosis not present

## 2017-05-06 DIAGNOSIS — B181 Chronic viral hepatitis B without delta-agent: Secondary | ICD-10-CM

## 2017-05-15 ENCOUNTER — Other Ambulatory Visit: Payer: BLUE CROSS/BLUE SHIELD

## 2017-05-15 DIAGNOSIS — Z23 Encounter for immunization: Secondary | ICD-10-CM | POA: Diagnosis not present

## 2017-05-20 ENCOUNTER — Ambulatory Visit: Payer: BLUE CROSS/BLUE SHIELD | Admitting: Endocrinology

## 2017-05-20 DIAGNOSIS — Z01419 Encounter for gynecological examination (general) (routine) without abnormal findings: Secondary | ICD-10-CM | POA: Diagnosis not present

## 2017-05-20 DIAGNOSIS — Z1231 Encounter for screening mammogram for malignant neoplasm of breast: Secondary | ICD-10-CM | POA: Diagnosis not present

## 2017-06-03 ENCOUNTER — Ambulatory Visit (INDEPENDENT_AMBULATORY_CARE_PROVIDER_SITE_OTHER): Payer: BLUE CROSS/BLUE SHIELD | Admitting: Pharmacist

## 2017-06-03 DIAGNOSIS — Z7901 Long term (current) use of anticoagulants: Secondary | ICD-10-CM | POA: Diagnosis not present

## 2017-06-03 DIAGNOSIS — Z952 Presence of prosthetic heart valve: Secondary | ICD-10-CM | POA: Diagnosis not present

## 2017-06-03 DIAGNOSIS — I359 Nonrheumatic aortic valve disorder, unspecified: Secondary | ICD-10-CM

## 2017-06-03 LAB — POCT INR: INR: 2.2

## 2017-06-24 ENCOUNTER — Other Ambulatory Visit (INDEPENDENT_AMBULATORY_CARE_PROVIDER_SITE_OTHER): Payer: BLUE CROSS/BLUE SHIELD

## 2017-06-24 ENCOUNTER — Ambulatory Visit (INDEPENDENT_AMBULATORY_CARE_PROVIDER_SITE_OTHER): Payer: BLUE CROSS/BLUE SHIELD | Admitting: Pharmacist Clinician (PhC)/ Clinical Pharmacy Specialist

## 2017-06-24 DIAGNOSIS — I359 Nonrheumatic aortic valve disorder, unspecified: Secondary | ICD-10-CM | POA: Diagnosis not present

## 2017-06-24 DIAGNOSIS — Z7901 Long term (current) use of anticoagulants: Secondary | ICD-10-CM

## 2017-06-24 DIAGNOSIS — Z952 Presence of prosthetic heart valve: Secondary | ICD-10-CM | POA: Diagnosis not present

## 2017-06-24 DIAGNOSIS — E890 Postprocedural hypothyroidism: Secondary | ICD-10-CM | POA: Diagnosis not present

## 2017-06-24 LAB — POCT INR: INR: 2.3

## 2017-06-24 LAB — TSH: TSH: 0.17 u[IU]/mL — ABNORMAL LOW (ref 0.35–4.50)

## 2017-06-24 LAB — T4, FREE: Free T4: 1.37 ng/dL (ref 0.60–1.60)

## 2017-07-01 ENCOUNTER — Encounter: Payer: Self-pay | Admitting: Endocrinology

## 2017-07-01 ENCOUNTER — Ambulatory Visit (INDEPENDENT_AMBULATORY_CARE_PROVIDER_SITE_OTHER): Payer: BLUE CROSS/BLUE SHIELD | Admitting: Endocrinology

## 2017-07-01 VITALS — BP 112/74 | HR 82 | Ht 63.5 in | Wt 136.0 lb

## 2017-07-01 DIAGNOSIS — E890 Postprocedural hypothyroidism: Secondary | ICD-10-CM | POA: Diagnosis not present

## 2017-07-01 NOTE — Progress Notes (Signed)
Patient ID: [name], female   DOB: 1970/12/17, 46 y.o.   MRN: [id number]                                                                                                                Reason for Appointment:  Follow-up for thyroid    History of Present Illness:   Prior history: She apparently wanted her PCP to check her thyroid levels because of family history of thyroid disease  She was having symptoms of palpitations, feeling warmer than usual, some increase in fatigue and shortness of breath as well as mild shakiness She thinks she may have lost 5 pounds since summer of 2017 She also  had increased frequency of bowel movements, 3-4 per day, previously would have mild constipation She was started on methimazole 10 mg twice a day in 11/17  Recent history:   Since she continued to have Hyperthyroidism even with 30 mg of methimazole and had persistently high thyrotropin receptor antibody she had radioactive iodine treatment with 17.8 mCi on 10/22/2016  On her follow-up in 6/18 she had gained 8 pounds, was complaining about feeling excessively cold although did not think her energy level is less She was significantly hypothyroid and started on 88 g of levothyroxine  The dose was continued in 03/2017 when her TSH was normal at 12  Since then she is not having any new complaints  She feels fairly good with only occasional fatigue Her hair loss is better Not complaining of excessively feeling cold now Her weight has fluctuated a little  Her TSH is now relatively low at 0.17  Wt Readings from Last 3 Encounters:  07/01/17 136 lb (61.7 kg)  03/25/17 133 lb (60.3 kg)  02/04/17 137 lb (62.1 kg)      Her thyroid levels are as follows:    Lab Results  Component Value Date   FREET4 1.37 06/24/2017   FREET4 1.21 03/20/2017   FREET4 0.15 (L) 01/29/2017   T3FREE 3.7 12/15/2016   T3FREE 5.7 (H) 10/29/2016   T3FREE 6.8 (H) 09/02/2016   TSH 0.17 (L) 06/24/2017   TSH 2.02  03/20/2017   TSH 79.23 Repeated and verified X2. (H) 01/29/2017    Lab Results  Component Value Date   THYROTRECAB 15.97 (H) 09/02/2016     Allergies as of 07/01/2017   No Known Allergies     Medication List       Accurate as of 07/01/17 11:05 AM. Always use your most recent med list.          cholecalciferol 1000 units tablet Commonly known as:  VITAMIN D Take 1,000 Units by mouth daily.   entecavir 1 MG tablet Commonly known as:  BARACLUDE Take 1 mg by mouth daily.   levothyroxine 88 MCG tablet Commonly known as:  SYNTHROID, LEVOTHROID Take 1 tablet (88 mcg total) by mouth daily.   ONE DAILY MULTIVITAMIN WOMEN PO Take 1 tablet by mouth as needed.   warfarin 5 MG tablet Commonly known as:  COUMADIN Take  1.5 tablets by mouth daily or as directed by coumadin clinic           Past Medical History:  Diagnosis Date  . Femur fracture, left ([place]) 1990   with open reduction and internal fixation   . H/O: rheumatic fever   . Hepatitis B carrier ([place])    positive hepatitis B carrier; maintained on Baraclude; followed by Hepatitis clinic.  Marland Kitchen History of allergic rhinitis   . Severe aortic insufficiency 09/01/2004   s/p aortic valve replacement.      Past Surgical History:  Procedure Laterality Date  . AORTIC VALVE REPLACEMENT  4/06   11mm On-X mechanical valve  . CARDIAC CATHETERIZATION  11/15/2004   Ejection fraction is estimated 55%  . HEMORRHOID SURGERY      Family History  Problem Relation Age of Onset  . Heart disease Brother        valvular dysfunction; s/p replacement  . Hypertension Mother   . Hyperlipidemia Mother   . Hypothyroidism Mother   . Heart disease Father   . Hypertension Father   . Hyperlipidemia Father     Social History:  reports that she has never smoked. She has never used smokeless tobacco. She reports that she does not drink alcohol or use drugs.  Allergies: No Known Allergies    Review of Systems  She has been treated  with warfarin since her aortic valve replacement  She had a menstrual cycle recently, this has  been irregular.  She thinks her true age is close to 24  Asking about some constipation   Examination:   BP 112/74   Pulse 82   Ht 5' 3.5" (1.613 m)   Wt 136 lb (61.7 kg)   SpO2 98%   BMI 23.71 kg/m   No swelling of eyes or face  Biceps reflexes are normal Skin appears normal    Assessment/Plan:   HYPOTHYROIDISM, secondary to I-131 ablation for Graves' disease   She is on supplementation with levothyroxine since 01/2017 Although her dose was appropriate when she was seen after initially starting 88 g her TSH is now relatively low Clinically she feels fairly good Her hair loss is improving Discussed that her other nonspecific symptoms are unrelated to thyroid disease For now we will go down to 75 g on her levothyroxine She will take this before breakfast as before    Follow-up in 2 months Total visit time = 15 minutes  Annette Bertelson 07/01/2017, 11:05 AM

## 2017-07-02 ENCOUNTER — Other Ambulatory Visit: Payer: Self-pay

## 2017-07-02 ENCOUNTER — Telehealth: Payer: Self-pay | Admitting: Endocrinology

## 2017-07-02 MED ORDER — LEVOTHYROXINE SODIUM 75 MCG PO TABS: 75.0000 ug | ORAL_TABLET | Freq: Every day | ORAL | 0 refills | Status: DC

## 2017-07-02 NOTE — Telephone Encounter (Signed)
Patient called [facility] stated pharmacy haven't received her medication. Didn't state what medication

## 2017-07-02 NOTE — Telephone Encounter (Signed)
[place] and spoke to a lady that stated her prescription for the levothyroxine 75 mcg was received and will be ready in an hour.  I called patient and let her know and she thanked me.

## 2017-07-13 ENCOUNTER — Encounter: Payer: BLUE CROSS/BLUE SHIELD | Admitting: Family Medicine

## 2017-07-15 ENCOUNTER — Ambulatory Visit (INDEPENDENT_AMBULATORY_CARE_PROVIDER_SITE_OTHER): Payer: BLUE CROSS/BLUE SHIELD | Admitting: Pharmacist

## 2017-07-15 DIAGNOSIS — Z952 Presence of prosthetic heart valve: Secondary | ICD-10-CM | POA: Diagnosis not present

## 2017-07-15 DIAGNOSIS — Z7901 Long term (current) use of anticoagulants: Secondary | ICD-10-CM

## 2017-07-15 DIAGNOSIS — I359 Nonrheumatic aortic valve disorder, unspecified: Secondary | ICD-10-CM | POA: Diagnosis not present

## 2017-07-15 LAB — POCT INR: INR: 3.1

## 2017-07-27 ENCOUNTER — Other Ambulatory Visit: Payer: Self-pay | Admitting: Endocrinology

## 2017-07-29 ENCOUNTER — Ambulatory Visit: Payer: BLUE CROSS/BLUE SHIELD | Admitting: Family Medicine

## 2017-08-02 NOTE — Progress Notes (Addendum)
[place] at [facility] [address], [place], [place] [address] [phone]  Date:  08/05/2017   Name:  [name]   DOB:  11/02/1965   MRN:  [id number]  PCP:  Darreld Mclean, MD    Chief Complaint: Annual Exam (Pt here for CPE. )   History of Present Illness:  [name] is a 46 y.o. very pleasant female patient who presents with the following:  CPE today History of hypothyroidism, hep B, aortic valve disorder on anticoagulation therapy  Pap: October per her gyn.   In general she still has menses every month Mammo: October per gyn Flu: September  Tetanus: due today- will update for her today Labs: a year ago except for regular INR checks  She is NOT fasting today  I last saw her about 2 years ago, as follows:  Here today for a CPE but no pap needed She is on chronic anticoagulation for an aortic valve issue- had replacement with mechanical valve in 2006.  She does have hep B followed by [facility]- however it seems that they have referred her to a clinic in [facility] for some reason.  Syncopal episode this spring- subsequent eval was ok.   Cardiologist is Dr. [place]ique, her INR is followed by cardiology coumadin clinic.   She sees OB/GYN with [place] and is up to date on her mammogram and pap She is fasting this morning She notes issues with insomnia over the years- this will come and go with intermittent worsening.  She has had a hard time especially the last 3 days, feels that she cannot get her mind to stop working  She has tried melatonin, benadryl- however notes that benadryl leaves her tired in the am, melatonin does not really work,  her sx seem to come back again, wonders if there is a medication that can cure her insomnia permanently?  She is a non- smoker, non- drinker.  Periods are regular, LMP 10/10. She does not plan any future pregnancy Liver function tests from 08/2014 normal  Pt notes that  she has been well since the last time we saw her except she had developed hyperthyroidism due to Graves disease, then was treated with RAI and is now hypothyroid- being treated with thyroid replacement Her endocrinology is Dr. Dwyane Dee  She notes that she had hemorrhoid surgery several years ago She notes that sometimes she has to strain to pass a BM, and can have some blood.  She has noted this for the last couple of months- wonders if might be related to her thryoid issues  Her DOB is listed as Nov 24, 1970- however pt notes that this is not correct, and that her DOB was changed when her family fled from war.   Pt notes that she is actually turning 53 coming up-Her DOB is 11/02/1965.  Will correct this in her chart  Patient Active Problem List   Diagnosis Date Noted  . Hypothyroidism, postradioiodine therapy 02/04/2017  . Vasovagal syncope 05/02/2015  . Hepatitis B 04/28/2012  . Aortic valve disorder 02/11/2012  . Long term (current) use of anticoagulants 02/11/2012  . S/P AVR 12/16/2011  . Chronic anticoagulation 12/16/2011  . PULMONARY NODULE 01/31/2010  . DYSPNEA 01/31/2010    Past Medical History:  Diagnosis Date  . Femur fracture, left ([place]) 1990   with open reduction and internal fixation   . H/O: rheumatic fever   . Hepatitis B carrier ([place])  positive hepatitis B carrier; maintained on Baraclude; followed by Hepatitis clinic.  Marland Kitchen History of allergic rhinitis   . Severe aortic insufficiency 09/01/2004   s/p aortic valve replacement.      Past Surgical History:  Procedure Laterality Date  . AORTIC VALVE REPLACEMENT  4/06   15mm On-X mechanical valve  . CARDIAC CATHETERIZATION  11/15/2004   Ejection fraction is estimated 55%  . HEMORRHOID SURGERY      Social History   Tobacco Use  . Smoking status: Never Smoker  . Smokeless tobacco: Never Used  Substance Use Topics  . Alcohol use: No    Alcohol/week: 0.0 oz  . Drug use: No    Family History  Problem Relation Age of  Onset  . Heart disease Brother        valvular dysfunction; s/p replacement  . Hypertension Mother   . Hyperlipidemia Mother   . Hypothyroidism Mother   . Heart disease Father   . Hypertension Father   . Hyperlipidemia Father     No Known Allergies  Medication list has been reviewed and updated.  Current Outpatient Medications on File Prior to Visit  Medication Sig Dispense Refill  . BIOTIN PO Take 0.5 tablet by mouth as needed    . cholecalciferol (VITAMIN D) 1000 units tablet Take 1,000 Units by mouth daily.    . COLLAGEN PO Take 2 capsules by mouth daily.    Marland Kitchen entecavir (BARACLUDE) 1 MG tablet Take 1 mg by mouth daily.    Marland Kitchen levothyroxine (SYNTHROID, LEVOTHROID) 75 MCG tablet TAKE 1 TABLET BY MOUTH ONCE DAILY BEFORE BREAKFAST 30 tablet 0  . Multiple Vitamins-Minerals (ONE DAILY MULTIVITAMIN WOMEN PO) Take 1 tablet by mouth as needed.     . warfarin (COUMADIN) 5 MG tablet Take 1.5 tablets by mouth daily or as directed by coumadin clinic 150 tablet 1   No current facility-administered medications on file prior to visit.     Review of Systems:  As per HPI- otherwise negative. BP Readings from Last 3 Encounters:  08/05/17 102/70  07/01/17 112/74  03/25/17 116/64     Physical Examination: Vitals:   08/05/17 1032  BP: 102/70  Pulse: 75  Temp: 98.2 F (36.8 C)  SpO2: 98%   Vitals:   08/05/17 1032  Weight: 136 lb (61.7 kg)  Height: 5' 3.5" (1.613 m)   Body mass index is 23.71 kg/m. Ideal Body Weight: Weight in (lb) to have BMI = 25: 143.1  GEN: WDWN, NAD, Non-toxic, A & O x 3 HEENT: Atraumatic, Normocephalic. Neck supple. No masses, No LAD. Ears and Nose: No external deformity. CV: RRR, No M/G/R. Louder heart sound due to mechanical valve. No JVD. No thrill. No extra heart sounds. PULM: CTA B, no wheezes, crackles, rhonchi. No retractions. No resp. distress. No accessory muscle use. ABD: S, NT, ND, +BS. No rebound. No HSM. EXTR: No c/c/e NEURO Normal gait.   PSYCH: Normally interactive. Conversant. Not depressed or anxious appearing.  Calm demeanor.    Assessment and Plan: Annual physical exam  Screening, anemia, deficiency, iron - Plan: CBC  Screening, lipid - Plan: Lipid panel  Acquired hypothyroidism - Plan: TSH  Screening for diabetes mellitus - Plan: Comprehensive metabolic panel, Hemoglobin A1c  Immunization due - Plan: Td vaccine greater than or equal to 7yo preservative free IM  Screening for colon cancer - Plan: Ambulatory referral to Gastroenterology  CPE today She is overall doing well She does have complaint of mild dyspnea with intense exercise (like  running), but this is stable and has been evaluated carefully per her last cardiology note, 12/31/16.  No CP  She is actually turning 52 this year so I will refer her to GI for screening colonoscopy. She has noted some hard stools and bleeding associated with straining to pass BM- may be due to her thyroid. Will check a TSH while we are running labs today.  Updated he DOB to reflect her true age Recommend OTC stool softener She wonders about a PPI for her GERD- advised that an h2 blocker would be better since she is on coumadin. She is taking zantac OTC right now and it does help, she just was not sure if something else would be better.  She has been taking this and her INR has been in range- advised that she can continue her current dose in this case  See patient instructions for more details.   Lab Results  Component Value Date   INR 3.1 07/15/2017   INR 2.3 06/24/2017   INR 2.2 06/03/2017     Signed Lamar Blinks, MD  Received her labs 12/6  Results for orders placed or performed in visit on 08/05/17  CBC  Result Value Ref Range   WBC 3.7 (L) 4.0 - 10.5 K/uL   RBC 4.42 3.87 - 5.11 Mil/uL   Platelets 279.0 150.0 - 400.0 K/uL   Hemoglobin 13.0 12.0 - 15.0 g/dL   HCT 40.0 36.0 - 46.0 %   MCV 90.5 78.0 - 100.0 fl   MCHC 32.6 30.0 - 36.0 g/dL   RDW 13.8 11.5 -  15.5 %  Comprehensive metabolic panel  Result Value Ref Range   Sodium 138 135 - 145 mEq/L   Potassium 4.1 3.5 - 5.1 mEq/L   Chloride 104 96 - 112 mEq/L   CO2 29 19 - 32 mEq/L   Glucose, Bld 94 70 - 99 mg/dL   BUN 14 6 - 23 mg/dL   Creatinine, Ser 0.61 0.40 - 1.20 mg/dL   Total Bilirubin 0.7 0.2 - 1.2 mg/dL   Alkaline Phosphatase 47 39 - 117 U/L   AST 19 0 - 37 U/L   ALT 14 0 - 35 U/L   Total Protein 8.0 6.0 - 8.3 g/dL   Albumin 4.3 3.5 - 5.2 g/dL   Calcium 9.4 8.4 - 10.5 mg/dL   GFR 110.45 >60.00 mL/min  Hemoglobin A1c  Result Value Ref Range   Hgb A1c MFr Bld 5.4 4.6 - 6.5 %  Lipid panel  Result Value Ref Range   Cholesterol 193 0 - 200 mg/dL   Triglycerides 81.0 0.0 - 149.0 mg/dL   HDL 58.70 >39.00 mg/dL   VLDL 16.2 0.0 - 40.0 mg/dL   LDL Cholesterol 119 (H) 0 - 99 mg/dL   Total CHOL/HDL Ratio 3    NonHDL 134.77   TSH  Result Value Ref Range   TSH 0.60 0.35 - 4.50 uIU/mL   Letter to pt

## 2017-08-05 ENCOUNTER — Ambulatory Visit (INDEPENDENT_AMBULATORY_CARE_PROVIDER_SITE_OTHER): Payer: BLUE CROSS/BLUE SHIELD | Admitting: Family Medicine

## 2017-08-05 ENCOUNTER — Encounter: Payer: Self-pay | Admitting: Family Medicine

## 2017-08-05 VITALS — BP 102/70 | HR 75 | Temp 98.2°F | Ht 63.5 in | Wt 136.0 lb

## 2017-08-05 DIAGNOSIS — Z130 Encounter for screening for diseases of the blood and blood-forming organs and certain disorders involving the immune mechanism: Secondary | ICD-10-CM

## 2017-08-05 DIAGNOSIS — Z1211 Encounter for screening for malignant neoplasm of colon: Secondary | ICD-10-CM | POA: Diagnosis not present

## 2017-08-05 DIAGNOSIS — Z131 Encounter for screening for diabetes mellitus: Secondary | ICD-10-CM | POA: Diagnosis not present

## 2017-08-05 DIAGNOSIS — Z23 Encounter for immunization: Secondary | ICD-10-CM

## 2017-08-05 DIAGNOSIS — Z13220 Encounter for screening for lipoid disorders: Secondary | ICD-10-CM

## 2017-08-05 DIAGNOSIS — E039 Hypothyroidism, unspecified: Secondary | ICD-10-CM

## 2017-08-05 DIAGNOSIS — Z0000 Encounter for general adult medical examination without abnormal findings: Secondary | ICD-10-CM | POA: Diagnosis not present

## 2017-08-05 LAB — LIPID PANEL
CHOLESTEROL: 193 mg/dL (ref 0–200)
HDL: 58.7 mg/dL (ref >39.00)
LDL CALC: 119 mg/dL — AB (ref 0–99)
NonHDL: 134.77
TRIGLYCERIDES: 81 mg/dL (ref 0.0–149.0)
Total CHOL/HDL Ratio: 3
VLDL: 16.2 mg/dL (ref 0.0–40.0)

## 2017-08-05 LAB — CBC
HCT: 40 % (ref 36.0–46.0)
Hemoglobin: 13 g/dL (ref 12.0–15.0)
MCHC: 32.6 g/dL (ref 30.0–36.0)
MCV: 90.5 fl (ref 78.0–100.0)
PLATELETS: 279 10*3/uL (ref 150.0–400.0)
RBC: 4.42 Mil/uL (ref 3.87–5.11)
RDW: 13.8 % (ref 11.5–15.5)
WBC: 3.7 10*3/uL — ABNORMAL LOW (ref 4.0–10.5)

## 2017-08-05 LAB — COMPREHENSIVE METABOLIC PANEL
ALT: 14 U/L (ref 0–35)
AST: 19 U/L (ref 0–37)
Albumin: 4.3 g/dL (ref 3.5–5.2)
Alkaline Phosphatase: 47 U/L (ref 39–117)
BUN: 14 mg/dL (ref 6–23)
CALCIUM: 9.4 mg/dL (ref 8.4–10.5)
CHLORIDE: 104 meq/L (ref 96–112)
CO2: 29 meq/L (ref 19–32)
Creatinine, Ser: 0.61 mg/dL (ref 0.40–1.20)
GFR: 110.45 mL/min (ref >60.00)
Glucose, Bld: 94 mg/dL (ref 70–99)
Potassium: 4.1 mEq/L (ref 3.5–5.1)
Sodium: 138 mEq/L (ref 135–145)
Total Bilirubin: 0.7 mg/dL (ref 0.2–1.2)
Total Protein: 8 g/dL (ref 6.0–8.3)

## 2017-08-05 LAB — HEMOGLOBIN A1C: Hgb A1c MFr Bld: 5.4 % (ref 4.6–6.5)

## 2017-08-05 LAB — TSH: TSH: 0.6 u[IU]/mL (ref 0.35–4.50)

## 2017-08-05 NOTE — Patient Instructions (Addendum)
It was great to see you today!  Take care and I will be in touch with your labs Continue your regular exercise program I will refer you to a gastroenterologist for your screening colonoscopy. In the meantime, try an OTC stool softener such as docusate sodium as needed to prevent hard stools and straining  You got your tetanus shot today- this is good for 10 years   For your GERD, I would recommend OTC ranitidine as needed. However this can effect your INR- if you are using this medication please do alert the office that does your INR testing, they may want to test more frequently for a while. You could also try OTC Tums which will not interact with your coumadin

## 2017-08-12 ENCOUNTER — Ambulatory Visit (INDEPENDENT_AMBULATORY_CARE_PROVIDER_SITE_OTHER): Payer: BLUE CROSS/BLUE SHIELD | Admitting: Pharmacist

## 2017-08-12 DIAGNOSIS — Z952 Presence of prosthetic heart valve: Secondary | ICD-10-CM | POA: Diagnosis not present

## 2017-08-12 DIAGNOSIS — Z7901 Long term (current) use of anticoagulants: Secondary | ICD-10-CM

## 2017-08-12 DIAGNOSIS — I359 Nonrheumatic aortic valve disorder, unspecified: Secondary | ICD-10-CM | POA: Diagnosis not present

## 2017-08-12 LAB — POCT INR: INR: 3.6

## 2017-08-31 ENCOUNTER — Telehealth: Payer: Self-pay | Admitting: Endocrinology

## 2017-08-31 ENCOUNTER — Other Ambulatory Visit: Payer: Self-pay

## 2017-08-31 MED ORDER — LEVOTHYROXINE SODIUM 75 MCG PO TABS: ORAL_TABLET | ORAL | 3 refills | Status: DC

## 2017-08-31 NOTE — Telephone Encounter (Signed)
Thyroid med is needed to be called into [facility] on [address] [phone] pts meds ran out today

## 2017-08-31 NOTE — Telephone Encounter (Signed)
This medication has been sent to the pharmacy for the patient. °

## 2017-09-02 ENCOUNTER — Other Ambulatory Visit: Payer: Self-pay

## 2017-09-02 ENCOUNTER — Telehealth: Payer: Self-pay | Admitting: Endocrinology

## 2017-09-02 MED ORDER — LEVOTHYROXINE SODIUM 75 MCG PO TABS: ORAL_TABLET | ORAL | 3 refills | Status: DC

## 2017-09-02 NOTE — Telephone Encounter (Signed)
This medication was sent on 08/31/2017 but I have sent again to the [facility] for the patient.

## 2017-09-02 NOTE — Telephone Encounter (Signed)
Patient need refill; of levothyroxine Wilmer Floor, LEVOTHROID) 75 MCG tablet [[id number]]   Pharmacy:  [facility] [address], [place]

## 2017-09-08 ENCOUNTER — Telehealth: Payer: Self-pay | Admitting: Family Medicine

## 2017-09-08 NOTE — Telephone Encounter (Signed)
Copied from [place] [id number]. Topic: Quick Communication - See Telephone Encounter >> Sep 08, 2017  1:16 PM Robina Ade, Helene Kelp D wrote: CRM for notification. See Telephone encounter for: 09/08/17. Patient called and would like to talk to [place] about her medication and another issue she is having. Please call patient back, thanks.

## 2017-09-09 ENCOUNTER — Telehealth: Payer: Self-pay | Admitting: Family Medicine

## 2017-09-09 NOTE — Telephone Encounter (Signed)
Called pt- issue is resolved

## 2017-09-09 NOTE — Telephone Encounter (Signed)
-----   Message from Emi Holes, [place] sent at 09/09/2017  9:16 AM EST ----- Pt called to see if her birth year could be changed back to [birth year] to match her driver's license. Pt's actual birth year is [birth year]. Pt states that the pharmacy told her that the driver's license and what we have in the system have to match.  Please advise.

## 2017-09-09 NOTE — Telephone Encounter (Signed)
Spoke with pt- her medication was filled.  It seems that [facility] checked the YOB on her license and would not fill her medication, but later relented.  She is asking me to change her DOB back to the incorrect year of 01-Dec-1970 as this is what is on her license, etc.  Explained to her that this information is not correct, we need to work towards getting her DOB corrected with the DMV, etc. I am opposed to changing her DOB to 13-Jan-1971 as having her correct age is important.

## 2017-09-16 ENCOUNTER — Ambulatory Visit (INDEPENDENT_AMBULATORY_CARE_PROVIDER_SITE_OTHER): Payer: BLUE CROSS/BLUE SHIELD | Admitting: Pharmacist

## 2017-09-16 DIAGNOSIS — Z952 Presence of prosthetic heart valve: Secondary | ICD-10-CM | POA: Diagnosis not present

## 2017-09-16 DIAGNOSIS — Z7901 Long term (current) use of anticoagulants: Secondary | ICD-10-CM

## 2017-09-16 DIAGNOSIS — I359 Nonrheumatic aortic valve disorder, unspecified: Secondary | ICD-10-CM

## 2017-09-16 LAB — POCT INR: INR: 3

## 2017-09-23 ENCOUNTER — Other Ambulatory Visit: Payer: BLUE CROSS/BLUE SHIELD

## 2017-09-25 ENCOUNTER — Other Ambulatory Visit (INDEPENDENT_AMBULATORY_CARE_PROVIDER_SITE_OTHER): Payer: BLUE CROSS/BLUE SHIELD

## 2017-09-25 DIAGNOSIS — E890 Postprocedural hypothyroidism: Secondary | ICD-10-CM

## 2017-09-25 LAB — T4, FREE: Free T4: 0.98 ng/dL (ref 0.60–1.60)

## 2017-09-25 LAB — TSH: TSH: 1.4 u[IU]/mL (ref 0.35–4.50)

## 2017-09-30 ENCOUNTER — Ambulatory Visit: Payer: BLUE CROSS/BLUE SHIELD | Admitting: Endocrinology

## 2017-09-30 ENCOUNTER — Encounter: Payer: Self-pay | Admitting: Endocrinology

## 2017-09-30 VITALS — BP 113/78 | HR 92 | Ht 63.5 in | Wt 138.4 lb

## 2017-09-30 DIAGNOSIS — E890 Postprocedural hypothyroidism: Secondary | ICD-10-CM | POA: Diagnosis not present

## 2017-09-30 NOTE — Progress Notes (Signed)
Patient ID: [name], female   DOB: 1970-12-10, 47 y.o.   MRN: [id number]                                                                                                                Reason for Appointment:  Follow-up for thyroid    History of Present Illness:   Prior history: She apparently wanted her PCP to check her thyroid levels because of family history of thyroid disease  She was having symptoms of palpitations, feeling warmer than usual, some increase in fatigue and shortness of breath as well as mild shakiness She thinks she may have lost 5 pounds since summer of 2017 She also  had increased frequency of bowel movements, 3-4 per day, previously would have mild constipation She was started on methimazole 10 mg twice a day in 11/17  Since she continued to have Hyperthyroidism even with 30 mg of methimazole and had persistently high thyrotropin receptor antibody she had radioactive iodine treatment with 17.8 mCi on 10/22/2016  Recent history:  On her follow-up in 6/18 she had gained 8 pounds, was complaining about feeling excessively cold although did not think her energy level is less  She was significantly hypothyroid and started on 88 g of levothyroxine  The dose was subsequently reduced down to 75 g in 06/2017 because of her low TSH  Since then she is not complaining of any unusual or persistent fatigue She is concerned about the weight gain although this is only 2 pounds since last visit No recent problems with hair loss or cold intolerance   Her TSH is now back to normal at 1.4   Wt Readings from Last 3 Encounters:  09/30/17 138 lb 6.4 oz (62.8 kg)  08/05/17 136 lb (61.7 kg)  07/01/17 136 lb (61.7 kg)      Her thyroid levels are as follows:   Lab Results  Component Value Date   TSH 1.40 09/25/2017   TSH 0.60 08/05/2017   TSH 0.17 (L) 06/24/2017   FREET4 0.98 09/25/2017   FREET4 1.37 06/24/2017   FREET4 1.21 03/20/2017     Lab Results  Component  Value Date   THYROTRECAB 15.97 (H) 09/02/2016     Allergies as of 09/30/2017   No Known Allergies     Medication List        Accurate as of 09/30/17  1:13 PM. Always use your most recent med list.          BIOTIN PO Take 0.5 tablet by mouth as needed   cholecalciferol 1000 units tablet Commonly known as:  VITAMIN D Take 1,000 Units by mouth daily.   COLLAGEN PO Take 2 capsules by mouth daily.   entecavir 1 MG tablet Commonly known as:  BARACLUDE Take 1 mg by mouth daily.   levothyroxine 75 MCG tablet Commonly known as:  SYNTHROID, LEVOTHROID TAKE 1 TABLET BY MOUTH ONCE DAILY BEFORE BREAKFAST   ONE DAILY MULTIVITAMIN WOMEN PO Take 1 tablet by mouth as needed.   warfarin  5 MG tablet Commonly known as:  COUMADIN Take as directed by the anticoagulation clinic. If you are unsure how to take this medication, talk to your nurse or doctor. Original instructions:  Take 1.5 tablets by mouth daily or as directed by coumadin clinic           Past Medical History:  Diagnosis Date  . Femur fracture, left ([place]) 1990   with open reduction and internal fixation   . H/O: rheumatic fever   . Hepatitis B carrier ([place])    positive hepatitis B carrier; maintained on Baraclude; followed by Hepatitis clinic.  Marland Kitchen History of allergic rhinitis   . Severe aortic insufficiency 09/01/2004   s/p aortic valve replacement.      Past Surgical History:  Procedure Laterality Date  . AORTIC VALVE REPLACEMENT  4/06   14mm On-X mechanical valve  . CARDIAC CATHETERIZATION  11/15/2004   Ejection fraction is estimated 55%  . HEMORRHOID SURGERY      Family History  Problem Relation Age of Onset  . Heart disease Brother        valvular dysfunction; s/p replacement  . Hypertension Mother   . Hyperlipidemia Mother   . Hypothyroidism Mother   . Heart disease Father   . Hypertension Father   . Hyperlipidemia Father     Social History:  reports that  has never smoked. she has never used  smokeless tobacco. She reports that she does not drink alcohol or use drugs.  Allergies: No Known Allergies    Review of Systems  She has been treated with warfarin since her aortic valve replacement  Complaints of palpitations not present     Examination:   BP 113/78 (BP Location: Left Arm, Patient Position: Sitting, Cuff Size: Normal)   Pulse 92   Ht 5' 3.5" (1.613 m)   Wt 138 lb 6.4 oz (62.8 kg)   BMI 24.13 kg/m   She looks well  Biceps reflexes are normal no edema   Assessment/Plan:   HYPOTHYROIDISM, secondary to I-131 ablation for Graves' disease   She is on supplementation with levothyroxine since 01/2017  He is now taking 75 g of levothyroxine and is compliant with this daily TSH is 1.4 Subjectively she is also doing well Reassured her that recent couple of pounds of weight gain is unlikely to be from her thyroid, she only had a slight change in her dosage in October  She will continue the same dose and follow-up in 6 months   [name] 09/30/2017, 1:13 PM

## 2017-10-14 DIAGNOSIS — B181 Chronic viral hepatitis B without delta-agent: Secondary | ICD-10-CM | POA: Diagnosis not present

## 2017-10-20 ENCOUNTER — Telehealth: Payer: Self-pay

## 2017-10-20 NOTE — Telephone Encounter (Signed)
[facility] needs to OK to switch the pts manufacturer for Levothyroxine since the original is on back order. Please advise.    Call back: [phone]

## 2017-10-20 NOTE — Telephone Encounter (Signed)
Pharmacy is aware

## 2017-10-20 NOTE — Telephone Encounter (Signed)
Okay to proceed.  

## 2017-10-23 ENCOUNTER — Telehealth: Payer: Self-pay | Admitting: Endocrinology

## 2017-10-23 NOTE — Telephone Encounter (Signed)
Patient called in. States that when she went to pick up her refill, she was told that her pharmacy no longer carries the previously prescribed brand of levothyroxine. Needs a new prescription, will be out of medication soon.

## 2017-10-23 NOTE — Telephone Encounter (Signed)
Called pharmacy because this was handled on 10/20/17 and they stated they see my call and will fill the new rx

## 2017-11-11 ENCOUNTER — Ambulatory Visit (INDEPENDENT_AMBULATORY_CARE_PROVIDER_SITE_OTHER): Payer: BLUE CROSS/BLUE SHIELD | Admitting: Pharmacist Clinician (PhC)/ Clinical Pharmacy Specialist

## 2017-11-11 DIAGNOSIS — I359 Nonrheumatic aortic valve disorder, unspecified: Secondary | ICD-10-CM

## 2017-11-11 DIAGNOSIS — Z7901 Long term (current) use of anticoagulants: Secondary | ICD-10-CM | POA: Diagnosis not present

## 2017-11-11 DIAGNOSIS — Z952 Presence of prosthetic heart valve: Secondary | ICD-10-CM | POA: Diagnosis not present

## 2017-11-11 LAB — POCT INR: INR: 2.8

## 2017-11-11 NOTE — Patient Instructions (Signed)
Description   Continue taking 1 tablet daily except 1.5 tablets each Monday and Friday.  Repeat INR in 6 weeks

## 2017-11-13 ENCOUNTER — Other Ambulatory Visit: Payer: Self-pay | Admitting: Pharmacist Clinician (PhC)/ Clinical Pharmacy Specialist

## 2017-11-13 MED ORDER — WARFARIN SODIUM 5 MG PO TABS: ORAL_TABLET | ORAL | 1 refills | Status: DC

## 2017-11-22 DIAGNOSIS — J019 Acute sinusitis, unspecified: Secondary | ICD-10-CM | POA: Diagnosis not present

## 2017-12-04 ENCOUNTER — Encounter: Payer: Self-pay | Admitting: Family Medicine

## 2017-12-08 DIAGNOSIS — J209 Acute bronchitis, unspecified: Secondary | ICD-10-CM | POA: Diagnosis not present

## 2017-12-08 DIAGNOSIS — R05 Cough: Secondary | ICD-10-CM | POA: Diagnosis not present

## 2017-12-23 ENCOUNTER — Telehealth: Payer: Self-pay | Admitting: Endocrinology

## 2017-12-23 NOTE — Telephone Encounter (Signed)
Please advise 

## 2017-12-23 NOTE — Telephone Encounter (Signed)
Okay to use generic

## 2017-12-23 NOTE — Telephone Encounter (Signed)
[facility] calling asking if they can change the thyroid medicatiom to levothyroxine the generic brand?? Please advise  P# [phone] F# [phone]

## 2017-12-24 ENCOUNTER — Other Ambulatory Visit: Payer: Self-pay

## 2018-01-03 DIAGNOSIS — J4541 Moderate persistent asthma with (acute) exacerbation: Secondary | ICD-10-CM | POA: Diagnosis not present

## 2018-01-03 DIAGNOSIS — R062 Wheezing: Secondary | ICD-10-CM | POA: Diagnosis not present

## 2018-01-03 DIAGNOSIS — R05 Cough: Secondary | ICD-10-CM | POA: Diagnosis not present

## 2018-01-06 ENCOUNTER — Ambulatory Visit (INDEPENDENT_AMBULATORY_CARE_PROVIDER_SITE_OTHER): Payer: BLUE CROSS/BLUE SHIELD | Admitting: Pharmacist

## 2018-01-06 DIAGNOSIS — Z952 Presence of prosthetic heart valve: Secondary | ICD-10-CM | POA: Diagnosis not present

## 2018-01-06 DIAGNOSIS — I359 Nonrheumatic aortic valve disorder, unspecified: Secondary | ICD-10-CM

## 2018-01-06 DIAGNOSIS — Z7901 Long term (current) use of anticoagulants: Secondary | ICD-10-CM

## 2018-01-06 LAB — POCT INR: INR: 5.8

## 2018-01-06 NOTE — Progress Notes (Signed)
[facility] at [facility] [place], [place], [place] [address] [phone]  Date:  01/07/2018   Name:  [name]   DOB:  13-Jul-1971   MRN:  [id number]  PCP:  Darreld Mclean, MD    Chief Complaint: Cough (went to urgent care on Sunday, possible asthma or allergies, wheezing, worse when lying down)   History of Present Illness:  [name] is a 47 y.o. very pleasant female patient who presents with the following:  History of hep B, aortic valve disorder on coumadin, hypothyroidism Here today with concern of illness  Dr. Dwyane Dee is her endocrinologist - she does NOT have DM Lab Results  Component Value Date   HGBA1C 5.4 08/05/2017   I last saw her for a CPE in December   She is here today with cough and illness- she has noted about 3 episodes of this sort of symptom over the last 2 months She went to UC this past Sunday- today is Thursday- and she was given several medications: levaquin Loratadine proair symbicort Prednisone- taper pack  They also did a CXR for her at the UC per her report   At this time she notes that she still has some cough and that she may have a runny nose. She may also have wheezing at night She did not use her inhalers yet today   Her INR was just checked yesterday by [facility] and was high, likely due to LQ abx use - they are repeating her INR in one week   Lab Results  Component Value Date   INR 5.8 01/06/2018   INR 2.8 11/11/2017   INR 3.0 09/16/2017    Her cough is keeping her awake at night - she requests something that will help her sleep   NCCSR: nothing since 2017 Patient Active Problem List   Diagnosis Date Noted  . Hypothyroidism, postradioiodine therapy 02/04/2017  . Vasovagal syncope 05/02/2015  . Hepatitis B 04/28/2012  . Aortic valve disorder 02/11/2012  . Long term (current) use of anticoagulants 02/11/2012  . S/P AVR 12/16/2011  . Chronic anticoagulation 12/16/2011   . PULMONARY NODULE 01/31/2010  . DYSPNEA 01/31/2010    Past Medical History:  Diagnosis Date  . Femur fracture, left ([place]) 1990   with open reduction and internal fixation   . H/O: rheumatic fever   . Hepatitis B carrier ([place])    positive hepatitis B carrier; maintained on Baraclude; followed by Hepatitis clinic.  Marland Kitchen History of allergic rhinitis   . Severe aortic insufficiency 09/01/2004   s/p aortic valve replacement.      Past Surgical History:  Procedure Laterality Date  . AORTIC VALVE REPLACEMENT  4/06   20mm On-X mechanical valve  . CARDIAC CATHETERIZATION  11/15/2004   Ejection fraction is estimated 55%  . HEMORRHOID SURGERY      Social History   Tobacco Use  . Smoking status: Never Smoker  . Smokeless tobacco: Never Used  Substance Use Topics  . Alcohol use: No    Alcohol/week: 0.0 oz  . Drug use: No    Family History  Problem Relation Age of Onset  . Heart disease Brother        valvular dysfunction; s/p replacement  . Hypertension Mother   . Hyperlipidemia Mother   . Hypothyroidism Mother   . Heart disease Father   . Hypertension Father   . Hyperlipidemia Father     No Known  Allergies  Medication list has been reviewed and updated.  Current Outpatient Medications on File Prior to Visit  Medication Sig Dispense Refill  . benzonatate (TESSALON) 100 MG capsule benzonatate 100 mg capsule    . BIOTIN PO Take 0.5 tablet by mouth as needed    . cholecalciferol (VITAMIN D) 1000 units tablet Take 1,000 Units by mouth daily.    . COLLAGEN PO Take 2 capsules by mouth daily.    Marland Kitchen entecavir (BARACLUDE) 1 MG tablet Take 1 mg by mouth daily.    Marland Kitchen levothyroxine (SYNTHROID, LEVOTHROID) 75 MCG tablet TAKE 1 TABLET BY MOUTH ONCE DAILY BEFORE BREAKFAST 30 tablet 3  . Multiple Vitamins-Minerals (ONE DAILY MULTIVITAMIN WOMEN PO) Take 1 tablet by mouth as needed.     . SYMBICORT 160-4.5 MCG/ACT inhaler INHALE 2 PUFFS PO BID  0  . warfarin (COUMADIN) 5 MG tablet Take 1.5  tablets by mouth daily or as directed by coumadin clinic 150 tablet 1   No current facility-administered medications on file prior to visit.     Review of Systems:  As per HPI- otherwise negative. No fever or chills No ST No rash    Physical Examination: Vitals:   01/07/18 0832  BP: 122/88  Pulse: 74  Resp: 16  Temp: 98.3 F (36.8 C)  SpO2: 98%   Vitals:   01/07/18 0832  Weight: 138 lb (62.6 kg)  Height: 5' 3.5" (1.613 m)   Body mass index is 24.06 kg/m. Ideal Body Weight: Weight in (lb) to have BMI = 25: 143.1  GEN: WDWN, NAD, Non-toxic, A & O x 3, normal weight, looks well  HEENT: Atraumatic, Normocephalic. Neck supple. No masses, No LAD. Bilateral TM wnl, oropharynx normal.  PEERL,EOMI.   Ears and Nose: No external deformity. CV: RRR, No M/G/R. No JVD. No thrill. No extra heart sounds. PULM: CTA B, no crackles, rhonchi. No retractions. No resp. distress. No accessory muscle use. Minimal wheezing on exam today  ABD: S, NT, ND, +BS. No rebound. No HSM. EXTR: No c/c/e NEURO Normal gait.  PSYCH: Normally interactive. Conversant. Not depressed or anxious appearing.  Calm demeanor.    Assessment and Plan: Wheezing  Cough - Plan: HYDROcodone-homatropine (HYCODAN) 5-1.5 MG/5ML syrup  Pt was seen at UC about 5 days ago and given several meds as above including levquin, pred, symbicort and albuterol At this point she is improved but sx are not resolved Asked her to use her symbicort inahler as directed to help prevent wheezing  As she has not finished her prednisone and levaquin don't think we can judge if her treatment was effective yet She requests a cough med- rx for hycodan, cautioned regarding sedation  Asked her to alert me if sx continue   Signed Lamar Blinks, MD

## 2018-01-07 ENCOUNTER — Ambulatory Visit: Payer: BLUE CROSS/BLUE SHIELD | Admitting: Family Medicine

## 2018-01-07 ENCOUNTER — Encounter: Payer: Self-pay | Admitting: Family Medicine

## 2018-01-07 VITALS — BP 122/88 | HR 74 | Temp 98.3°F | Resp 16 | Ht 63.5 in | Wt 138.0 lb

## 2018-01-07 DIAGNOSIS — R059 Cough, unspecified: Secondary | ICD-10-CM

## 2018-01-07 DIAGNOSIS — R062 Wheezing: Secondary | ICD-10-CM

## 2018-01-07 DIAGNOSIS — R05 Cough: Secondary | ICD-10-CM

## 2018-01-07 MED ORDER — HYDROCODONE-HOMATROPINE 5-1.5 MG/5ML PO SYRP: 5.0000 mL | ORAL_SOLUTION | Freq: Three times a day (TID) | ORAL | 0 refills | Status: AC | PRN

## 2018-01-07 NOTE — Patient Instructions (Addendum)
It was good to see you today- you are really doing everything that I would recommend for asthma or allergies  Please finish out your antibiotic and prednisone. If you are still feeling ill after you finish these please let me know You can use the cough medication as needed ,but remember it will make you sleepy   Please try to take note of when you cough or have wheezing as this may help [place] to determine if you do have asthma

## 2018-01-13 ENCOUNTER — Ambulatory Visit (INDEPENDENT_AMBULATORY_CARE_PROVIDER_SITE_OTHER): Payer: BLUE CROSS/BLUE SHIELD | Admitting: Pharmacist Clinician (PhC)/ Clinical Pharmacy Specialist

## 2018-01-13 DIAGNOSIS — Z952 Presence of prosthetic heart valve: Secondary | ICD-10-CM | POA: Diagnosis not present

## 2018-01-13 DIAGNOSIS — I359 Nonrheumatic aortic valve disorder, unspecified: Secondary | ICD-10-CM

## 2018-01-13 DIAGNOSIS — Z7901 Long term (current) use of anticoagulants: Secondary | ICD-10-CM

## 2018-01-13 LAB — POCT INR: INR: 3.1

## 2018-01-13 NOTE — Patient Instructions (Signed)
Description   Continue with 1 tablet daily except 1.5 tablets each Monday and Friday.  Repeat INR in 3 weeks.  If you get another antibiotic or prednisone, please call Kevion Fatheree/Raquel at [phone].

## 2018-01-26 ENCOUNTER — Other Ambulatory Visit: Payer: Self-pay | Admitting: Endocrinology

## 2018-02-08 ENCOUNTER — Ambulatory Visit (INDEPENDENT_AMBULATORY_CARE_PROVIDER_SITE_OTHER): Payer: BLUE CROSS/BLUE SHIELD | Admitting: Pharmacist

## 2018-02-08 DIAGNOSIS — Z7901 Long term (current) use of anticoagulants: Secondary | ICD-10-CM

## 2018-02-08 DIAGNOSIS — I359 Nonrheumatic aortic valve disorder, unspecified: Secondary | ICD-10-CM

## 2018-02-08 DIAGNOSIS — Z952 Presence of prosthetic heart valve: Secondary | ICD-10-CM

## 2018-02-08 LAB — POCT INR: INR: 3.1 — AB (ref 2.0–3.0)

## 2018-02-09 NOTE — Progress Notes (Addendum)
[place] at [facility] [address] [place], [place] [address] [phone]  Date:  02/10/2018   Name:  [name]   DOB:  03-Dec-1970   MRN:  [id number]  PCP:  Darreld Mclean, MD    Chief Complaint: Hypothyroidism (6 month follow up, recheck lab work)   History of Present Illness:  [name] is a 47 y.o. very pleasant female patient who presents with the following:  6 month follow-up today History of hypothyroidism, hep B, AVR/ anticoagulation managed by coumadin clinic/ cardiology Lab Results  Component Value Date   TSH 1.40 09/25/2017   Last visit with me in December of 2018: Here today for a CPE but no pap lation for an aortic valve issue- had replacement with mechanical valve in 2006.  She does have hep B followed by [facility]- however it seems that they have referred her to a clinic in [facility] for some reason.  Cardiologist is Dr. Martinique, her INR is followed by cardiology coumadin clinic.  She sees OB/GYN with [place] and is up to date on her mammogram and pap She notes issues with insomnia over the years- this will come and go with intermittent worsening. She has had a hard time especially the last 3 days, feels that she cannot get her mind to stop working  She has tried melatonin, benadryl- however notes that benadryl leaves her tired in the am, melatonin does not really work, her sx seem to come back again, wonders if there is a medication that can cure her insomnia permanently?  She is a non- smoker, non- drinker. Periods are regular, LMP 10/10. She does not plan any future pregnancy Liver function tests from 08/2014 normal Pt notes that she has been well since the last time we saw her except she had developed hyperthyroidism due to Graves disease, then was treated with RAI and is now hypothyroid- being treated with thyroid replacement Her endocrinologist is Dr. Dwyane Dee She notes that she had  hemorrhoid surgery several years ago She notes that sometimes she has to strain to pass a BM, and can have some blood.  She has noted this for the last couple of months- wonders if might be related to her thryoid issues Her DOB is listed as [birth year]- however pt notes that this is not correct, and that her DOB was changed when her family fled from war.   Pt notes that she is actually turning 77 coming up-Her DOB is 11/02/1965.  Will correct this in her chart    She notes that she still has some trouble sleeping. She uses honey in warm water or warm milk-this does help her. She wonders if this is ok to use; reassured that this is ok She is still on coumadin which is managed by cardiology anticoag clinic Hep B is stable per her report Reviewed most recent note from her GI provider with [facility]  She sees Dr. Dwyane Dee for her thyroid- we will check a level for her today while we are drawing blood Need to recheck mild leukopenia today Of note pts listed DOB is incorrect- she is actually 47 yo.  Discussed having her see GI for a colon cancer screening- I had referred her last year when she had complaint of rectal bleeding but she did not respond to request to schedule appt.  She declines to do this today, wishes to discuss again next year   She has  3 kids- one in college at [employer], one at [employer], and her youngest is 45 yo and still at home  Patient Active Problem List   Diagnosis Date Noted  . Hypothyroidism, postradioiodine therapy 02/04/2017  . Vasovagal syncope 05/02/2015  . Hepatitis B 04/28/2012  . Aortic valve disorder 02/11/2012  . Long term (current) use of anticoagulants 02/11/2012  . S/P AVR 12/16/2011  . Chronic anticoagulation 12/16/2011  . PULMONARY NODULE 01/31/2010  . DYSPNEA 01/31/2010    Past Medical History:  Diagnosis Date  . Femur fracture, left ([facility]) 1990   with open reduction and internal fixation   . H/O: rheumatic fever   . Hepatitis B carrier ([place])    positive  hepatitis B carrier; maintained on Baraclude; followed by Hepatitis clinic.  Marland Kitchen History of allergic rhinitis   . Severe aortic insufficiency 09/01/2004   s/p aortic valve replacement.      Past Surgical History:  Procedure Laterality Date  . AORTIC VALVE REPLACEMENT  4/06   8mm On-X mechanical valve  . CARDIAC CATHETERIZATION  11/15/2004   Ejection fraction is estimated 55%  . HEMORRHOID SURGERY      Social History   Tobacco Use  . Smoking status: Never Smoker  . Smokeless tobacco: Never Used  Substance Use Topics  . Alcohol use: No    Alcohol/week: 0.0 oz  . Drug use: No    Family History  Problem Relation Age of Onset  . Heart disease Brother        valvular dysfunction; s/p replacement  . Hypertension Mother   . Hyperlipidemia Mother   . Hypothyroidism Mother   . Heart disease Father   . Hypertension Father   . Hyperlipidemia Father     No Known Allergies  Medication list has been reviewed and updated.  Current Outpatient Medications on File Prior to Visit  Medication Sig Dispense Refill  . entecavir (BARACLUDE) 1 MG tablet Take 1 mg by mouth daily.    Marland Kitchen levothyroxine (SYNTHROID, LEVOTHROID) 75 MCG tablet TAKE 1 TABLET BY MOUTH ONCE DAILY BEFORE BREAKFAST 30 tablet 3  . SYMBICORT 160-4.5 MCG/ACT inhaler INHALE 2 PUFFS PO BID  0  . warfarin (COUMADIN) 5 MG tablet Take 1.5 tablets by mouth daily or as directed by coumadin clinic 150 tablet 1   No current facility-administered medications on file prior to visit.     Review of Systems:  As per HPI- otherwise negative. No vomiting or diarrhea No fever or chills    Physical Examination: Vitals:   02/10/18 0954  BP: 110/74  Pulse: 86  Resp: 16  SpO2: 99%   Vitals:   02/10/18 0954  Weight: 137 lb 6.4 oz (62.3 kg)  Height: 5' 3.5" (1.613 m)   Body mass index is 23.96 kg/m. Ideal Body Weight: Weight in (lb) to have BMI = 25: 143.1  GEN: WDWN, NAD, Non-toxic, A & O x 3, normal weight, looks well   HEENT: Atraumatic, Normocephalic. Neck supple. No masses, No LAD.  Bilateral TM wnl, oropharynx normal.  PEERL,EOMI.   Ears and Nose: No external deformity. CV: RRR- sound of MVR is present, No M/G/R. No JVD. No thrill. No extra heart sounds. PULM: CTA B, no wheezes, crackles, rhonchi. No retractions. No resp. distress. No accessory muscle use. ABD: S, NT, ND EXTR: No c/c/e NEURO Normal gait.  PSYCH: Normally interactive. Conversant. Not depressed or anxious appearing.  Calm demeanor.    Assessment and Plan: Acquired hypothyroidism - Plan: TSH, T4, free  Leukopenia, unspecified  type - Plan: CBC  Medication monitoring encounter - Plan: Basic metabolic panel  Screening for colon cancer  Long term (current) use of anticoagulants  Here today for a follow-up visit  She declines referral to local GI for colon cancer screening today.  She is not having any further rectal bleeidng  followup on mild leukopenia today BMP pending Her hep B is managed by [facility] Obtain thyroid results for her, and will forward to DR. Kumar for her once in   [place], [place]  Received her labs 6/13, note to Dr. Dwyane Dee  Results for orders placed or performed in visit on 02/10/18  CBC  Result Value Ref Range   WBC 4.6 4.0 - 10.5 K/uL   RBC 4.60 3.87 - 5.11 Mil/uL   Platelets 338.0 150.0 - 400.0 K/uL   Hemoglobin 11.3 (L) 12.0 - 15.0 g/dL   HCT 35.7 (L) 36.0 - 46.0 %   MCV 77.7 (L) 78.0 - 100.0 fl   MCHC 31.7 30.0 - 36.0 g/dL   RDW 17.2 (H) 11.5 - 70.6 %  Basic metabolic panel  Result Value Ref Range   Sodium 140 135 - 145 mEq/L   Potassium 4.9 3.5 - 5.1 mEq/L   Chloride 107 96 - 112 mEq/L   CO2 25 19 - 32 mEq/L   Glucose, Bld 88 70 - 99 mg/dL   BUN 14 6 - 23 mg/dL   Creatinine, Ser 0.61 0.40 - 1.20 mg/dL   Calcium 9.5 8.4 - 10.5 mg/dL   GFR 112.59 >60.00 mL/min  TSH  Result Value Ref Range   TSH 1.76 0.35 - 4.50 uIU/mL  T4, free  Result Value Ref Range   Free T4 1.17  0.60 - 1.60 ng/dL   Letter to pt Thyroid levels and metabolic profile are normal.  Your white cell count is back to normal! However, you are slightly anemic this time which is usual for you.  I think you may be low on iron.  I am going to order a repeat CBC and iron level for you; please come in for a lab visit only to have this rechecked within a month.  Otherwise we can plan to visit here in 6 months.  Take care!

## 2018-02-10 ENCOUNTER — Ambulatory Visit: Payer: BLUE CROSS/BLUE SHIELD | Admitting: Family Medicine

## 2018-02-10 ENCOUNTER — Encounter: Payer: Self-pay | Admitting: Family Medicine

## 2018-02-10 VITALS — BP 110/74 | HR 86 | Resp 16 | Ht 63.5 in | Wt 137.4 lb

## 2018-02-10 DIAGNOSIS — Z7901 Long term (current) use of anticoagulants: Secondary | ICD-10-CM | POA: Diagnosis not present

## 2018-02-10 DIAGNOSIS — Z1211 Encounter for screening for malignant neoplasm of colon: Secondary | ICD-10-CM | POA: Diagnosis not present

## 2018-02-10 DIAGNOSIS — Z5181 Encounter for therapeutic drug level monitoring: Secondary | ICD-10-CM

## 2018-02-10 DIAGNOSIS — D72819 Decreased white blood cell count, unspecified: Secondary | ICD-10-CM

## 2018-02-10 DIAGNOSIS — E039 Hypothyroidism, unspecified: Secondary | ICD-10-CM | POA: Diagnosis not present

## 2018-02-10 DIAGNOSIS — R718 Other abnormality of red blood cells: Secondary | ICD-10-CM | POA: Diagnosis not present

## 2018-02-10 LAB — TSH: TSH: 1.76 u[IU]/mL (ref 0.35–4.50)

## 2018-02-10 LAB — BASIC METABOLIC PANEL
BUN: 14 mg/dL (ref 6–23)
CHLORIDE: 107 meq/L (ref 96–112)
CO2: 25 meq/L (ref 19–32)
CREATININE: 0.61 mg/dL (ref 0.40–1.20)
Calcium: 9.5 mg/dL (ref 8.4–10.5)
GFR: 112.59 mL/min (ref >60.00)
Glucose, Bld: 88 mg/dL (ref 70–99)
Potassium: 4.9 mEq/L (ref 3.5–5.1)
Sodium: 140 mEq/L (ref 135–145)

## 2018-02-10 LAB — CBC
HCT: 35.7 % — ABNORMAL LOW (ref 36.0–46.0)
HEMOGLOBIN: 11.3 g/dL — AB (ref 12.0–15.0)
MCHC: 31.7 g/dL (ref 30.0–36.0)
MCV: 77.7 fl — ABNORMAL LOW (ref 78.0–100.0)
PLATELETS: 338 10*3/uL (ref 150.0–400.0)
RBC: 4.6 Mil/uL (ref 3.87–5.11)
RDW: 17.2 % — ABNORMAL HIGH (ref 11.5–15.5)
WBC: 4.6 10*3/uL (ref 4.0–10.5)

## 2018-02-10 LAB — T4, FREE: FREE T4: 1.17 ng/dL (ref 0.60–1.60)

## 2018-02-10 NOTE — Patient Instructions (Signed)
Great to see you today- I will check your thyroid labs and send to Dr. Dwyane Dee for you Otherwise I will be in touch with your labs asap

## 2018-02-11 NOTE — Addendum Note (Signed)
Addended by: Lamar Blinks C on: 02/11/2018 07:35 AM   Modules accepted: Orders

## 2018-02-24 ENCOUNTER — Other Ambulatory Visit (INDEPENDENT_AMBULATORY_CARE_PROVIDER_SITE_OTHER): Payer: BLUE CROSS/BLUE SHIELD

## 2018-02-24 DIAGNOSIS — R718 Other abnormality of red blood cells: Secondary | ICD-10-CM | POA: Diagnosis not present

## 2018-02-25 LAB — CBC
HEMATOCRIT: 31.1 % — AB (ref 36.0–46.0)
Hemoglobin: 9.9 g/dL — ABNORMAL LOW (ref 12.0–15.0)
MCHC: 31.8 g/dL (ref 30.0–36.0)
MCV: 77.6 fl — AB (ref 78.0–100.0)
PLATELETS: 320 10*3/uL (ref 150.0–400.0)
RBC: 4 Mil/uL (ref 3.87–5.11)
RDW: 17.5 % — ABNORMAL HIGH (ref 11.5–15.5)
WBC: 5.5 10*3/uL (ref 4.0–10.5)

## 2018-02-25 LAB — FERRITIN: Ferritin: 4.6 ng/mL — ABNORMAL LOW (ref 10.0–291.0)

## 2018-03-02 ENCOUNTER — Telehealth: Payer: Self-pay | Admitting: Family Medicine

## 2018-03-02 DIAGNOSIS — D509 Iron deficiency anemia, unspecified: Secondary | ICD-10-CM

## 2018-03-02 MED ORDER — FERROUS SULFATE 325 (65 FE) MG PO TABS: 325.0000 mg | ORAL_TABLET | Freq: Two times a day (BID) | ORAL | 3 refills | Status: DC

## 2018-03-02 NOTE — Telephone Encounter (Signed)
Called her about her labs.  Her anemia has gotten worse, and her iron is quite low. Will start on iron supplement and refer to GI for possible colonoscopy.    Results for orders placed or performed in visit on 02/24/18  Ferritin  Result Value Ref Range   Ferritin 4.6 (L) 10.0 - 291.0 ng/mL  CBC  Result Value Ref Range   WBC 5.5 4.0 - 10.5 K/uL   RBC 4.00 3.87 - 5.11 Mil/uL   Platelets 320.0 150.0 - 400.0 K/uL   Hemoglobin 9.9 (L) 12.0 - 15.0 g/dL   HCT 31.1 (L) 36.0 - 46.0 %   MCV 77.6 (L) 78.0 - 100.0 fl   MCHC 31.8 30.0 - 36.0 g/dL   RDW 17.5 (H) 11.5 - 15.5 %   She is a pt of Dr. Charlesetta Garibaldi - she does notice that her menses are heavy as well.  Dr. Charlesetta Garibaldi thought this was due to her being on coumadin.

## 2018-03-03 ENCOUNTER — Encounter: Payer: Self-pay | Admitting: Gastroenterology

## 2018-03-08 NOTE — Progress Notes (Signed)
Cardiology Office Note   Date:  03/10/2018   ID:  [name], DOB June 21, 1971, MRN [id number]  PCP:  Darreld Mclean, MD  Cardiologist:  Dr. Martinique  Chief Complaint  Patient presents with  . Follow-up    Aortic Valve Repair     History of Present Illness: [name] is a 47 y.o. female who presents for ongoing assessment and management of severe AoV insufficiency with AoV replacement with mechanical prosthesis 11/2004, she is on chronic coumadin, with other history of Graves disease. Was last seen by Dr. Martinique on 12/31/2016.   She is without cardiac complaints today. She has not chest pain, DOE, or dizziness. She is medically compliant. She is followed by our pharmacists at the [facility] location.   Past Medical History:  Diagnosis Date  . Femur fracture, left ([place]) 1990   with open reduction and internal fixation   . H/O: rheumatic fever   . Hepatitis B carrier ([place])    positive hepatitis B carrier; maintained on Baraclude; followed by Hepatitis clinic.  Marland Kitchen History of allergic rhinitis   . Severe aortic insufficiency 09/01/2004   s/p aortic valve replacement.      Past Surgical History:  Procedure Laterality Date  . AORTIC VALVE REPLACEMENT  4/06   7mm On-X mechanical valve  . CARDIAC CATHETERIZATION  11/15/2004   Ejection fraction is estimated 55%  . HEMORRHOID SURGERY       Current Outpatient Medications  Medication Sig Dispense Refill  . entecavir (BARACLUDE) 1 MG tablet Take 1 mg by mouth daily.    . ferrous sulfate 325 (65 FE) MG tablet Take 1 tablet (325 mg total) by mouth 2 (two) times daily with a meal. 60 tablet 3  . levothyroxine (SYNTHROID, LEVOTHROID) 75 MCG tablet TAKE 1 TABLET BY MOUTH ONCE DAILY BEFORE BREAKFAST 30 tablet 3  . warfarin (COUMADIN) 5 MG tablet Take 1.5 tablets by mouth daily or as directed by coumadin clinic 150 tablet 1   No current facility-administered medications for this visit.     Allergies:   Patient has no known allergies.     Social History:  The patient  reports that she has never smoked. She has never used smokeless tobacco. She reports that she does not drink alcohol or use drugs.   Family History:  The patient's family history includes Heart disease in her brother and father; Hyperlipidemia in her father and mother; Hypertension in her father and mother; Hypothyroidism in her mother.    ROS: All other systems are reviewed and negative. Unless otherwise mentioned in H&P    PHYSICAL EXAM: VS:  BP 110/72   Pulse 71   Ht 5\' 4"  (1.626 m)   Wt 137 lb (62.1 kg)   BMI 23.52 kg/m  , BMI Body mass index is 23.52 kg/m. GEN: Well nourished, well developed, in no acute distress  HEENT: normal  Neck: no JVD, carotid bruits, or masses Cardiac: RRR; crisp diastolic click. murmurs, rubs, or gallops,no edema  Respiratory:  clear to auscultation bilaterally, normal work of breathing GI: soft, nontender, nondistended, + BS MS: no deformity or atrophy  Skin: warm and dry, no rash Neuro:  Strength and sensation are intact Psych: euthymic mood, full affect   EKG:  Sinus rhythm 71 bpm, non-specific abnormality. 71 bpm.  Recent Labs: 08/05/2017: ALT 14 02/10/2018: BUN 14; Creatinine, Ser 0.61; Potassium 4.9; Sodium 140; TSH 1.76 02/24/2018: Hemoglobin 9.9; Platelets 320.0    Lipid Panel    Component Value Date/Time  CHOL 193 08/05/2017 1108   TRIG 81.0 08/05/2017 1108   HDL 58.70 08/05/2017 1108   CHOLHDL 3 08/05/2017 1108   VLDL 16.2 08/05/2017 1108   LDLCALC 119 (H) 08/05/2017 1108      Wt Readings from Last 3 Encounters:  03/10/18 137 lb (62.1 kg)  02/10/18 137 lb 6.4 oz (62.3 kg)  01/07/18 138 lb (62.6 kg)      Other studies Reviewed: Echocardiogram Jan 04, 2015  - Left ventricle: The cavity size was normal. Wall thickness was normal. Systolic function was normal. The estimated ejection fraction was in the range of 55% to 60%. Left ventricular diastolic function parameters were normal. -  Aortic valve: There was a mechanical aortic valve. No significant stenosis. There was trivial regurgitation. Mean gradient (S): 11 mm Hg. - Mitral valve: There was mild regurgitation. - Right ventricle: The cavity size was normal. Systolic function was normal. - Tricuspid valve: Peak RV-RA gradient (S): 22 mm Hg. - Pulmonary arteries: PA peak pressure: 25 mm Hg (S). - Inferior vena cava: The vessel was normal in size. The respirophasic diameter changes were in the normal range (= 50%), consistent with normal central venous pressure.  ASSESSMENT AND PLAN:  1.Aortic Valve Disease: S/P 58mm On-X mechanical valve. She is doing well and is without complaints of dizziness or chest pain. She denies dyspnea. She will continue annual visits and see pharmacy for coumadin dosing and INR checks   2 Anemia; Has recently been place on iron supplement by PCP, Most recent Hgb 9.9 with Hct 31.1. Follow up labs per PCP  3. Graves Disease: She is followed by Dr. Dwyane Dee for thyroid management.    Current medicines are reviewed at length with the patient today.    Labs/ tests ordered today include: None   Phill Myron. West Pugh, ANP, AACC   03/10/2018 10:29 AM    [facility] [address] [place], [place] [address] Phone: [phone]; Fax: [phone]

## 2018-03-10 ENCOUNTER — Ambulatory Visit (INDEPENDENT_AMBULATORY_CARE_PROVIDER_SITE_OTHER): Payer: BLUE CROSS/BLUE SHIELD | Admitting: Pharmacist Clinician (PhC)/ Clinical Pharmacy Specialist

## 2018-03-10 ENCOUNTER — Ambulatory Visit: Payer: BLUE CROSS/BLUE SHIELD | Admitting: Adult Health

## 2018-03-10 ENCOUNTER — Encounter: Payer: Self-pay | Admitting: Adult Health

## 2018-03-10 VITALS — BP 110/72 | HR 71 | Ht 64.0 in | Wt 137.0 lb

## 2018-03-10 DIAGNOSIS — Z952 Presence of prosthetic heart valve: Secondary | ICD-10-CM

## 2018-03-10 DIAGNOSIS — I359 Nonrheumatic aortic valve disorder, unspecified: Secondary | ICD-10-CM

## 2018-03-10 DIAGNOSIS — Z7901 Long term (current) use of anticoagulants: Secondary | ICD-10-CM

## 2018-03-10 LAB — POCT INR: INR: 2.3 (ref 2.0–3.0)

## 2018-03-10 NOTE — Patient Instructions (Signed)
Medication Instructions:  NO CHANGES- Your physician recommends that you continue on your current medications as directed. Please refer to the Current Medication list given to you today.  If you need a refill on your cardiac medications before your next appointment, please call your pharmacy.  Follow-Up: Your physician wants you to follow-up in: 12 MONTHS WITH DR Martinique You should receive a reminder letter in the mail two months in advance. If you do not receive a letter, please call our office MAY 2020 to schedule the July 2020 follow-up appointment.   Thank you for choosing [facility] at [facility]!!

## 2018-03-26 ENCOUNTER — Other Ambulatory Visit: Payer: BLUE CROSS/BLUE SHIELD

## 2018-03-31 ENCOUNTER — Encounter: Payer: Self-pay | Admitting: Endocrinology

## 2018-03-31 ENCOUNTER — Ambulatory Visit: Payer: BLUE CROSS/BLUE SHIELD | Admitting: Endocrinology

## 2018-03-31 VITALS — BP 102/60 | HR 86 | Ht 64.0 in | Wt 136.6 lb

## 2018-03-31 DIAGNOSIS — E890 Postprocedural hypothyroidism: Secondary | ICD-10-CM

## 2018-03-31 NOTE — Progress Notes (Signed)
Patient ID: [name], female   DOB: [birth year], 47 y.o.   MRN: [id number]                                                                                                                Reason for Appointment:  Follow-up for thyroid    History of Present Illness:   Prior history: She apparently wanted her PCP to check her thyroid levels because of family history of thyroid disease  She was having symptoms of palpitations, feeling warmer than usual, some increase in fatigue and shortness of breath as well as mild shakiness She thinks she may have lost 5 pounds since summer of 2017 She also  had increased frequency of bowel movements, 3-4 per day, previously would have mild constipation She was started on methimazole 10 mg twice a day in 11/17  Since she continued to have Hyperthyroidism even with 30 mg of methimazole and had persistently high thyrotropin receptor antibody she had radioactive iodine treatment with 17.8 mCi on 10/22/2016  Recent history:  On her follow-up in 6/18 she had gained 8 pounds, was complaining about feeling excessively cold although did not think her energy level is less  She was significantly hypothyroid and started on 88 g of levothyroxine  The dose was subsequently reduced down to 75 g in 06/2017 because of her low TSH Subsequently has required the same dose  Recently has not been having any fatigue or weight change Having mild hot flashes Has had mild hair loss which is not as severe as before  She takes her levothyroxine at least 30 minutes before breakfast and probably takes her iron tablet at least an hour or more later after breakfast  Her TSH done by PCP in June was quite normal    Wt Readings from Last 3 Encounters:  03/31/18 136 lb 9.6 oz (62 kg)  03/10/18 137 lb (62.1 kg)  02/10/18 137 lb 6.4 oz (62.3 kg)      Her thyroid levels are as follows:   Lab Results  Component Value Date   TSH 1.76 02/10/2018   TSH 1.40 09/25/2017   TSH  0.60 08/05/2017   FREET4 1.17 02/10/2018   FREET4 0.98 09/25/2017   FREET4 1.37 06/24/2017     Lab Results  Component Value Date   THYROTRECAB 15.97 (H) 09/02/2016     Allergies as of 03/31/2018   No Known Allergies     Medication List        Accurate as of 03/31/18  1:16 PM. Always use your most recent med list.          entecavir 1 MG tablet Commonly known as:  BARACLUDE Take 1 mg by mouth daily.   ferrous sulfate 325 (65 FE) MG tablet Take 1 tablet (325 mg total) by mouth 2 (two) times daily with a meal.   levothyroxine 75 MCG tablet Commonly known as:  SYNTHROID, LEVOTHROID TAKE 1 TABLET BY MOUTH ONCE DAILY BEFORE BREAKFAST   warfarin 5 MG tablet Commonly known as:  COUMADIN Take as directed by the anticoagulation clinic. If you are unsure how to take this medication, talk to your nurse or doctor. Original instructions:  Take 1.5 tablets by mouth daily or as directed by coumadin clinic           Past Medical History:  Diagnosis Date  . Femur fracture, left ([place]) 1990   with open reduction and internal fixation   . H/O: rheumatic fever   . Hepatitis B carrier ([place])    positive hepatitis B carrier; maintained on Baraclude; followed by Hepatitis clinic.  Marland Kitchen History of allergic rhinitis   . Severe aortic insufficiency 09/01/2004   s/p aortic valve replacement.      Past Surgical History:  Procedure Laterality Date  . AORTIC VALVE REPLACEMENT  4/06   24mm On-X mechanical valve  . CARDIAC CATHETERIZATION  11/15/2004   Ejection fraction is estimated 55%  . HEMORRHOID SURGERY      Family History  Problem Relation Age of Onset  . Heart disease Brother        valvular dysfunction; s/p replacement  . Hypertension Mother   . Hyperlipidemia Mother   . Hypothyroidism Mother   . Heart disease Father   . Hypertension Father   . Hyperlipidemia Father     Social History:  reports that she has never smoked. She has never used smokeless tobacco. She reports  that she does not drink alcohol or use drugs.  Allergies: No Known Allergies    Review of Systems  She has been treated with warfarin since her aortic valve replacement  Complaints of palpitations not present     Examination:   BP 102/60 (BP Location: Left Arm, Patient Position: Sitting, Cuff Size: Normal)   Pulse 86   Ht 5\' 4"  (1.626 m)   Wt 136 lb 9.6 oz (62 kg)   SpO2 98%   BMI 23.45 kg/m   Thyroid not palpable  Biceps reflexes are normal no skin or nail changes   Assessment/Plan:   HYPOTHYROIDISM, secondary to I-131 ablation for Graves' disease   She is on supplementation with levothyroxine since 01/2017  Has been taking 75 g of levothyroxine and is having consistently good results this and TSH is again quite normal  Discussed taking her iron tablet at least an hour or more after her levothyroxine in the morning  Since her levels are stable she can now start following up with her PCP   Elayne Snare 03/31/2018, 1:16 PM

## 2018-04-14 ENCOUNTER — Ambulatory Visit (INDEPENDENT_AMBULATORY_CARE_PROVIDER_SITE_OTHER): Payer: BLUE CROSS/BLUE SHIELD | Admitting: Pharmacist

## 2018-04-14 DIAGNOSIS — Z7901 Long term (current) use of anticoagulants: Secondary | ICD-10-CM

## 2018-04-14 DIAGNOSIS — I359 Nonrheumatic aortic valve disorder, unspecified: Secondary | ICD-10-CM | POA: Diagnosis not present

## 2018-04-14 DIAGNOSIS — Z952 Presence of prosthetic heart valve: Secondary | ICD-10-CM

## 2018-04-14 LAB — POCT INR: INR: 1.9 — AB (ref 2.0–3.0)

## 2018-04-21 ENCOUNTER — Telehealth: Payer: Self-pay

## 2018-04-21 DIAGNOSIS — D509 Iron deficiency anemia, unspecified: Secondary | ICD-10-CM

## 2018-04-21 NOTE — Telephone Encounter (Signed)
Copied from [place] [id number]. Topic: General - Other >> Apr 21, 2018 11:59 AM Oneta Rack wrote: Relation to pt: self Call back number: [phone]   Reason for call:  Patient states PCP wanted her to recheck her iron levels, patient requesting orders, please advise

## 2018-04-21 NOTE — Addendum Note (Signed)
Addended by: Lamar Blinks C on: 04/21/2018 04:57 PM   Modules accepted: Orders

## 2018-04-21 NOTE — Telephone Encounter (Signed)
Placed orders, called pt to let her know [facility]

## 2018-04-28 ENCOUNTER — Other Ambulatory Visit (INDEPENDENT_AMBULATORY_CARE_PROVIDER_SITE_OTHER): Payer: BLUE CROSS/BLUE SHIELD

## 2018-04-28 ENCOUNTER — Encounter: Payer: Self-pay | Admitting: Family Medicine

## 2018-04-28 DIAGNOSIS — R109 Unspecified abdominal pain: Secondary | ICD-10-CM | POA: Diagnosis not present

## 2018-04-28 DIAGNOSIS — D509 Iron deficiency anemia, unspecified: Secondary | ICD-10-CM

## 2018-04-28 DIAGNOSIS — Z09 Encounter for follow-up examination after completed treatment for conditions other than malignant neoplasm: Secondary | ICD-10-CM | POA: Diagnosis not present

## 2018-04-28 LAB — CBC
HCT: 40.4 % (ref 36.0–46.0)
HEMOGLOBIN: 13.2 g/dL (ref 12.0–15.0)
MCHC: 32.6 g/dL (ref 30.0–36.0)
MCV: 86.3 fl (ref 78.0–100.0)
PLATELETS: 227 10*3/uL (ref 150.0–400.0)
RBC: 4.69 Mil/uL (ref 3.87–5.11)
RDW: 21.1 % — ABNORMAL HIGH (ref 11.5–15.5)
WBC: 4.5 10*3/uL (ref 4.0–10.5)

## 2018-04-28 LAB — FERRITIN: Ferritin: 31.2 ng/mL (ref 10.0–291.0)

## 2018-05-05 ENCOUNTER — Telehealth: Payer: Self-pay | Admitting: Gastroenterology

## 2018-05-05 ENCOUNTER — Encounter: Payer: Self-pay | Admitting: Gastroenterology

## 2018-05-05 ENCOUNTER — Ambulatory Visit: Payer: BLUE CROSS/BLUE SHIELD | Admitting: Gastroenterology

## 2018-05-05 VITALS — BP 100/60 | HR 62 | Ht 64.0 in | Wt 134.0 lb

## 2018-05-05 DIAGNOSIS — D509 Iron deficiency anemia, unspecified: Secondary | ICD-10-CM | POA: Diagnosis not present

## 2018-05-05 DIAGNOSIS — K625 Hemorrhage of anus and rectum: Secondary | ICD-10-CM

## 2018-05-05 DIAGNOSIS — B181 Chronic viral hepatitis B without delta-agent: Secondary | ICD-10-CM | POA: Diagnosis not present

## 2018-05-05 MED ORDER — PEG 3350-KCL-NA BICARB-NACL 420 G PO SOLR: 4000.0000 mL | ORAL | 0 refills | Status: DC

## 2018-05-05 NOTE — Patient Instructions (Addendum)
You will be set up for a colonoscopy for rectal bleeding. Hold coumadin for 5 days prior to the colonoscopy. We will communicate with Dr. Peter Martinique (cardiology) about the safety of that recommendation. Please start taking citrucel (orange flavored) powder fiber supplement.  This may cause some bloating at first but that usually goes away. Begin with a small spoonful and work your way up to a large, heaping spoonful daily over a week.  Normal BMI (Body Mass Index- based on height and weight) is between 19 and 25. Your BMI today is Body mass index is 23 kg/m. Marland Kitchen Please consider follow up  regarding your BMI with your Primary Care Provider.

## 2018-05-05 NOTE — Telephone Encounter (Signed)
[facility] Pre-operative Risk Assessment     Request for surgical clearance:     Endoscopy Procedure  What type of surgery is being performed?     Colonoscopy  When is this surgery scheduled?     06/30/18  What type of clearance is required ?   Pharmacy  Are there any medications that need to be held prior to surgery and how long? Coumadin  Practice name and name of physician performing surgery?      [facility]  What is your office phone and fax number?      Phone- [phone]  [phone]  Anesthesia type (None, local, MAC, general) ?       MAC

## 2018-05-05 NOTE — Progress Notes (Signed)
HPI: This is a very pleasant 47 year old woman who was referred to me by Copland, Gay Filler, MD  to evaluate microcytic anemia.    Chief complaint is microcytic anemia, intermittent rectal bleeding  She has overt rectal bleeding a few times in past few months.    Her MGM had sotmach cancre.  She is not aware of any colon cancer in her family  She is on coumadin for heart valve.  Dr. Peter Martinique prescribes this for her from cardiology  She has constipation. Tired to drink more water and it sometimes helps.  She has never tried fiber supplements  Weight stable.  He has chronic hep B and is followed by the [facility] liver clinic here in town.  He is not felt to have cirrhosis.  Ultrasound 1 year ago showed no sign of cirrhosis.  He is on Baraclude  Old Data Reviewed:  Cbc 01/2018: hb 9.9, mcv 78 Cbc 04/2018: Hb 13.2, mcv 86 lfts 08/2017 normal.  Review of systems: Pertinent positive and negative review of systems were noted in the above HPI section. All other review negative.   Past Medical History:  Diagnosis Date  . Femur fracture, left ([place]) 1990   with open reduction and internal fixation   . H/O: rheumatic fever   . Hepatitis B carrier ([place])    positive hepatitis B carrier; maintained on Baraclude; followed by Hepatitis clinic.  Marland Kitchen History of allergic rhinitis   . Severe aortic insufficiency 09/01/2004   s/p aortic valve replacement.      Past Surgical History:  Procedure Laterality Date  . AORTIC VALVE REPLACEMENT  4/06   31mm On-X mechanical valve  . CARDIAC CATHETERIZATION  11/15/2004   Ejection fraction is estimated 55%  . HEMORRHOID SURGERY      Current Outpatient Medications  Medication Sig Dispense Refill  . entecavir (BARACLUDE) 1 MG tablet Take 1 mg by mouth daily.    . ferrous sulfate 325 (65 FE) MG tablet Take 1 tablet (325 mg total) by mouth 2 (two) times daily with a meal. 60 tablet 3  . levothyroxine (SYNTHROID, LEVOTHROID) 75 MCG tablet  TAKE 1 TABLET BY MOUTH ONCE DAILY BEFORE BREAKFAST 30 tablet 3  . warfarin (COUMADIN) 5 MG tablet Take 1.5 tablets by mouth daily or as directed by coumadin clinic 150 tablet 1   No current facility-administered medications for this visit.     Allergies as of 05/05/2018  . (No Known Allergies)    Family History  Problem Relation Age of Onset  . Heart disease Brother        valvular dysfunction; s/p replacement  . Hypertension Mother   . Hyperlipidemia Mother   . Hypothyroidism Mother   . Heart disease Father   . Hypertension Father   . Hyperlipidemia Father     Social History   Socioeconomic History  . Marital status: Married    Spouse name: Not on file  . Number of children: 3  . Years of education: Not on file  . Highest education level: Not on file  Occupational History  . Occupation: Tour manager: [employer]  Social Needs  . Financial resource strain: Not on file  . Food insecurity:    Worry: Not on file    Inability: Not on file  . Transportation needs:    Medical: Not on file    Non-medical: Not on file  Tobacco Use  . Smoking status: Never Smoker  . Smokeless tobacco:  Never Used  Substance and Sexual Activity  . Alcohol use: No    Alcohol/week: 0.0 standard drinks  . Drug use: No  . Sexual activity: Yes    Partners: Male  Lifestyle  . Physical activity:    Days per week: Not on file    Minutes per session: Not on file  . Stress: Not on file  Relationships  . Social connections:    Talks on phone: Not on file    Gets together: Not on file    Attends religious service: Not on file    Active member of club or organization: Not on file    Attends meetings of clubs or organizations: Not on file    Relationship status: Not on file  . Intimate partner violence:    Fear of current or ex partner: Not on file    Emotionally abused: Not on file    Physically abused: Not on file    Forced sexual activity: Not on file  Other Topics  Concern  . Not on file  Social History Narrative   Marital status: married x 18 years; happily married; no abuse.  From [place]; [place] since 1993.      Children: 3 boys (17, 100, 88)      Lives: with husband, 3 sons      Employment:  Geophysicist/field seismologist at [employer].      Tobacco: none      Alcohol: none      Drugs: none      Exercise: walking x 3-4 days; 30 minutes     Physical Exam: BP 100/60   Pulse 62   Ht 5\' 4"  (1.626 m)   Wt 134 lb (60.8 kg)   BMI 23.00 kg/m  Constitutional: generally well-appearing Psychiatric: alert and oriented x3 Eyes: extraocular movements intact Mouth: oral pharynx moist, no lesions Neck: supple no lymphadenopathy Cardiovascular: heart regular rate and rhythm Lungs: clear to auscultation bilaterally Abdomen: soft, nontender, nondistended, no obvious ascites, no peritoneal signs, normal bowel sounds Extremities: no lower extremity edema bilaterally Skin: no lesions on visible extremities Rectal exam deferred for upcoming colonoscopy  Assessment and plan: 47 y.o. female with microcytic anemia, intermittent rectal bleeding, constipation  I recommended we begin her work-up with a colonoscopy.  She is on Coumadin for an artificial heart valve and we will communicate with her cardiologist about the safety of that recommendation that she hold Coumadin for 5 days prior to the colonoscopy.  She will start fiber supplements mild, chronic constipation.  She should continue daily iron supplement for now.  Please see the "Patient Instructions" section for addition details about the plan.   Owens Loffler, MD [facility] 05/05/2018, 1:41 PM  Cc: Darreld Mclean, MD

## 2018-05-05 NOTE — Telephone Encounter (Signed)
Patient with diagnosis of aortic valve replacement (on-X), on warfarin for anticoagulation.    Procedure: colonoscopy Date of procedure: 06/30/18  CrCl 111.8 Platelet count 227  Per office protocol, patient can hold warfarin for 5 days prior to procedure.   Patient will not need bridging with Lovenox (enoxaparin) around procedure.  If not bridging, patient should restart warfarin on the evening of procedure or day after, at discretion of procedure MD  (note that anti-coag visits indicate a diagnosis of atrial fibrillation, but no medical notes indicate this.  Will remove diagnosis from future anti-coag visits.)

## 2018-05-06 NOTE — Telephone Encounter (Signed)
Spoke to patient to inform her that she has been cleared by her medical doctor to hold her Coumadin 5 days prior to colonoscopy 06/30/18. Patient voiced understanding.

## 2018-05-07 ENCOUNTER — Other Ambulatory Visit: Payer: Self-pay | Admitting: Nurse Practitioner

## 2018-05-07 DIAGNOSIS — B181 Chronic viral hepatitis B without delta-agent: Secondary | ICD-10-CM

## 2018-05-14 ENCOUNTER — Ambulatory Visit (INDEPENDENT_AMBULATORY_CARE_PROVIDER_SITE_OTHER): Payer: BLUE CROSS/BLUE SHIELD | Admitting: Pharmacist

## 2018-05-14 DIAGNOSIS — Z952 Presence of prosthetic heart valve: Secondary | ICD-10-CM

## 2018-05-14 DIAGNOSIS — Z7901 Long term (current) use of anticoagulants: Secondary | ICD-10-CM | POA: Diagnosis not present

## 2018-05-14 DIAGNOSIS — I359 Nonrheumatic aortic valve disorder, unspecified: Secondary | ICD-10-CM | POA: Diagnosis not present

## 2018-05-14 LAB — POCT INR: INR: 2.3 (ref 2.0–3.0)

## 2018-05-26 ENCOUNTER — Ambulatory Visit
Admission: RE | Admit: 2018-05-26 | Discharge: 2018-05-26 | Disposition: A | Discharge status: Home / Self Care | Payer: BLUE CROSS/BLUE SHIELD | Source: Ambulatory Visit | Attending: Nurse Practitioner | Admitting: Nurse Practitioner

## 2018-05-26 DIAGNOSIS — B1910 Unspecified viral hepatitis B without hepatic coma: Secondary | ICD-10-CM | POA: Diagnosis not present

## 2018-05-26 DIAGNOSIS — B181 Chronic viral hepatitis B without delta-agent: Secondary | ICD-10-CM

## 2018-06-02 ENCOUNTER — Ambulatory Visit (INDEPENDENT_AMBULATORY_CARE_PROVIDER_SITE_OTHER): Payer: BLUE CROSS/BLUE SHIELD | Admitting: Family Medicine

## 2018-06-02 DIAGNOSIS — Z23 Encounter for immunization: Secondary | ICD-10-CM | POA: Diagnosis not present

## 2018-06-03 DIAGNOSIS — G5602 Carpal tunnel syndrome, left upper limb: Secondary | ICD-10-CM | POA: Diagnosis not present

## 2018-06-03 DIAGNOSIS — S63641A Sprain of metacarpophalangeal joint of right thumb, initial encounter: Secondary | ICD-10-CM | POA: Diagnosis not present

## 2018-06-09 DIAGNOSIS — Z6823 Body mass index (BMI) 23.0-23.9, adult: Secondary | ICD-10-CM | POA: Diagnosis not present

## 2018-06-09 DIAGNOSIS — Z01419 Encounter for gynecological examination (general) (routine) without abnormal findings: Secondary | ICD-10-CM | POA: Diagnosis not present

## 2018-06-09 DIAGNOSIS — Z1231 Encounter for screening mammogram for malignant neoplasm of breast: Secondary | ICD-10-CM | POA: Diagnosis not present

## 2018-06-21 ENCOUNTER — Ambulatory Visit (INDEPENDENT_AMBULATORY_CARE_PROVIDER_SITE_OTHER): Payer: BLUE CROSS/BLUE SHIELD | Admitting: Pharmacist Clinician (PhC)/ Clinical Pharmacy Specialist

## 2018-06-21 DIAGNOSIS — I359 Nonrheumatic aortic valve disorder, unspecified: Secondary | ICD-10-CM | POA: Diagnosis not present

## 2018-06-21 DIAGNOSIS — Z7901 Long term (current) use of anticoagulants: Secondary | ICD-10-CM | POA: Diagnosis not present

## 2018-06-21 DIAGNOSIS — Z952 Presence of prosthetic heart valve: Secondary | ICD-10-CM | POA: Diagnosis not present

## 2018-06-21 LAB — POCT INR: INR: 2.2 (ref 2.0–3.0)

## 2018-06-21 NOTE — Patient Instructions (Signed)
Description   Take 1 tablet daily except 1.5 tablets each Monday, Wednesday, and Friday.  Repeat INR in 4 weeks.

## 2018-06-24 DIAGNOSIS — G8929 Other chronic pain: Secondary | ICD-10-CM | POA: Diagnosis not present

## 2018-06-24 DIAGNOSIS — M79644 Pain in right finger(s): Secondary | ICD-10-CM | POA: Diagnosis not present

## 2018-06-29 ENCOUNTER — Other Ambulatory Visit: Payer: Self-pay | Admitting: Orthopedic Surgery

## 2018-06-29 DIAGNOSIS — M79644 Pain in right finger(s): Secondary | ICD-10-CM

## 2018-06-30 ENCOUNTER — Encounter: Payer: Self-pay | Admitting: Gastroenterology

## 2018-06-30 ENCOUNTER — Ambulatory Visit (AMBULATORY_SURGERY_CENTER): Payer: BLUE CROSS/BLUE SHIELD | Admitting: Gastroenterology

## 2018-06-30 VITALS — BP 119/72 | HR 64 | Temp 98.6°F | Resp 18 | Ht 64.0 in | Wt 134.0 lb

## 2018-06-30 DIAGNOSIS — K621 Rectal polyp: Secondary | ICD-10-CM | POA: Diagnosis not present

## 2018-06-30 DIAGNOSIS — K648 Other hemorrhoids: Secondary | ICD-10-CM | POA: Diagnosis not present

## 2018-06-30 DIAGNOSIS — D128 Benign neoplasm of rectum: Secondary | ICD-10-CM

## 2018-06-30 DIAGNOSIS — D509 Iron deficiency anemia, unspecified: Secondary | ICD-10-CM

## 2018-06-30 DIAGNOSIS — K625 Hemorrhage of anus and rectum: Secondary | ICD-10-CM | POA: Diagnosis not present

## 2018-06-30 MED ORDER — SODIUM CHLORIDE 0.9 % IV SOLN: 500.0000 mL | INTRAVENOUS | Status: DC

## 2018-06-30 NOTE — Progress Notes (Signed)
Report given to PACU, vss 

## 2018-06-30 NOTE — Patient Instructions (Signed)
YOU HAD AN ENDOSCOPIC PROCEDURE TODAY AT THE [facility]:   Refer to the procedure report that was given to you for any specific questions about what was found during the examination.  If the procedure report does not answer your questions, please call your gastroenterologist to clarify.  If you requested that your care partner not be given the details of your procedure findings, then the procedure report has been included in a sealed envelope for you to review at your convenience later.  YOU SHOULD EXPECT: Some feelings of bloating in the abdomen. Passage of more gas than usual.  Walking can help get rid of the air that was put into your GI tract during the procedure and reduce the bloating. If you had a lower endoscopy (such as a colonoscopy or flexible sigmoidoscopy) you may notice spotting of blood in your stool or on the toilet paper. If you underwent a bowel prep for your procedure, you may not have a normal bowel movement for a few days.  Please Note:  You might notice some irritation and congestion in your nose or some drainage.  This is from the oxygen used during your procedure.  There is no need for concern and it should clear up in a day or so.  SYMPTOMS TO REPORT IMMEDIATELY:   Following lower endoscopy (colonoscopy or flexible sigmoidoscopy):  Excessive amounts of blood in the stool  Significant tenderness or worsening of abdominal pains  Swelling of the abdomen that is new, acute  Fever of 100F or higher  For urgent or emergent issues, a gastroenterologist can be reached at any hour by calling [phone].   DIET:  We do recommend a small meal at first, but then you may proceed to your regular diet.  Drink plenty of fluids but you should avoid alcoholic beverages for 24 hours.  ACTIVITY:  You should plan to take it easy for the rest of today and you should NOT DRIVE or use heavy machinery until tomorrow (because of the sedation medicines used during the test).     FOLLOW UP: Our staff will call the number listed on your records the next business day following your procedure to check on you and address any questions or concerns that you may have regarding the information given to you following your procedure. If we do not reach you, we will leave a message.  However, if you are feeling well and you are not experiencing any problems, there is no need to return our call.  We will assume that you have returned to your regular daily activities without incident.  If any biopsies were taken you will be contacted by phone or by letter within the next 1-3 weeks.  Please call us at [phone] if you have not heard about the biopsies in 3 weeks.    SIGNATURES/CONFIDENTIALITY: You and/or your care partner have signed paperwork which will be entered into your electronic medical record.  These signatures attest to the fact that that the information above on your After Visit Summary has been reviewed and is understood.  Full responsibility of the confidentiality of this discharge information lies with you and/or your care-partner. 

## 2018-06-30 NOTE — Progress Notes (Signed)
Called to room to assist during endoscopic procedure.  Patient ID and intended procedure confirmed with present staff. Received instructions for my participation in the procedure from the performing physician.  

## 2018-06-30 NOTE — Op Note (Signed)
[facility] Patient Name: Lynessa Almanzar Procedure Date: 06/30/2018 1:55 PM MRN: [id number] Endoscopist: Milus Banister , MD Age: 47 Referring MD:  Date of Birth: [birth year] Gender: Female Account #: [id number] Procedure:                Colonoscopy Indications:              Iron deficiency anemia Medicines:                Monitored Anesthesia Care Procedure:                Pre-Anesthesia Assessment:                           - Prior to the procedure, a History and Physical                            was performed, and patient medications and                            allergies were reviewed. The patient's tolerance of                            previous anesthesia was also reviewed. The risks                            and benefits of the procedure and the sedation                            options and risks were discussed with the patient.                            All questions were answered, and informed consent                            was obtained. Prior Anticoagulants: The patient has                            taken Coumadin (warfarin), last dose was 5 days                            prior to procedure. ASA Grade Assessment: III - A                            patient with severe systemic disease. After                            reviewing the risks and benefits, the patient was                            deemed in satisfactory condition to undergo the                            procedure.  After obtaining informed consent, the colonoscope                            was passed under direct vision. Throughout the                            procedure, the patient's blood pressure, pulse, and                            oxygen saturations were monitored continuously. The                            Colonoscope was introduced through the anus and                            advanced to the the cecum, identified by   appendiceal orifice and ileocecal valve. The                            colonoscopy was performed without difficulty. The                            patient tolerated the procedure well. The quality                            of the bowel preparation was good. The ileocecal                            valve, appendiceal orifice, and rectum were                            photographed. Scope In: 2:01:19 PM Scope Out: 2:13:24 PM Scope Withdrawal Time: 0 hours 9 minutes 13 seconds  Total Procedure Duration: 0 hours 12 minutes 5 seconds  Findings:                 A 3 mm polyp was found in the rectum. The polyp was                            sessile. The polyp was removed with a cold snare.                            Resection and retrieval were complete.                           Internal hemorrhoids were found. The hemorrhoids                            were small.                           The exam was otherwise without abnormality on                            direct and retroflexion views. Complications:  No immediate complications. Estimated blood loss:                            None. Estimated Blood Loss:     Estimated blood loss: none. Impression:               - One 3 mm polyp in the rectum, removed with a cold                            snare. Resected and retrieved.                           - Internal hemorrhoids.                           - Theses findings do not explain your iron                            deficiency anemia. Recommendation:           - Patient has a contact number available for                            emergencies. The signs and symptoms of potential                            delayed complications were discussed with the                            patient. Return to normal activities tomorrow.                            Written discharge instructions were provided to the                            patient.                           - Resume  previous diet.                           - Continue present medications.                           You will receive a letter within 2-3 weeks with the                            pathology results and my final recommendations.                           If the polyp(s) is proven to be 'pre-cancerous' on                            pathology, you will need repeat colonoscopy in 5  years. If the polyp(s) is NOT 'precancerous' on                            pathology then you should repeat colon cancer                            screening in 10 years with colonoscopy without need                            for colon cancer screening by any method prior to                            then (including stool testing). Pending that                            result, you will likely be scheduled for an upper                            endoscopy. Milus Banister, MD 06/30/2018 2:17:09 PM This report has been signed electronically.

## 2018-07-01 ENCOUNTER — Telehealth: Payer: Self-pay

## 2018-07-01 NOTE — Telephone Encounter (Signed)
  Follow up Call-  Call back number 06/30/2018  Post procedure Call Back phone  # [phone]  Permission to leave phone message Yes  Some recent data might be hidden     Patient questions:  Do you have a fever, pain , or abdominal swelling? No. Pain Score  0 *  Have you tolerated food without any problems? Yes.    Have you been able to return to your normal activities? Yes.    Do you have any questions about your discharge instructions: Diet   No. Medications  No. Follow up visit  No.  Do you have questions or concerns about your Care? No.  Actions: * If pain score is 4 or above: No action needed, pain <4.

## 2018-07-02 ENCOUNTER — Ambulatory Visit
Admission: RE | Admit: 2018-07-02 | Discharge: 2018-07-02 | Disposition: A | Discharge status: Home / Self Care | Payer: BLUE CROSS/BLUE SHIELD | Source: Ambulatory Visit | Attending: Orthopedic Surgery | Admitting: Orthopedic Surgery

## 2018-07-02 DIAGNOSIS — S63642A Sprain of metacarpophalangeal joint of left thumb, initial encounter: Secondary | ICD-10-CM | POA: Diagnosis not present

## 2018-07-02 DIAGNOSIS — M79644 Pain in right finger(s): Secondary | ICD-10-CM

## 2018-07-13 DIAGNOSIS — S5332XA Traumatic rupture of left ulnar collateral ligament, initial encounter: Secondary | ICD-10-CM | POA: Diagnosis not present

## 2018-07-13 DIAGNOSIS — G8929 Other chronic pain: Secondary | ICD-10-CM | POA: Diagnosis not present

## 2018-07-13 DIAGNOSIS — M79644 Pain in right finger(s): Secondary | ICD-10-CM | POA: Diagnosis not present

## 2018-07-15 ENCOUNTER — Telehealth: Payer: Self-pay

## 2018-07-15 NOTE — Telephone Encounter (Signed)
   [facility] Pre-operative Risk Assessment    Request for surgical clearance:  1. What type of surgery is being performed? Right thumb UCL repair  2. When is this surgery scheduled? 07/21/18  3. What type of clearance is required (medical clearance vs. Pharmacy clearance to hold med vs. Both)? Both  4. Are there any medications that need to be held prior to surgery and how long? Warfarin  5. Practice name and name of physician performing surgery? [facility]   6. What is your office phone number [phone]   7.   What is your office fax number [phone]  8.   Anesthesia type General   Kathyrn Lass 07/15/2018, 3:54 PM  _________________________________________________________________   (provider comments below)

## 2018-07-15 NOTE — Telephone Encounter (Signed)
Pharm please address coumadin. thanks 

## 2018-07-16 NOTE — Telephone Encounter (Signed)
   Primary Cardiologist: Peter Martinique, MD  Chart reviewed as part of pre-operative protocol coverage. Patient was contacted 07/16/2018 in reference to pre-operative risk assessment for pending surgery as outlined below.  [name] was last seen on 03/10/18 by Dr. Jory Sims NP .  Since that day, [name] has done well without chest pain or SOB and is able to be active to 4 METS.  As per Dr. Martinique ok to hold warfarin for 5 days without lovenox bridge prior to procedure.    Therefore, based on ACC/AHA guidelines, the patient would be at acceptable risk for the planned procedure without further cardiovascular testing.   I will route this recommendation to the requesting party via Epic fax function and remove from pre-op pool.  Please call with questions.  Cecilie Kicks, NP 07/16/2018, 3:48 PM

## 2018-07-16 NOTE — Telephone Encounter (Signed)
No history of Afib to my knowledge. Should not need bridging.  Peter Martinique MD, [facility]

## 2018-07-16 NOTE — Telephone Encounter (Signed)
Pt takes warfarin for mechanical AVR, no mention of afib in [facility] or cardiology notes. Anticoag notes have higher INR range 2.5-3.5 with hx of afib (resolved). Pt has only had 1 EKG in the past 2 years however she was not in afib at that time or during any of her 2017 EKGs. Will route to Dr Martinique for clarification - if she does not currently have afib, she will not require bridging with Lovenox while off of her warfarin for 5 days and her INR range would need to be lowered to 2-3 for mechanical AVR without other risk factors. If there is still a concern for afib, would keep INR range 2.5-3.5 and pt would require Lovenox bridging.

## 2018-07-16 NOTE — Telephone Encounter (Signed)
Pt ok to hold warfarin for 5 days without Lovenox bridge prior to procedure. Will route to [facility] as INR range will need to be decreased to 2-3 at her next visit.

## 2018-07-16 NOTE — Telephone Encounter (Signed)
Follow up:   Patient returning call from yesterday concerning pre op letter.

## 2018-07-16 NOTE — Telephone Encounter (Signed)
Called pt to clear, and left message for her to call back.

## 2018-07-19 NOTE — Telephone Encounter (Signed)
refaxed cardiac clearance to [facility].

## 2018-07-19 NOTE — Telephone Encounter (Signed)
Follow up     [name] with [facility] is calling to advised that they have not received the pre-op clearance. Please fax [phone].

## 2018-07-20 ENCOUNTER — Ambulatory Visit (INDEPENDENT_AMBULATORY_CARE_PROVIDER_SITE_OTHER): Payer: BLUE CROSS/BLUE SHIELD | Admitting: Pharmacist Clinician (PhC)/ Clinical Pharmacy Specialist

## 2018-07-20 DIAGNOSIS — Z952 Presence of prosthetic heart valve: Secondary | ICD-10-CM

## 2018-07-20 DIAGNOSIS — Z7901 Long term (current) use of anticoagulants: Secondary | ICD-10-CM | POA: Diagnosis not present

## 2018-07-20 DIAGNOSIS — I359 Nonrheumatic aortic valve disorder, unspecified: Secondary | ICD-10-CM

## 2018-07-20 LAB — POCT INR: INR: 3.4 — AB (ref 2.0–3.0)

## 2018-07-20 NOTE — Patient Instructions (Signed)
Description   No warfarin today Tuesday Nov 19, then decrease dose to 1 tablet daily except 1.5 tablets each Monday and Friday.  Repeat INR in 3 weeks

## 2018-07-21 DIAGNOSIS — S5331XA Traumatic rupture of right ulnar collateral ligament, initial encounter: Secondary | ICD-10-CM | POA: Diagnosis not present

## 2018-07-21 DIAGNOSIS — Y999 Unspecified external cause status: Secondary | ICD-10-CM | POA: Diagnosis not present

## 2018-07-26 DIAGNOSIS — M79644 Pain in right finger(s): Secondary | ICD-10-CM | POA: Diagnosis not present

## 2018-07-26 DIAGNOSIS — G8929 Other chronic pain: Secondary | ICD-10-CM | POA: Diagnosis not present

## 2018-08-08 NOTE — Progress Notes (Addendum)
[place] at [facility] [address] [place], [place] [address] [phone]  Date:  08/11/2018   Name:  [name]   DOB:  07-18-1971   MRN:  [id number]  PCP:  Darreld Mclean, MD    Chief Complaint: Hypothyroidism (4 month follow up) and Lab Work   History of Present Illness:  [name] is a 47 y.o. very pleasant female patient who presents with the following:  Periodic follow-up visit today History of hypothyroidism, AVR 2/to rheumatic fever on anticoagulation, hep B carrier She had recent right hand surgery per Dr. Burney Gauze about 2 weeks ago.  Seems to be making good progress   I last saw her in June She attends coumadin clinic- she will follow-up next week  Dr. Dwyane Dee manages her thyroid  colonoscopy done in October  He reports that her GI doc wanted to do an upper GI.  However she is not sure if she wants to do this.  Asked for my opinion- advised patient that I am sure her gastroenterologist had a reason for ordering this test, so I would have to leave it up to her discretion. Pap: done per Dr. Charlesetta Garibaldi   Lab Results  Component Value Date   INR 3.4 (A) 07/20/2018   INR 2.2 06/21/2018   INR 2.3 05/14/2018   She is not fasting today History of iron deficiency as well She is taking iron every other day right now  No SE noted   Patient Active Problem List   Diagnosis Date Noted  . Hypothyroidism, postradioiodine therapy 02/04/2017  . Vasovagal syncope 05/02/2015  . Hepatitis B 04/28/2012  . Aortic valve disorder 02/11/2012  . Long term (current) use of anticoagulants 02/11/2012  . S/P AVR 12/16/2011  . Chronic anticoagulation 12/16/2011  . PULMONARY NODULE 01/31/2010  . DYSPNEA 01/31/2010    Past Medical History:  Diagnosis Date  . Femur fracture, left ([place]) 1990   with open reduction and internal fixation   . H/O: rheumatic fever   . Hepatitis B carrier ([place])    positive hepatitis B carrier;  maintained on Baraclude; followed by Hepatitis clinic.  Marland Kitchen History of allergic rhinitis   . Severe aortic insufficiency 09/01/2004   s/p aortic valve replacement.      Past Surgical History:  Procedure Laterality Date  . AORTIC VALVE REPLACEMENT  4/06   19mm On-X mechanical valve  . CARDIAC CATHETERIZATION  11/15/2004   Ejection fraction is estimated 55%  . HEMORRHOID SURGERY      Social History   Tobacco Use  . Smoking status: Never Smoker  . Smokeless tobacco: Never Used  Substance Use Topics  . Alcohol use: No    Alcohol/week: 0.0 standard drinks  . Drug use: No    Family History  Problem Relation Age of Onset  . Heart disease Brother        valvular dysfunction; s/p replacement  . Hypertension Mother   . Hyperlipidemia Mother   . Hypothyroidism Mother   . Heart disease Father   . Hypertension Father   . Hyperlipidemia Father   . Stomach cancer Maternal Grandmother   . Colon cancer Neg Hx   . Rectal cancer Neg Hx   . Colon polyps Neg Hx     No Known Allergies  Medication list has been reviewed and updated.  Current Outpatient Medications on File Prior to Visit  Medication Sig Dispense Refill  . entecavir (BARACLUDE) 1 MG  tablet Take 1 mg by mouth daily.    . ferrous sulfate 325 (65 FE) MG tablet Take 1 tablet (325 mg total) by mouth 2 (two) times daily with a meal. 60 tablet 3  . levothyroxine (SYNTHROID, LEVOTHROID) 75 MCG tablet TAKE 1 TABLET BY MOUTH ONCE DAILY BEFORE BREAKFAST 30 tablet 3  . warfarin (COUMADIN) 5 MG tablet Take 1.5 tablets by mouth daily or as directed by coumadin clinic 150 tablet 1   No current facility-administered medications on file prior to visit.     Review of Systems:  As per HPI- otherwise negative. No fever or chills No CP or SOB   Physical Examination: Vitals:   08/11/18 1115  BP: 104/70  Pulse: 82  Resp: 16  Temp: (!) 97.4 F (36.3 C)  SpO2: 100%   Vitals:   08/11/18 1115  Weight: 137 lb 9.6 oz (62.4 kg)   Height: 5\' 4"  (1.626 m)   Body mass index is 23.62 kg/m. Ideal Body Weight: Weight in (lb) to have BMI = 25: 145.3  GEN: WDWN, NAD, Non-toxic, A & O x 3, normal weight, looks well  HEENT: Atraumatic, Normocephalic. Neck supple. No masses, No LAD. Ears and Nose: No external deformity. CV: RRR, s/p AVR, No JVD. No thrill. No extra heart sounds. PULM: CTA B, no wheezes, crackles, rhonchi. No retractions. No resp. distress. No accessory muscle use. ABD: S, NT, ND EXTR: No c/c/e NEURO Normal gait.  PSYCH: Normally interactive. Conversant. Not depressed or anxious appearing.  Calm demeanor.    Assessment and Plan: Iron deficiency anemia, unspecified iron deficiency anemia type - Plan: CBC, Ferritin  Acquired hypothyroidism  Leukopenia, unspecified type  Long term (current) use of anticoagulants  Screening for diabetes mellitus - Plan: Comprehensive metabolic panel, Hemoglobin A1c  Screening for hyperlipidemia - Plan: Lipid panel   Following up today.  We will check her labs as above.  She is taking iron every other day at this time, will see how her ferritin looks.  Blood pressures under good control.  We will be in touch with her pending her lab results, plan to revisit in 6 months. Signed Lamar Blinks, MD  Received her labs, letter to pt  Results for orders placed or performed in visit on 08/11/18  CBC  Result Value Ref Range   WBC 5.1 4.0 - 10.5 K/uL   RBC 4.40 3.87 - 5.11 Mil/uL   Platelets 272.0 150.0 - 400.0 K/uL   Hemoglobin 13.2 12.0 - 15.0 g/dL   HCT 39.6 36.0 - 46.0 %   MCV 90.0 78.0 - 100.0 fl   MCHC 33.4 30.0 - 36.0 g/dL   RDW 14.2 11.5 - 15.5 %  Comprehensive metabolic panel  Result Value Ref Range   Sodium 142 135 - 145 mEq/L   Potassium 4.8 3.5 - 5.1 mEq/L   Chloride 107 96 - 112 mEq/L   CO2 28 19 - 32 mEq/L   Glucose, Bld 67 (L) 70 - 99 mg/dL   BUN 11 6 - 23 mg/dL   Creatinine, Ser 0.57 0.40 - 1.20 mg/dL   Total Bilirubin 0.9 0.2 - 1.2 mg/dL    Alkaline Phosphatase 40 39 - 117 U/L   AST 18 0 - 37 U/L   ALT 12 0 - 35 U/L   Total Protein 7.3 6.0 - 8.3 g/dL   Albumin 4.3 3.5 - 5.2 g/dL   Calcium 9.5 8.4 - 10.5 mg/dL   GFR 121.49 >60.00 mL/min  Lipid panel  Result Value  Ref Range   Cholesterol 171 0 - 200 mg/dL   Triglycerides 122.0 0.0 - 149.0 mg/dL   HDL 54.50 >39.00 mg/dL   VLDL 24.4 0.0 - 40.0 mg/dL   LDL Cholesterol 92 0 - 99 mg/dL   Total CHOL/HDL Ratio 3    NonHDL 116.75   Hemoglobin A1c  Result Value Ref Range   Hgb A1c MFr Bld 5.3 4.6 - 6.5 %  Ferritin  Result Value Ref Range   Ferritin 34.5 10.0 - 291.0 ng/mL

## 2018-08-11 ENCOUNTER — Encounter: Payer: BLUE CROSS/BLUE SHIELD | Admitting: Gastroenterology

## 2018-08-11 ENCOUNTER — Encounter: Payer: Self-pay | Admitting: Family Medicine

## 2018-08-11 ENCOUNTER — Ambulatory Visit: Payer: BLUE CROSS/BLUE SHIELD | Admitting: Family Medicine

## 2018-08-11 VITALS — BP 104/70 | HR 82 | Temp 97.4°F | Resp 16 | Ht 64.0 in | Wt 137.6 lb

## 2018-08-11 DIAGNOSIS — D72819 Decreased white blood cell count, unspecified: Secondary | ICD-10-CM

## 2018-08-11 DIAGNOSIS — E039 Hypothyroidism, unspecified: Secondary | ICD-10-CM | POA: Diagnosis not present

## 2018-08-11 DIAGNOSIS — D509 Iron deficiency anemia, unspecified: Secondary | ICD-10-CM | POA: Diagnosis not present

## 2018-08-11 DIAGNOSIS — Z13220 Encounter for screening for lipoid disorders: Secondary | ICD-10-CM

## 2018-08-11 DIAGNOSIS — Z7901 Long term (current) use of anticoagulants: Secondary | ICD-10-CM

## 2018-08-11 DIAGNOSIS — Z131 Encounter for screening for diabetes mellitus: Secondary | ICD-10-CM

## 2018-08-11 LAB — COMPREHENSIVE METABOLIC PANEL
ALBUMIN: 4.3 g/dL (ref 3.5–5.2)
ALT: 12 U/L (ref 0–35)
AST: 18 U/L (ref 0–37)
Alkaline Phosphatase: 40 U/L (ref 39–117)
BILIRUBIN TOTAL: 0.9 mg/dL (ref 0.2–1.2)
BUN: 11 mg/dL (ref 6–23)
CALCIUM: 9.5 mg/dL (ref 8.4–10.5)
CHLORIDE: 107 meq/L (ref 96–112)
CO2: 28 meq/L (ref 19–32)
CREATININE: 0.57 mg/dL (ref 0.40–1.20)
GFR: 121.49 mL/min (ref >60.00)
Glucose, Bld: 67 mg/dL — ABNORMAL LOW (ref 70–99)
POTASSIUM: 4.8 meq/L (ref 3.5–5.1)
Sodium: 142 mEq/L (ref 135–145)
TOTAL PROTEIN: 7.3 g/dL (ref 6.0–8.3)

## 2018-08-11 LAB — LIPID PANEL
Cholesterol: 171 mg/dL (ref 0–200)
HDL: 54.5 mg/dL (ref >39.00)
LDL CALC: 92 mg/dL (ref 0–99)
NonHDL: 116.75
Total CHOL/HDL Ratio: 3
Triglycerides: 122 mg/dL (ref 0.0–149.0)
VLDL: 24.4 mg/dL (ref 0.0–40.0)

## 2018-08-11 LAB — CBC
HEMATOCRIT: 39.6 % (ref 36.0–46.0)
HEMOGLOBIN: 13.2 g/dL (ref 12.0–15.0)
MCHC: 33.4 g/dL (ref 30.0–36.0)
MCV: 90 fl (ref 78.0–100.0)
PLATELETS: 272 10*3/uL (ref 150.0–400.0)
RBC: 4.4 Mil/uL (ref 3.87–5.11)
RDW: 14.2 % (ref 11.5–15.5)
WBC: 5.1 10*3/uL (ref 4.0–10.5)

## 2018-08-11 LAB — HEMOGLOBIN A1C: Hgb A1c MFr Bld: 5.3 % (ref 4.6–6.5)

## 2018-08-11 LAB — FERRITIN: Ferritin: 34.5 ng/mL (ref 10.0–291.0)

## 2018-08-11 NOTE — Patient Instructions (Signed)
Good to see you today- I'll be in touch with your labs asap We can plan to visit in 6 months

## 2018-08-16 ENCOUNTER — Ambulatory Visit (INDEPENDENT_AMBULATORY_CARE_PROVIDER_SITE_OTHER): Payer: BLUE CROSS/BLUE SHIELD | Admitting: Pharmacist

## 2018-08-16 DIAGNOSIS — Z7901 Long term (current) use of anticoagulants: Secondary | ICD-10-CM | POA: Diagnosis not present

## 2018-08-16 DIAGNOSIS — I359 Nonrheumatic aortic valve disorder, unspecified: Secondary | ICD-10-CM | POA: Diagnosis not present

## 2018-08-16 DIAGNOSIS — Z952 Presence of prosthetic heart valve: Secondary | ICD-10-CM

## 2018-08-16 LAB — POCT INR: INR: 2.5 (ref 2.0–3.0)

## 2018-08-18 ENCOUNTER — Other Ambulatory Visit: Payer: Self-pay

## 2018-08-18 MED ORDER — WARFARIN SODIUM 5 MG PO TABS: ORAL_TABLET | ORAL | 1 refills | Status: DC

## 2018-08-19 ENCOUNTER — Other Ambulatory Visit: Payer: Self-pay | Admitting: Endocrinology

## 2018-09-15 ENCOUNTER — Ambulatory Visit (INDEPENDENT_AMBULATORY_CARE_PROVIDER_SITE_OTHER): Payer: BLUE CROSS/BLUE SHIELD | Admitting: Pharmacist

## 2018-09-15 DIAGNOSIS — Z952 Presence of prosthetic heart valve: Secondary | ICD-10-CM

## 2018-09-15 DIAGNOSIS — I359 Nonrheumatic aortic valve disorder, unspecified: Secondary | ICD-10-CM | POA: Diagnosis not present

## 2018-09-15 DIAGNOSIS — Z7901 Long term (current) use of anticoagulants: Secondary | ICD-10-CM | POA: Diagnosis not present

## 2018-09-15 LAB — POCT INR: INR: 2.4 (ref 2.0–3.0)

## 2018-09-21 ENCOUNTER — Telehealth: Payer: Self-pay

## 2018-09-21 NOTE — Telephone Encounter (Signed)
   [facility] Pre-operative Risk Assessment    Request for surgical clearance:  1. What type of surgery is being performed? Periodontal exam and surgical therapy,Dental Implants  2. When is this surgery scheduled? TBD  What type of clearance is required (medical clearance vs. Pharmacy clearance to hold med vs. Both)? Pharmacy  3. Are there any medications that need to be held prior to surgery and how long? Warfarin   4. Practice name and name of physician performing surgery? [facility]  Practice Limited To Periodontics Implants   5. What is your office phone number [phone]   7.   What is your office fax number [phone]  8.   Anesthesia type Not listed   [name] 09/21/2018, 2:27 PM  _________________________________________________________________   (provider comments below)

## 2018-09-22 NOTE — Telephone Encounter (Signed)
Patient with diagnosis of mechanical AVR on warfarin for anticoagulation.    Called dental office - spoke with Estill Bamberg who states that patient will have implants, 2 teeth, no extractions needed.   Recommend against holding anticoagulation for 2 dental implants, but can check and INR the day before or day of procedure if needed.

## 2018-09-22 NOTE — Telephone Encounter (Signed)
   Primary Cardiologist: Peter Martinique, MD  Chart reviewed as part of pre-operative protocol coverage. Simple dental extractions/implantations are considered low risk procedures per guidelines and generally do not require any specific cardiac clearance. It is also generally accepted that for simple extractions/implantations, there is no need to interrupt blood thinner therapy.   Per pharmacy recommendations, patient should not hold coumadin prior to upcoming dental implant procedure.   I will route this recommendation to the requesting party via Epic fax function and remove from pre-op pool.  Please call with questions.  Abigail Butts, PA-C 09/22/2018, 2:57 PM

## 2018-11-02 ENCOUNTER — Other Ambulatory Visit: Payer: Self-pay | Admitting: Endocrinology

## 2018-11-02 NOTE — Telephone Encounter (Signed)
So do you want the pt's appointment for 04/06/2019 to be canceled?

## 2018-11-02 NOTE — Telephone Encounter (Signed)
Your last note stated to begin following up with PCP, but the patient has a current f/u appt for August of 2020. Do you want to refill or have PCP do this?

## 2018-11-02 NOTE — Telephone Encounter (Signed)
After she makes appointment with PCP

## 2018-11-02 NOTE — Telephone Encounter (Signed)
Deferred to PCP: Her PCP had agreed to take over refills and follow-up

## 2018-11-02 NOTE — Telephone Encounter (Signed)
Would you please call this pt and inform her that she needs to make a follow up appointment with her PCP since he/she will now be managing her thyroid and the refills must come from the PCP as well.  Once this is done, upcoming appt for 04/06/2019 with this office should be canceled.

## 2018-11-06 NOTE — Progress Notes (Deleted)
[place] at [facility] [address], [place], [place] [address] [phone]  Date:  11/10/2018   Name:  [name]   DOB:  06/01/1971   MRN:  [id number]  PCP:  Darreld Mclean, MD    Chief Complaint: No chief complaint on file.   History of Present Illness:  [name] is a 48 y.o. very pleasant female patient who presents with the following: History of hypothyroidism, AVR due to rheumatic fever on anticoagulation, hep B carrier  Following up on her thyroid today Last seen here in December  Ferritin looked ok at that time   Her thyroid was being managed by [facility] Lab Results  Component Value Date   TSH 1.76 02/10/2018      Patient Active Problem List   Diagnosis Date Noted  . Hypothyroidism, postradioiodine therapy 02/04/2017  . Vasovagal syncope 05/02/2015  . Hepatitis B 04/28/2012  . Aortic valve disorder 02/11/2012  . Long term (current) use of anticoagulants 02/11/2012  . S/P AVR 12/16/2011  . Chronic anticoagulation 12/16/2011  . PULMONARY NODULE 01/31/2010  . DYSPNEA 01/31/2010    Past Medical History:  Diagnosis Date  . Femur fracture, left ([place]) 1990   with open reduction and internal fixation   . H/O: rheumatic fever   . Hepatitis B carrier ([place])    positive hepatitis B carrier; maintained on Baraclude; followed by Hepatitis clinic.  Marland Kitchen History of allergic rhinitis   . Severe aortic insufficiency 09/01/2004   s/p aortic valve replacement.      Past Surgical History:  Procedure Laterality Date  . AORTIC VALVE REPLACEMENT  4/06   14mm On-X mechanical valve  . CARDIAC CATHETERIZATION  11/15/2004   Ejection fraction is estimated 55%  . HEMORRHOID SURGERY      Social History   Tobacco Use  . Smoking status: Never Smoker  . Smokeless tobacco: Never Used  Substance Use Topics  . Alcohol use: No    Alcohol/week: 0.0 standard drinks  . Drug use: No    Family History  Problem Relation Age of  Onset  . Heart disease Brother        valvular dysfunction; s/p replacement  . Hypertension Mother   . Hyperlipidemia Mother   . Hypothyroidism Mother   . Heart disease Father   . Hypertension Father   . Hyperlipidemia Father   . Stomach cancer Maternal Grandmother   . Colon cancer Neg Hx   . Rectal cancer Neg Hx   . Colon polyps Neg Hx     No Known Allergies  Medication list has been reviewed and updated.  Current Outpatient Medications on File Prior to Visit  Medication Sig Dispense Refill  . entecavir (BARACLUDE) 1 MG tablet Take 1 mg by mouth daily.    . ferrous sulfate 325 (65 FE) MG tablet Take 1 tablet (325 mg total) by mouth 2 (two) times daily with a meal. 60 tablet 3  . levothyroxine (SYNTHROID, LEVOTHROID) 75 MCG tablet TAKE 1 TABLET BY MOUTH ONCE DAILY BEFORE BREAKFAST 90 tablet 0  . warfarin (COUMADIN) 5 MG tablet Take 1.5 tablets by mouth daily or as directed by coumadin clinic 150 tablet 1   No current facility-administered medications on file prior to visit.     Review of Systems:  As per HPI- otherwise negative.   Physical Examination: There were no vitals filed for this visit. There were no vitals filed for this visit. There is  no height or weight on file to calculate BMI. Ideal Body Weight:    GEN: WDWN, NAD, Non-toxic, A & O x 3 HEENT: Atraumatic, Normocephalic. Neck supple. No masses, No LAD. Ears and Nose: No external deformity. CV: RRR, No M/G/R. No JVD. No thrill. No extra heart sounds. PULM: CTA B, no wheezes, crackles, rhonchi. No retractions. No resp. distress. No accessory muscle use. ABD: S, NT, ND, +BS. No rebound. No HSM. EXTR: No c/c/e NEURO Normal gait.  PSYCH: Normally interactive. Conversant. Not depressed or anxious appearing.  Calm demeanor.    Assessment and Plan: ***  Signed Lamar Blinks, MD

## 2018-11-10 ENCOUNTER — Ambulatory Visit: Payer: BLUE CROSS/BLUE SHIELD | Admitting: Family Medicine

## 2018-11-10 ENCOUNTER — Telehealth: Payer: Self-pay

## 2018-11-10 NOTE — Telephone Encounter (Signed)
Called and left a detailed message regarding overdue inr

## 2018-11-11 ENCOUNTER — Telehealth: Payer: Self-pay | Admitting: Endocrinology

## 2018-11-11 NOTE — Telephone Encounter (Signed)
Pt has [employer]	[place] now and has changed provider per PCP notes. August 2020 appointment cancelled

## 2018-11-17 ENCOUNTER — Other Ambulatory Visit: Payer: Self-pay

## 2018-11-17 ENCOUNTER — Ambulatory Visit (INDEPENDENT_AMBULATORY_CARE_PROVIDER_SITE_OTHER): Payer: BLUE CROSS/BLUE SHIELD | Admitting: Pharmacist Clinician (PhC)/ Clinical Pharmacy Specialist

## 2018-11-17 DIAGNOSIS — Z952 Presence of prosthetic heart valve: Secondary | ICD-10-CM

## 2018-11-17 DIAGNOSIS — I359 Nonrheumatic aortic valve disorder, unspecified: Secondary | ICD-10-CM | POA: Diagnosis not present

## 2018-11-17 DIAGNOSIS — Z7901 Long term (current) use of anticoagulants: Secondary | ICD-10-CM | POA: Diagnosis not present

## 2018-11-17 LAB — POCT INR: INR: 1.5 — AB (ref 2.0–3.0)

## 2018-12-07 ENCOUNTER — Telehealth: Payer: Self-pay

## 2018-12-07 NOTE — Telephone Encounter (Signed)

## 2018-12-08 ENCOUNTER — Other Ambulatory Visit: Payer: Self-pay

## 2018-12-08 ENCOUNTER — Ambulatory Visit (INDEPENDENT_AMBULATORY_CARE_PROVIDER_SITE_OTHER): Payer: BLUE CROSS/BLUE SHIELD | Admitting: Pharmacist

## 2018-12-08 DIAGNOSIS — Z952 Presence of prosthetic heart valve: Secondary | ICD-10-CM

## 2018-12-08 DIAGNOSIS — I359 Nonrheumatic aortic valve disorder, unspecified: Secondary | ICD-10-CM | POA: Diagnosis not present

## 2018-12-08 DIAGNOSIS — Z7901 Long term (current) use of anticoagulants: Secondary | ICD-10-CM | POA: Diagnosis not present

## 2018-12-08 LAB — POCT INR: INR: 2.5 (ref 2.0–3.0)

## 2019-03-08 ENCOUNTER — Other Ambulatory Visit: Payer: Self-pay | Admitting: Family Medicine

## 2019-03-08 DIAGNOSIS — D509 Iron deficiency anemia, unspecified: Secondary | ICD-10-CM

## 2019-03-16 ENCOUNTER — Telehealth: Payer: Self-pay

## 2019-03-16 NOTE — Telephone Encounter (Signed)
LMOMED OVER DUE COUMADIN

## 2019-04-06 ENCOUNTER — Ambulatory Visit: Payer: BLUE CROSS/BLUE SHIELD | Admitting: Endocrinology

## 2019-04-07 ENCOUNTER — Telehealth: Payer: Self-pay

## 2019-04-07 NOTE — Telephone Encounter (Signed)
lmom the 2nd time for overdue inr

## 2019-04-11 ENCOUNTER — Telehealth: Payer: Self-pay

## 2019-04-11 NOTE — Telephone Encounter (Signed)
lmom overdue inr °

## 2019-07-13 ENCOUNTER — Other Ambulatory Visit: Payer: Self-pay

## 2019-07-13 NOTE — Telephone Encounter (Signed)
lmom for refill needing poct inr check

## 2019-07-14 NOTE — Telephone Encounter (Signed)
2nd lmom for overdue inr 

## 2019-07-15 MED ORDER — WARFARIN SODIUM 5 MG PO TABS: ORAL_TABLET | ORAL | 0 refills | Status: DC

## 2019-07-15 NOTE — Telephone Encounter (Signed)
3rd message. Patient to call back and reschedule appt for INR check prior to next refill request

## 2019-09-09 ENCOUNTER — Telehealth: Payer: Self-pay | Admitting: *Deleted

## 2019-09-09 NOTE — Telephone Encounter (Signed)
Spoke with pt. Insurance has changed and Dr Lorelei Pont is now covered. She would like to re-establish care. Also states she is due for lab work. Scheduled Virtual Visit with Dr Lorelei Pont on 09/13/18 at [facility]. Pt requests appt on Wednesday.

## 2019-09-09 NOTE — Telephone Encounter (Signed)
Copied from [place] [id number]. Topic: General - Inquiry >> Sep 08, 2019  2:44 PM Richardo Priest, NT wrote: Reason for CRM: Pt called in stating she would like to have a lab order placed to check her iron and thyroid. Please advise.

## 2019-09-11 NOTE — Progress Notes (Deleted)
[place] at [facility] [address], [place], [place] [address] [phone]  Date:  09/14/2019   Name:  [name]   DOB:  [birth year]   MRN:  [id number]  PCP:  Darreld Mclean, MD    Chief Complaint: No chief complaint on file.   History of Present Illness:  [name] is a 49 y.o. very pleasant female patient who presents with the following:  Virtual visit today for routine follow-up Last seen by myself in 2019 History of hypothyroidism, aortic valve regurg due to rheumatic fever on anticoagulation, chronic hepatitis B carrier  Patient Active Problem List   Diagnosis Date Noted  . Hypothyroidism, postradioiodine therapy 02/04/2017  . Vasovagal syncope 05/02/2015  . Hepatitis B 04/28/2012  . Aortic valve disorder 02/11/2012  . Long term (current) use of anticoagulants 02/11/2012  . S/P AVR 12/16/2011  . Chronic anticoagulation 12/16/2011  . PULMONARY NODULE 01/31/2010  . DYSPNEA 01/31/2010    Past Medical History:  Diagnosis Date  . Femur fracture, left ([place]) 1990   with open reduction and internal fixation   . H/O: rheumatic fever   . Hepatitis B carrier ([place])    positive hepatitis B carrier; maintained on Baraclude; followed by Hepatitis clinic.  Marland Kitchen History of allergic rhinitis   . Severe aortic insufficiency 09/01/2004   s/p aortic valve replacement.      Past Surgical History:  Procedure Laterality Date  . AORTIC VALVE REPLACEMENT  4/06   50mm On-X mechanical valve  . CARDIAC CATHETERIZATION  11/15/2004   Ejection fraction is estimated 55%  . HEMORRHOID SURGERY      Social History   Tobacco Use  . Smoking status: Never Smoker  . Smokeless tobacco: Never Used  Substance Use Topics  . Alcohol use: No    Alcohol/week: 0.0 standard drinks  . Drug use: No    Family History  Problem Relation Age of Onset  . Heart disease Brother        valvular dysfunction; s/p replacement  . Hypertension Mother    . Hyperlipidemia Mother   . Hypothyroidism Mother   . Heart disease Father   . Hypertension Father   . Hyperlipidemia Father   . Stomach cancer Maternal Grandmother   . Colon cancer Neg Hx   . Rectal cancer Neg Hx   . Colon polyps Neg Hx     No Known Allergies  Medication list has been reviewed and updated.  Current Outpatient Medications on File Prior to Visit  Medication Sig Dispense Refill  . entecavir (BARACLUDE) 1 MG tablet Take 1 mg by mouth daily.    . FEROSUL 325 (65 Fe) MG tablet TAKE 1 TABLET(325 MG) BY MOUTH TWICE DAILY WITH A MEAL 60 tablet 5  . levothyroxine (SYNTHROID, LEVOTHROID) 75 MCG tablet TAKE 1 TABLET BY MOUTH ONCE DAILY BEFORE BREAKFAST 90 tablet 0  . warfarin (COUMADIN) 5 MG tablet Take 1.5 tablets by mouth daily or as directed by coumadin clinic. NEEDS FOLLOW UP WITH COUMADIN CLINIC PRIOR TO NEXT REFILL 30 tablet 0   No current facility-administered medications on file prior to visit.    Review of Systems:  As per HPI- otherwise negative.   Physical Examination: There were no vitals filed for this visit. There were no vitals filed for this visit. There is no height or weight on file to calculate BMI. Ideal Body Weight:       Assessment and Plan: ***  Signed Lamar Blinks, MD

## 2019-09-14 ENCOUNTER — Encounter (INDEPENDENT_AMBULATORY_CARE_PROVIDER_SITE_OTHER): Payer: Self-pay

## 2019-09-14 ENCOUNTER — Ambulatory Visit: Payer: BLUE CROSS/BLUE SHIELD | Admitting: Family Medicine

## 2019-09-14 ENCOUNTER — Other Ambulatory Visit: Payer: Self-pay

## 2019-09-14 ENCOUNTER — Ambulatory Visit (INDEPENDENT_AMBULATORY_CARE_PROVIDER_SITE_OTHER): Payer: 59 | Admitting: Pharmacist Clinician (PhC)/ Clinical Pharmacy Specialist

## 2019-09-14 DIAGNOSIS — Z952 Presence of prosthetic heart valve: Secondary | ICD-10-CM | POA: Diagnosis not present

## 2019-09-14 DIAGNOSIS — Z7901 Long term (current) use of anticoagulants: Secondary | ICD-10-CM | POA: Diagnosis not present

## 2019-09-14 DIAGNOSIS — I359 Nonrheumatic aortic valve disorder, unspecified: Secondary | ICD-10-CM

## 2019-09-14 LAB — POCT INR: INR: 2.1 (ref 2.0–3.0)

## 2019-09-20 NOTE — Progress Notes (Signed)
[place] at [facility] [address] [place], [place] [address] [phone]  Date:  09/21/2019   Name:  [name]   DOB:  April 16, 1971   MRN:  [id number]  PCP:  Darreld Mclean, MD    Chief Complaint: No chief complaint on file.   History of Present Illness:  [name] is a 49 y.o. very pleasant female patient who presents with the following:  Virtual visit today to reestablish care Patient location is home, provider location is office.  Patient identity from 2 factors, she gives consent for virtual visit today.  The pt and myself are on the call today  Patient with history of post radiation hypothyroidism, hepatitis B, aortic valve disorder due to rheumatic fever with valve replacement on Coumadin Last seen by myself in December 2019  Her INR is managed by Coumadin clinic Dr. Dwyane Dee is her endocrinologist- however she has not seen him in some time, she would like [place] to manage her thyroid now   She also has iron deficiency, has taken iron tablets off and on-she is now seeing hematology through [facility]- her hematologist is Dr Tamala Julian, they have been treating her with iron and her Hg came   Flu vaccine- done in September  Patient Active Problem List   Diagnosis Date Noted  . Hypothyroidism, postradioiodine therapy 02/04/2017  . Vasovagal syncope 05/02/2015  . Hepatitis B 04/28/2012  . Aortic valve disorder 02/11/2012  . Long term (current) use of anticoagulants 02/11/2012  . S/P AVR 12/16/2011  . Chronic anticoagulation 12/16/2011  . PULMONARY NODULE 01/31/2010  . DYSPNEA 01/31/2010    Past Medical History:  Diagnosis Date  . Femur fracture, left ([place]) 1990   with open reduction and internal fixation   . H/O: rheumatic fever   . Hepatitis B carrier ([place])    positive hepatitis B carrier; maintained on Baraclude; followed by Hepatitis clinic.  Marland Kitchen History of allergic rhinitis   . Severe aortic insufficiency 09/01/2004    s/p aortic valve replacement.      Past Surgical History:  Procedure Laterality Date  . AORTIC VALVE REPLACEMENT  4/06   19mm On-X mechanical valve  . CARDIAC CATHETERIZATION  11/15/2004   Ejection fraction is estimated 55%  . HEMORRHOID SURGERY      Social History   Tobacco Use  . Smoking status: Never Smoker  . Smokeless tobacco: Never Used  Substance Use Topics  . Alcohol use: No    Alcohol/week: 0.0 standard drinks  . Drug use: No    Family History  Problem Relation Age of Onset  . Heart disease Brother        valvular dysfunction; s/p replacement  . Hypertension Mother   . Hyperlipidemia Mother   . Hypothyroidism Mother   . Heart disease Father   . Hypertension Father   . Hyperlipidemia Father   . Stomach cancer Maternal Grandmother   . Colon cancer Neg Hx   . Rectal cancer Neg Hx   . Colon polyps Neg Hx     No Known Allergies  Medication list has been reviewed and updated.  Current Outpatient Medications on File Prior to Visit  Medication Sig Dispense Refill  . entecavir (BARACLUDE) 1 MG tablet Take 1 mg by mouth daily.    . FEROSUL 325 (65 Fe) MG tablet TAKE 1 TABLET(325 MG) BY MOUTH TWICE DAILY WITH A MEAL 60 tablet 5  . levothyroxine (SYNTHROID, LEVOTHROID) 75 MCG  tablet TAKE 1 TABLET BY MOUTH ONCE DAILY BEFORE BREAKFAST 90 tablet 0  . warfarin (COUMADIN) 5 MG tablet Take 1.5 tablets by mouth daily or as directed by coumadin clinic. NEEDS FOLLOW UP WITH COUMADIN CLINIC PRIOR TO NEXT REFILL 30 tablet 0   No current facility-administered medications on file prior to visit.    Review of Systems:  As per HPI- otherwise negative.   Physical Examination: There were no vitals filed for this visit. There were no vitals filed for this visit. There is no height or weight on file to calculate BMI. Ideal Body Weight:     pt observed on video monitor She looks well- no cough or wheezing is noted    Assessment and Plan: Iron deficiency anemia,  unspecified iron deficiency anemia type - Plan: CBC, Ferritin  Acquired hypothyroidism - Plan: TSH  Screening for diabetes mellitus - Plan: Comprehensive metabolic panel  Vitamin D deficiency - Plan: Vitamin D (25 hydroxy)  Virtual visit today for follow-up Pt needs some labs ordered- she is no longer seeing endocrinology and asks me to monitor her thyroid She is seeing hematology but needs a recheck of her CBC and iron She also needs her vit D checked   Signed Lamar Blinks, MD

## 2019-09-21 ENCOUNTER — Ambulatory Visit (INDEPENDENT_AMBULATORY_CARE_PROVIDER_SITE_OTHER): Payer: 59 | Admitting: Family Medicine

## 2019-09-21 ENCOUNTER — Other Ambulatory Visit: Payer: Self-pay

## 2019-09-21 ENCOUNTER — Encounter: Payer: Self-pay | Admitting: Family Medicine

## 2019-09-21 DIAGNOSIS — E039 Hypothyroidism, unspecified: Secondary | ICD-10-CM

## 2019-09-21 DIAGNOSIS — Z131 Encounter for screening for diabetes mellitus: Secondary | ICD-10-CM

## 2019-09-21 DIAGNOSIS — E559 Vitamin D deficiency, unspecified: Secondary | ICD-10-CM | POA: Diagnosis not present

## 2019-09-21 DIAGNOSIS — D509 Iron deficiency anemia, unspecified: Secondary | ICD-10-CM | POA: Diagnosis not present

## 2019-10-05 ENCOUNTER — Other Ambulatory Visit (INDEPENDENT_AMBULATORY_CARE_PROVIDER_SITE_OTHER): Payer: 59

## 2019-10-05 ENCOUNTER — Other Ambulatory Visit: Payer: Self-pay

## 2019-10-05 DIAGNOSIS — E559 Vitamin D deficiency, unspecified: Secondary | ICD-10-CM | POA: Diagnosis not present

## 2019-10-05 DIAGNOSIS — D509 Iron deficiency anemia, unspecified: Secondary | ICD-10-CM | POA: Diagnosis not present

## 2019-10-05 DIAGNOSIS — E039 Hypothyroidism, unspecified: Secondary | ICD-10-CM

## 2019-10-05 DIAGNOSIS — Z131 Encounter for screening for diabetes mellitus: Secondary | ICD-10-CM | POA: Diagnosis not present

## 2019-10-05 LAB — COMPREHENSIVE METABOLIC PANEL
ALT: 13 U/L (ref 0–35)
AST: 15 U/L (ref 0–37)
Albumin: 4 g/dL (ref 3.5–5.2)
Alkaline Phosphatase: 33 U/L — ABNORMAL LOW (ref 39–117)
BUN: 14 mg/dL (ref 6–23)
CO2: 30 mEq/L (ref 19–32)
Calcium: 8.8 mg/dL (ref 8.4–10.5)
Chloride: 106 mEq/L (ref 96–112)
Creatinine, Ser: 0.61 mg/dL (ref 0.40–1.20)
GFR: 105.17 mL/min (ref >60.00)
Glucose, Bld: 75 mg/dL (ref 70–99)
Potassium: 3.8 mEq/L (ref 3.5–5.1)
Sodium: 141 mEq/L (ref 135–145)
Total Bilirubin: 0.5 mg/dL (ref 0.2–1.2)
Total Protein: 6.9 g/dL (ref 6.0–8.3)

## 2019-10-05 LAB — CBC
HCT: 39.4 % (ref 36.0–46.0)
Hemoglobin: 13 g/dL (ref 12.0–15.0)
MCHC: 33 g/dL (ref 30.0–36.0)
MCV: 92.2 fl (ref 78.0–100.0)
Platelets: 266 10*3/uL (ref 150.0–400.0)
RBC: 4.28 Mil/uL (ref 3.87–5.11)
RDW: 15 % (ref 11.5–15.5)
WBC: 4.3 10*3/uL (ref 4.0–10.5)

## 2019-10-05 LAB — FERRITIN: Ferritin: 32.3 ng/mL (ref 10.0–291.0)

## 2019-10-05 LAB — TSH: TSH: 6.97 u[IU]/mL — ABNORMAL HIGH (ref 0.35–4.50)

## 2019-10-05 LAB — VITAMIN D 25 HYDROXY (VIT D DEFICIENCY, FRACTURES): VITD: 24.14 ng/mL — ABNORMAL LOW (ref 30.00–100.00)

## 2019-10-06 ENCOUNTER — Telehealth: Payer: Self-pay | Admitting: Family Medicine

## 2019-10-06 DIAGNOSIS — E039 Hypothyroidism, unspecified: Secondary | ICD-10-CM

## 2019-10-06 MED ORDER — LEVOTHYROXINE SODIUM 100 MCG PO TABS: ORAL_TABLET | ORAL | 3 refills | Status: DC

## 2019-10-06 NOTE — Telephone Encounter (Signed)
Received her labs as below, gave patient a call as we need to adjust her thyroid she is currently taking 75 mcg on a regular basis, she states good compliance.  We will increase her thyroid to 100 mcg, plan repeat TSH in 1 to 2 months She is still taking iron BID-counseled her to decrease to three times a week will send copy of labs in the mail  Results for orders placed or performed in visit on 10/05/19  Ferritin  Result Value Ref Range   Ferritin 32.3 10.0 - 291.0 ng/mL  CBC  Result Value Ref Range   WBC 4.3 4.0 - 10.5 K/uL   RBC 4.28 3.87 - 5.11 Mil/uL   Platelets 266.0 150.0 - 400.0 K/uL   Hemoglobin 13.0 12.0 - 15.0 g/dL   HCT 39.4 36.0 - 46.0 %   MCV 92.2 78.0 - 100.0 fl   MCHC 33.0 30.0 - 36.0 g/dL   RDW 15.0 11.5 - 15.5 %  Vitamin D (25 hydroxy)  Result Value Ref Range   VITD 24.14 (L) 30.00 - 100.00 ng/mL  TSH  Result Value Ref Range   TSH 6.97 (H) 0.35 - 4.50 uIU/mL  Comprehensive metabolic panel  Result Value Ref Range   Sodium 141 135 - 145 mEq/L   Potassium 3.8 3.5 - 5.1 mEq/L   Chloride 106 96 - 112 mEq/L   CO2 30 19 - 32 mEq/L   Glucose, Bld 75 70 - 99 mg/dL   BUN 14 6 - 23 mg/dL   Creatinine, Ser 0.61 0.40 - 1.20 mg/dL   Total Bilirubin 0.5 0.2 - 1.2 mg/dL   Alkaline Phosphatase 33 (L) 39 - 117 U/L   AST 15 0 - 37 U/L   ALT 13 0 - 35 U/L   Total Protein 6.9 6.0 - 8.3 g/dL   Albumin 4.0 3.5 - 5.2 g/dL   GFR 105.17 >60.00 mL/min   Calcium 8.8 8.4 - 10.5 mg/dL

## 2019-10-12 ENCOUNTER — Ambulatory Visit: Payer: 59 | Attending: Internal Medicine

## 2019-10-12 DIAGNOSIS — Z20822 Contact with and (suspected) exposure to covid-19: Secondary | ICD-10-CM

## 2019-10-13 LAB — NOVEL CORONAVIRUS, NAA: SARS-CoV-2, NAA: NOT DETECTED

## 2019-10-26 ENCOUNTER — Ambulatory Visit (INDEPENDENT_AMBULATORY_CARE_PROVIDER_SITE_OTHER): Payer: 59 | Admitting: Pharmacist Clinician (PhC)/ Clinical Pharmacy Specialist

## 2019-10-26 ENCOUNTER — Other Ambulatory Visit: Payer: Self-pay

## 2019-10-26 DIAGNOSIS — Z952 Presence of prosthetic heart valve: Secondary | ICD-10-CM

## 2019-10-26 DIAGNOSIS — I359 Nonrheumatic aortic valve disorder, unspecified: Secondary | ICD-10-CM | POA: Diagnosis not present

## 2019-10-26 DIAGNOSIS — Z7901 Long term (current) use of anticoagulants: Secondary | ICD-10-CM | POA: Diagnosis not present

## 2019-10-26 LAB — POCT INR: INR: 2.3 (ref 2.0–3.0)

## 2019-10-28 ENCOUNTER — Other Ambulatory Visit: Payer: Self-pay | Admitting: Nurse Practitioner

## 2019-10-28 DIAGNOSIS — B181 Chronic viral hepatitis B without delta-agent: Secondary | ICD-10-CM

## 2019-11-02 ENCOUNTER — Ambulatory Visit: Payer: Self-pay

## 2019-11-02 ENCOUNTER — Ambulatory Visit (INDEPENDENT_AMBULATORY_CARE_PROVIDER_SITE_OTHER): Payer: 59 | Admitting: Physician Assistant

## 2019-11-02 ENCOUNTER — Ambulatory Visit
Admission: RE | Admit: 2019-11-02 | Discharge: 2019-11-02 | Disposition: A | Discharge status: Home / Self Care | Payer: 59 | Source: Ambulatory Visit | Attending: Nurse Practitioner | Admitting: Nurse Practitioner

## 2019-11-02 ENCOUNTER — Other Ambulatory Visit: Payer: Self-pay

## 2019-11-02 ENCOUNTER — Encounter: Payer: Self-pay | Admitting: Physician Assistant

## 2019-11-02 VITALS — Ht 64.0 in | Wt 137.0 lb

## 2019-11-02 DIAGNOSIS — M25561 Pain in right knee: Secondary | ICD-10-CM

## 2019-11-02 DIAGNOSIS — B181 Chronic viral hepatitis B without delta-agent: Secondary | ICD-10-CM

## 2019-11-02 DIAGNOSIS — G8929 Other chronic pain: Secondary | ICD-10-CM

## 2019-11-02 MED ORDER — METHYLPREDNISOLONE ACETATE 40 MG/ML IJ SUSP
40.0000 mg | INTRAMUSCULAR | Status: AC | PRN
  Administered 2019-11-02: 40 mg via INTRA_ARTICULAR

## 2019-11-02 MED ORDER — LIDOCAINE HCL 1 % IJ SOLN
1.0000 mL | INTRAMUSCULAR | Status: AC | PRN
  Administered 2019-11-02: 1 mL

## 2019-11-02 NOTE — Progress Notes (Signed)
Office Visit Note   Patient: [name]           Date of Birth: [birth year]           MRN: [id number] Visit Date: 11/02/2019              Requested by: Darreld Mclean, MD [address] [place],  [place] [address] PCP: Darreld Mclean, MD  Chief Complaint  Patient presents with  . Right Knee - Pain      HPI: This is a pleasant 49 year old woman who has a chief complaint of right knee pain both on the front of her knee and on the inside of her knee she denies any specific injury.  She is status post intramedullary rodding on the left femur.  She denies any instability  Assessment & Plan: Visit Diagnoses:  1. Chronic pain of right knee     Plan: I have given the patient some information on closed chain exercises for quadricep strengthening.  I also discussed a knee injection with her today and she would like to go forward with this findings consistent with some chondromalacia patella as well as medial knee pain.  She will follow-up in 3 weeks if she is still having difficulties  Follow-Up Instructions: No follow-ups on file.   Ortho Exam  Patient is alert, oriented, no adenopathy, well-dressed, normal affect, normal respiratory effort. Focused examination of her right knee demonstrates some soft tissue swelling.  There is no effusion.  Distal CMS intact no cellulitis no obvious trauma she is tender over the medial joint line.  Also has crepitus with flexion and extension of her knee over the patellofemoral joint.  Patella tendon and quadricep tendon are intact  Imaging: US Abdomen Limited RUQ  Result Date: 11/02/2019 CLINICAL DATA:  Hepatitis-B. EXAM: ULTRASOUND ABDOMEN LIMITED RIGHT UPPER QUADRANT COMPARISON:  05/26/2018. FINDINGS: Gallbladder: No gallstones or wall thickening visualized. No sonographic Murphy sign noted by sonographer. Common bile duct: Diameter: 4 mm, within normal limits. Liver: No focal lesion identified. Within normal limits in  parenchymal echogenicity. Portal vein is patent on color Doppler imaging with normal direction of blood flow towards the liver. Other: None. IMPRESSION: Negative. Electronically Signed   By: Lorin Picket M.D.   On: 11/02/2019 14:45   No images are attached to the encounter.  Labs: Lab Results  Component Value Date   HGBA1C 5.3 08/11/2018   HGBA1C 5.4 08/05/2017   HGBA1C 5.6 06/13/2015   LABORGA NO GROWTH 08/02/2012     Lab Results  Component Value Date   ALBUMIN 4.0 10/05/2019   ALBUMIN 4.3 08/11/2018   ALBUMIN 4.3 08/05/2017    No results found for: MG Lab Results  Component Value Date   VD25OH 24.14 (L) 10/05/2019   VD25OH 41 06/25/2016   VD25OH 28 (L) 07/06/2013    No results found for: PREALBUMIN CBC EXTENDED Latest Ref Rng & Units 10/05/2019 08/11/2018 04/28/2018  WBC 4.0 - 10.5 K/uL 4.3 5.1 4.5  RBC 3.87 - 5.11 Mil/uL 4.28 4.40 4.69  HGB 12.0 - 15.0 g/dL 13.0 13.2 13.2  HCT 36.0 - 46.0 % 39.4 39.6 40.4  PLT 150.0 - 400.0 K/uL 266.0 272.0 227.0  NEUTROABS 1,500 - 7,800 cells/uL - - -  LYMPHSABS 850 - 3,900 cells/uL - - -     Body mass index is 23.52 kg/m.  Orders:  Orders Placed This Encounter  Procedures  . XR Knee 1-2 Views Right   No orders of  the defined types were placed in this encounter.    Procedures: Large Joint Inj: R knee on 11/02/2019 3:18 PM Indications: pain and diagnostic evaluation Details: 22 G 1.5 in needle, anterolateral approach  Arthrogram: No  Medications: 40 mg methylPREDNISolone acetate 40 MG/ML; 1 mL lidocaine 1 % Outcome: tolerated well, no immediate complications Procedure, treatment alternatives, risks and benefits explained, specific risks discussed. Consent was given by the patient. Immediately prior to procedure a time out was called to verify the correct patient, procedure, equipment, support staff and site/side marked as required. Patient was prepped and draped in the usual sterile fashion.      Clinical Data: No  additional findings.  ROS:  All other systems negative, except as noted in the HPI. Review of Systems  Objective: Vital Signs: Ht 5\' 4"  (1.626 m)   Wt 137 lb (62.1 kg)   BMI 23.52 kg/m   Specialty Comments:  No specialty comments available.  PMFS History: Patient Active Problem List   Diagnosis Date Noted  . Hypothyroidism, postradioiodine therapy 02/04/2017  . Vasovagal syncope 05/02/2015  . Hepatitis B 04/28/2012  . Aortic valve disorder 02/11/2012  . Long term (current) use of anticoagulants 02/11/2012  . S/P AVR 12/16/2011  . Chronic anticoagulation 12/16/2011  . PULMONARY NODULE 01/31/2010  . DYSPNEA 01/31/2010   Past Medical History:  Diagnosis Date  . Femur fracture, left ([place]) 1990   with open reduction and internal fixation   . H/O: rheumatic fever   . Hepatitis B carrier ([place])    positive hepatitis B carrier; maintained on Baraclude; followed by Hepatitis clinic.  Marland Kitchen History of allergic rhinitis   . Severe aortic insufficiency 09/01/2004   s/p aortic valve replacement.      Family History  Problem Relation Age of Onset  . Heart disease Brother        valvular dysfunction; s/p replacement  . Hypertension Mother   . Hyperlipidemia Mother   . Hypothyroidism Mother   . Heart disease Father   . Hypertension Father   . Hyperlipidemia Father   . Stomach cancer Maternal Grandmother   . Colon cancer Neg Hx   . Rectal cancer Neg Hx   . Colon polyps Neg Hx     Past Surgical History:  Procedure Laterality Date  . AORTIC VALVE REPLACEMENT  4/06   2mm On-X mechanical valve  . CARDIAC CATHETERIZATION  11/15/2004   Ejection fraction is estimated 55%  . HEMORRHOID SURGERY     Social History   Occupational History  . Occupation: Tour manager: [employer]  Tobacco Use  . Smoking status: Never Smoker  . Smokeless tobacco: Never Used  Substance and Sexual Activity  . Alcohol use: No    Alcohol/week: 0.0 standard drinks  . Drug use: No  .  Sexual activity: Yes    Partners: Male

## 2019-11-06 ENCOUNTER — Ambulatory Visit: Payer: 59 | Attending: Internal Medicine

## 2019-11-06 DIAGNOSIS — Z23 Encounter for immunization: Secondary | ICD-10-CM | POA: Insufficient documentation

## 2019-11-06 NOTE — Progress Notes (Signed)
   Covid-19 Vaccination Clinic  Name:  CHAUNTELL CREDIT    MRN: [id number] DOB: [birth year]  11/06/2019  Ms. Denunzio was observed post Covid-19 immunization for 15 minutes without incident. She was provided with Vaccine Information Sheet and instruction to access the V-Safe system.   Ms. Huntsberger was instructed to call 911 with any severe reactions post vaccine: Marland Kitchen Difficulty breathing  . Swelling of face and throat  . A fast heartbeat  . A bad rash all over body  . Dizziness and weakness   Immunizations Administered    Name Date Dose VIS Date Route   Pfizer COVID-19 Vaccine 11/06/2019  3:55 PM 0.3 mL 08/12/2019 Intramuscular   Manufacturer: [place]   Lot: [id number]   [place]: [id number]

## 2019-11-23 ENCOUNTER — Ambulatory Visit: Payer: 59 | Admitting: Physician Assistant

## 2019-12-07 ENCOUNTER — Ambulatory Visit: Payer: 59 | Attending: Internal Medicine

## 2019-12-07 DIAGNOSIS — Z23 Encounter for immunization: Secondary | ICD-10-CM

## 2019-12-07 NOTE — Progress Notes (Signed)
   Covid-19 Vaccination Clinic  Name:  [name]    MRN: [id number] DOB: 04/24/1971  12/07/2019  [name] was observed post Covid-19 immunization for 15 minutes without incident. She was provided with Vaccine Information Sheet and instruction to access the V-Safe system.   [name] was instructed to call 911 with any severe reactions post vaccine: Marland Kitchen Difficulty breathing  . Swelling of face and throat  . A fast heartbeat  . A bad rash all over body  . Dizziness and weakness   Immunizations Administered    Name Date Dose VIS Date Route   Pfizer COVID-19 Vaccine 12/07/2019 11:10 AM 0.3 mL 08/12/2019 Intramuscular   Manufacturer: [employer]   Lot: [id number]   [place]: [id number]

## 2019-12-14 ENCOUNTER — Other Ambulatory Visit: Payer: Self-pay

## 2019-12-14 ENCOUNTER — Ambulatory Visit (INDEPENDENT_AMBULATORY_CARE_PROVIDER_SITE_OTHER): Payer: 59 | Admitting: Pharmacist Clinician (PhC)/ Clinical Pharmacy Specialist

## 2019-12-14 DIAGNOSIS — I359 Nonrheumatic aortic valve disorder, unspecified: Secondary | ICD-10-CM

## 2019-12-14 DIAGNOSIS — Z7901 Long term (current) use of anticoagulants: Secondary | ICD-10-CM | POA: Diagnosis not present

## 2019-12-14 DIAGNOSIS — Z952 Presence of prosthetic heart valve: Secondary | ICD-10-CM

## 2019-12-14 LAB — POCT INR: INR: 3.5 — AB (ref 2.0–3.0)

## 2019-12-14 MED ORDER — WARFARIN SODIUM 5 MG PO TABS: ORAL_TABLET | ORAL | 0 refills | Status: DC

## 2020-01-04 ENCOUNTER — Ambulatory Visit (INDEPENDENT_AMBULATORY_CARE_PROVIDER_SITE_OTHER): Payer: 59 | Admitting: *Deleted

## 2020-01-04 ENCOUNTER — Other Ambulatory Visit: Payer: Self-pay

## 2020-01-04 DIAGNOSIS — I359 Nonrheumatic aortic valve disorder, unspecified: Secondary | ICD-10-CM | POA: Diagnosis not present

## 2020-01-04 DIAGNOSIS — Z7901 Long term (current) use of anticoagulants: Secondary | ICD-10-CM | POA: Diagnosis not present

## 2020-01-04 DIAGNOSIS — Z5181 Encounter for therapeutic drug level monitoring: Secondary | ICD-10-CM | POA: Diagnosis not present

## 2020-01-04 LAB — POCT INR: INR: 2.6 (ref 2.0–3.0)

## 2020-01-04 NOTE — Patient Instructions (Signed)
Description   Continue taking 1 tablet daily except 1.5 tablets on  Friday.  Repeat INR in 4 weeks

## 2020-01-11 ENCOUNTER — Telehealth (INDEPENDENT_AMBULATORY_CARE_PROVIDER_SITE_OTHER): Payer: 59 | Admitting: Family Medicine

## 2020-01-11 ENCOUNTER — Encounter: Payer: Self-pay | Admitting: Family Medicine

## 2020-01-11 ENCOUNTER — Other Ambulatory Visit: Payer: Self-pay

## 2020-01-11 DIAGNOSIS — J0110 Acute frontal sinusitis, unspecified: Secondary | ICD-10-CM

## 2020-01-11 MED ORDER — AMOXICILLIN 500 MG PO CAPS: 1000.0000 mg | ORAL_CAPSULE | Freq: Two times a day (BID) | ORAL | 0 refills | Status: DC

## 2020-01-11 MED ORDER — BENZONATATE 100 MG PO CAPS: 100.0000 mg | ORAL_CAPSULE | Freq: Three times a day (TID) | ORAL | 0 refills | Status: DC | PRN

## 2020-01-11 MED ORDER — HYDROCODONE-HOMATROPINE 5-1.5 MG/5ML PO SYRP: 5.0000 mL | ORAL_SOLUTION | Freq: Three times a day (TID) | ORAL | 0 refills | Status: DC | PRN

## 2020-01-11 NOTE — Progress Notes (Signed)
[facility] [address], [place], [place] [address] [phone]  Date:  01/11/2020   Name:  [name]   DOB:  03-Dec-1970   MRN:  [id number]  PCP:  Darreld Mclean, MD    Chief Complaint: No chief complaint on file.   History of Present Illness:  [name] is a 49 y.o. very pleasant female patient who presents with the following:  Patient with history of post radioiodine hypothyroidism, chronic anticoagulation with Coumadin due to aortic valve replacement (managed by Coumadin clinic) Last seen by myself in January for virtual visit to re-establish care She does have history of iron deficiency anemia, is seen by hematology at [facility]  Virtual visit today for concern of possible sinus infection- 6-7 days.  Started with sinus pressure No fever noted  She has noted some cough, wheezing when she lays down No vomiting, some diarrhea She had a ST and used a salt gargle- this helped  No runny nose except if she bends her head down No sneezing   Patient location is home, provider location is office.  Patient identity confirmed with 2 factors, she gives consent for virtual visit today.  The pt and myself are on the call today   She has tried some mucinex, tylenol cold and flu, tylenol sinus and headaches She is fully vaccinated against covid 19 LMP is current   Lab Results  Component Value Date   INR 2.6 01/04/2020   INR 3.5 (A) 12/14/2019   INR 2.3 10/26/2019     Patient Active Problem List   Diagnosis Date Noted  . Hypothyroidism, postradioiodine therapy 02/04/2017  . Vasovagal syncope 05/02/2015  . Hepatitis B 04/28/2012  . Aortic valve disorder 02/11/2012  . Long term (current) use of anticoagulants 02/11/2012  . S/P AVR 12/16/2011  . Chronic anticoagulation 12/16/2011  . PULMONARY NODULE 01/31/2010  . DYSPNEA 01/31/2010    Past Medical History:  Diagnosis Date  . Femur fracture, left ([place])  1990   with open reduction and internal fixation   . H/O: rheumatic fever   . Hepatitis B carrier ([place])    positive hepatitis B carrier; maintained on Baraclude; followed by Hepatitis clinic.  Marland Kitchen History of allergic rhinitis   . Severe aortic insufficiency 09/01/2004   s/p aortic valve replacement.      Past Surgical History:  Procedure Laterality Date  . AORTIC VALVE REPLACEMENT  4/06   12mm On-X mechanical valve  . CARDIAC CATHETERIZATION  11/15/2004   Ejection fraction is estimated 55%  . HEMORRHOID SURGERY      Social History   Tobacco Use  . Smoking status: Never Smoker  . Smokeless tobacco: Never Used  Substance Use Topics  . Alcohol use: No    Alcohol/week: 0.0 standard drinks  . Drug use: No    Family History  Problem Relation Age of Onset  . Heart disease Brother        valvular dysfunction; s/p replacement  . Hypertension Mother   . Hyperlipidemia Mother   . Hypothyroidism Mother   . Heart disease Father   . Hypertension Father   . Hyperlipidemia Father   . Stomach cancer Maternal Grandmother   . Colon cancer Neg Hx   . Rectal cancer Neg Hx   . Colon polyps Neg Hx     No Known Allergies  Medication list has been reviewed and updated.  Current Outpatient Medications on File Prior  to Visit  Medication Sig Dispense Refill  . entecavir (BARACLUDE) 1 MG tablet Take 1 mg by mouth daily.    . FEROSUL 325 (65 Fe) MG tablet TAKE 1 TABLET(325 MG) BY MOUTH TWICE DAILY WITH A MEAL 60 tablet 5  . levothyroxine (SYNTHROID) 100 MCG tablet Take one daily for thyroid 90 tablet 3  . warfarin (COUMADIN) 5 MG tablet Take 1 to 1.5 tablets by mouth daily as directed 120 tablet 0   No current facility-administered medications on file prior to visit.    Review of Systems:  As per HPI- otherwise negative.   Physical Examination: There were no vitals filed for this visit. There were no vitals filed for this visit. There is no height or weight on file to calculate  BMI. Ideal Body Weight:   Not checking vitals at home  Pt observed via video monitor- she looks well, no distress or cough, wheezing noted   Assessment and Plan: Acute non-recurrent frontal sinusitis - Plan: amoxicillin (AMOXIL) 500 MG capsule, HYDROcodone-homatropine (HYCODAN) 5-1.5 MG/5ML syrup, DISCONTINUED: HYDROcodone-homatropine (HYCODAN) 5-1.5 MG/5ML syrup  Virtual visit today for likely sinus infection Treat with amoxicillin Pt requests cough syrup for day and for night- rx for hycodan and tessalon Cautioned regarding sedation with hycodan Encouraged her to be tested for covid 19 as her sx could represent this viral infection She will let me know if not feeling better in the next few days- Sooner if worse.  Moderate med decision making today    Signed Lamar Blinks, MD

## 2020-01-28 NOTE — Progress Notes (Signed)
[name] Date of Birth: 05-12-1971 Medical Record [id number]  History of Present Illness: [name] is seen today for followup. She has a history of severe aortic insufficiency and underwent aortic valve replacement with a mechanical prosthesis in April of 2006. This was a #21 mm On-X valve. She does have some chronic mild dyspnea. She's had extensive pulmonary and cardiac evaluation for this which has been unremarkable. She is on chronic anticoagulation. She does have a history of hepatitis B and hyperthyroidism s/p radioactive iodine ablation.  On follow up today she is doing well.  She states she is walking daily. She is working. She denies any chest pain or palpitations. Energy level is good. No significant dyspnea now.  Current Outpatient Medications on File Prior to Visit  Medication Sig Dispense Refill   FEROSUL 325 (65 Fe) MG tablet TAKE 1 TABLET(325 MG) BY MOUTH TWICE DAILY WITH A MEAL 60 tablet 5   levothyroxine (SYNTHROID) 100 MCG tablet Take one daily for thyroid 90 tablet 3   VEMLIDY 25 MG TABS Take 1 tablet by mouth daily.     warfarin (COUMADIN) 5 MG tablet Take 1 to 1.5 tablets by mouth daily as directed 120 tablet 0   No current facility-administered medications on file prior to visit.    No Known Allergies  Past Medical History:  Diagnosis Date   Femur fracture, left ([place]) 1990   with open reduction and internal fixation    H/O: rheumatic fever    Hepatitis B carrier ([place])    positive hepatitis B carrier; maintained on Baraclude; followed by Hepatitis clinic.   History of allergic rhinitis    Severe aortic insufficiency 09/01/2004   s/p aortic valve replacement.      Past Surgical History:  Procedure Laterality Date   AORTIC VALVE REPLACEMENT  4/06   60mm On-X mechanical valve   CARDIAC CATHETERIZATION  11/15/2004   Ejection fraction is estimated 55%   HEMORRHOID SURGERY      Social History   Tobacco Use  Smoking Status Never Smoker  Smokeless  Tobacco Never Used    Social History   Substance and Sexual Activity  Alcohol Use No   Alcohol/week: 0.0 standard drinks    Family History  Problem Relation Age of Onset   Heart disease Brother        valvular dysfunction; s/p replacement   Hypertension Mother    Hyperlipidemia Mother    Hypothyroidism Mother    Heart disease Father    Hypertension Father    Hyperlipidemia Father    Stomach cancer Maternal Grandmother    Colon cancer Neg Hx    Rectal cancer Neg Hx    Colon polyps Neg Hx     Review of Systems: As noted in history of present illness.  All other systems were reviewed and are negative.  Physical Exam: BP 114/72    Pulse 69    Ht 5\' 4"  (1.626 m)    Wt 133 lb 12.8 oz (60.7 kg)    BMI 22.97 kg/m  GENERAL:  Well appearing Asian female in NAD HEENT:  PERRL, EOMI, sclera are clear. Oropharynx is clear. NECK:  No jugular venous distention, carotid upstroke brisk and symmetric, no bruits, no thyromegaly or adenopathy LUNGS:  Clear to auscultation bilaterally CHEST:  Unremarkable HEART:  RRR,  PMI not displaced or sustained, normal mechanical AV click, no S3, no S4: no clicks, no rubs, no murmurs ABD:  Soft, nontender. BS +, no masses or bruits. No hepatomegaly,  no splenomegaly EXT:  2 + pulses throughout, no edema, no cyanosis no clubbing SKIN:  Warm and dry.  No rashes NEURO:  Alert and oriented x 3. Cranial nerves II through XII intact. PSYCH:  Cognitively intact    LABORATORY DATA:  Lab Results  Component Value Date   WBC 4.3 10/05/2019   HGB 13.0 10/05/2019   HCT 39.4 10/05/2019   PLT 266.0 10/05/2019   GLUCOSE 75 10/05/2019   CHOL 171 08/11/2018   TRIG 122.0 08/11/2018   HDL 54.50 08/11/2018   LDLCALC 92 08/11/2018   ALT 13 10/05/2019   AST 15 10/05/2019   NA 141 10/05/2019   K 3.8 10/05/2019   CL 106 10/05/2019   CREATININE 0.61 10/05/2019   BUN 14 10/05/2019   CO2 30 10/05/2019   TSH 6.97 (H) 10/05/2019   INR 2.6 01/04/2020    HGBA1C 5.3 08/11/2018   Ecg today shows NSR with PVCs. Nonspecific TWA. I have personally reviewed and interpreted this study.  Echo 12/12/14:Study Conclusions  - Left ventricle: The cavity size was normal. Wall thickness was normal. Systolic function was normal. The estimated ejection fraction was in the range of 55% to 60%. Left ventricular diastolic function parameters were normal. - Aortic valve: There was a mechanical aortic valve. No significant stenosis. There was trivial regurgitation. Mean gradient (S): 11 mm Hg. - Mitral valve: There was mild regurgitation. - Right ventricle: The cavity size was normal. Systolic function was normal. - Tricuspid valve: Peak RV-RA gradient (S): 22 mm Hg. - Pulmonary arteries: PA peak pressure: 25 mm Hg (S). - Inferior vena cava: The vessel was normal in size. The respirophasic diameter changes were in the normal range (= 50%), consistent with normal central venous pressure.  Impressions:  - Normal LV size with EF 55-60%. Normal diastolic function.c Normally functioning mechanical aortic valve. Mild MR. Normal RV size and systolic function.   Assessment / Plan: 1. Status post mechanical aortic valve replacement for severe aortic insufficiency. Good click on exam. Echo in April 2016 shoed good valve function. Continue anticoagulation and routine SBE prophylaxis.    2.  Hepatitis B.  3. Graves disease s/p radioactive thyroid ablation.   Follow up in one year.

## 2020-02-03 ENCOUNTER — Other Ambulatory Visit: Payer: Self-pay

## 2020-02-03 ENCOUNTER — Ambulatory Visit (INDEPENDENT_AMBULATORY_CARE_PROVIDER_SITE_OTHER): Payer: 59 | Admitting: Pharmacist

## 2020-02-03 ENCOUNTER — Encounter: Payer: Self-pay | Admitting: Cardiology

## 2020-02-03 ENCOUNTER — Ambulatory Visit (INDEPENDENT_AMBULATORY_CARE_PROVIDER_SITE_OTHER): Payer: 59 | Admitting: Cardiology

## 2020-02-03 VITALS — BP 114/72 | HR 69 | Ht 64.0 in | Wt 133.8 lb

## 2020-02-03 DIAGNOSIS — I359 Nonrheumatic aortic valve disorder, unspecified: Secondary | ICD-10-CM

## 2020-02-03 DIAGNOSIS — Z952 Presence of prosthetic heart valve: Secondary | ICD-10-CM

## 2020-02-03 DIAGNOSIS — Z7901 Long term (current) use of anticoagulants: Secondary | ICD-10-CM

## 2020-02-03 LAB — POCT INR: INR: 3.1 — AB (ref 2.0–3.0)

## 2020-03-02 ENCOUNTER — Other Ambulatory Visit: Payer: Self-pay | Admitting: Pharmacist Clinician (PhC)/ Clinical Pharmacy Specialist

## 2020-03-02 MED ORDER — WARFARIN SODIUM 5 MG PO TABS: ORAL_TABLET | ORAL | 0 refills | Status: DC

## 2020-03-09 ENCOUNTER — Ambulatory Visit (INDEPENDENT_AMBULATORY_CARE_PROVIDER_SITE_OTHER): Payer: 59 | Admitting: Pharmacist

## 2020-03-09 ENCOUNTER — Other Ambulatory Visit: Payer: Self-pay

## 2020-03-09 DIAGNOSIS — Z952 Presence of prosthetic heart valve: Secondary | ICD-10-CM | POA: Diagnosis not present

## 2020-03-09 DIAGNOSIS — Z7901 Long term (current) use of anticoagulants: Secondary | ICD-10-CM | POA: Diagnosis not present

## 2020-03-09 DIAGNOSIS — I359 Nonrheumatic aortic valve disorder, unspecified: Secondary | ICD-10-CM | POA: Diagnosis not present

## 2020-03-09 LAB — POCT INR: INR: 3.4 — AB (ref 2.0–3.0)

## 2020-03-09 NOTE — Patient Instructions (Addendum)
Description   Skip dose today and then continue taking 1 tablet daily except 1.5 tablets on  Friday.  Repeat INR in 3 weeks

## 2020-03-30 ENCOUNTER — Ambulatory Visit (INDEPENDENT_AMBULATORY_CARE_PROVIDER_SITE_OTHER): Payer: 59 | Admitting: Pharmacist

## 2020-03-30 ENCOUNTER — Other Ambulatory Visit: Payer: Self-pay

## 2020-03-30 DIAGNOSIS — Z952 Presence of prosthetic heart valve: Secondary | ICD-10-CM

## 2020-03-30 DIAGNOSIS — Z7901 Long term (current) use of anticoagulants: Secondary | ICD-10-CM | POA: Diagnosis not present

## 2020-03-30 DIAGNOSIS — I359 Nonrheumatic aortic valve disorder, unspecified: Secondary | ICD-10-CM

## 2020-03-30 LAB — POCT INR: INR: 4.5 — AB (ref 2.0–3.0)

## 2020-03-30 NOTE — Patient Instructions (Signed)
Skip dose today , then continue taking 1 tablet daily except 1.5 tablets on  Friday.  Repeat INR in 3 weeks *Okay to eat green vegetables twice per week*

## 2020-04-02 ENCOUNTER — Other Ambulatory Visit: Payer: Self-pay | Admitting: Family Medicine

## 2020-04-02 DIAGNOSIS — D509 Iron deficiency anemia, unspecified: Secondary | ICD-10-CM

## 2020-04-18 ENCOUNTER — Other Ambulatory Visit: Payer: Self-pay

## 2020-04-18 ENCOUNTER — Ambulatory Visit (INDEPENDENT_AMBULATORY_CARE_PROVIDER_SITE_OTHER): Payer: 59

## 2020-04-18 DIAGNOSIS — Z7901 Long term (current) use of anticoagulants: Secondary | ICD-10-CM | POA: Diagnosis not present

## 2020-04-18 DIAGNOSIS — Z5181 Encounter for therapeutic drug level monitoring: Secondary | ICD-10-CM

## 2020-04-18 DIAGNOSIS — Z952 Presence of prosthetic heart valve: Secondary | ICD-10-CM

## 2020-04-18 DIAGNOSIS — I359 Nonrheumatic aortic valve disorder, unspecified: Secondary | ICD-10-CM

## 2020-04-18 LAB — POCT INR: INR: 4.4 — AB (ref 2.0–3.0)

## 2020-04-18 NOTE — Patient Instructions (Signed)
Hold today and tomorrow and then  Decrease to 1 tablet daily.  Repeat INR in 2 weeks *Okay to eat green vegetables twice per week*   *Patient will let us know if interested in taking part of Eliquis/On-X clinical trial*

## 2020-04-23 NOTE — Progress Notes (Addendum)
[facility] [address], [place], [place] [address] [phone]  Date:  04/25/2020   Name:  [name]   DOB:  12-06-1970   MRN:  [id number]  PCP:  Darreld Mclean, MD    Chief Complaint: Annual Exam (lab work)   History of Present Illness:  [name] is a 49 y.o. very pleasant female patient who presents with the following:  Patient today for physical exam- history of post radiation hypothyroidism, hepatitis B, aortic valve disorder due to rheumatic fever with valve replacement on Coumadin Last seen by myself in January of this year Her anticoagulation is managed by Coumadin clinic Hematologist for iron deficiency anemia is Dr. Tamala Julian with [facility]  She is no longer seeing endocrinology, has asked [place] to manage her thyroid which is fine Most recent visit with her cardiologist, Dr. Martinique was in June:  1. Status post mechanical aortic valve replacement for severe aortic insufficiency. Good click on exam. Echo in April 2016 shoed good valve function. Continue anticoagulation and routine SBE prophylaxis.  Pap is up-to-date per gynecology [facility]  Mammogram- last month per her GYN  COVID-19 series complete Her hep B is managed [er [facility]  Most recent CMP, CBC, vitamin D, TSH in February TSH abnormal at that time, vitamin D also low Needs lipid panel  Iron supplementation Levothyroxine 100 Coumadin Hepatitis B antiviral  Pt notes that she went to UC 10 days ago- she was wheezing and coughing, covid negative  She was treated with an inhaler, prednisone, antibiotic  She had a similar illness about 2 years ago-otherwise she does not generally have wheezing, I advised her that unless this becomes a more chronic issue I would not put her on an asthma maintenance medication  She notes that she is much better although not 100% yet Still has some mucus in her chest and cough She would like a refill of cough  medication I used for her in the past -Hycodan She would like some acne cream for pimples that tend to form around her mouth  Patient Active Problem List   Diagnosis Date Noted  . Hypothyroidism, postradioiodine therapy 02/04/2017  . Vasovagal syncope 05/02/2015  . Hepatitis B 04/28/2012  . Aortic valve disorder 02/11/2012  . Long term (current) use of anticoagulants 02/11/2012  . S/P AVR 12/16/2011  . Chronic anticoagulation 12/16/2011  . PULMONARY NODULE 01/31/2010  . DYSPNEA 01/31/2010    Past Medical History:  Diagnosis Date  . Femur fracture, left ([place]) 1990   with open reduction and internal fixation   . H/O: rheumatic fever   . Hepatitis B carrier ([place])    positive hepatitis B carrier; maintained on Baraclude; followed by Hepatitis clinic.  Marland Kitchen History of allergic rhinitis   . Severe aortic insufficiency 09/01/2004   s/p aortic valve replacement.      Past Surgical History:  Procedure Laterality Date  . AORTIC VALVE REPLACEMENT  4/06   53mm On-X mechanical valve  . CARDIAC CATHETERIZATION  11/15/2004   Ejection fraction is estimated 55%  . HEMORRHOID SURGERY      Social History   Tobacco Use  . Smoking status: Never Smoker  . Smokeless tobacco: Never Used  Vaping Use  . Vaping Use: Never used  Substance Use Topics  . Alcohol use: No    Alcohol/week: 0.0 standard drinks  . Drug use: No    Family History  Problem Relation Age  of Onset  . Heart disease Brother        valvular dysfunction; s/p replacement  . Hypertension Mother   . Hyperlipidemia Mother   . Hypothyroidism Mother   . Heart disease Father   . Hypertension Father   . Hyperlipidemia Father   . Stomach cancer Maternal Grandmother   . Colon cancer Neg Hx   . Rectal cancer Neg Hx   . Colon polyps Neg Hx     No Known Allergies  Medication list has been reviewed and updated.  Current Outpatient Medications on File Prior to Visit  Medication Sig Dispense Refill  . levothyroxine (SYNTHROID)  100 MCG tablet Take one daily for thyroid 90 tablet 3  . VEMLIDY 25 MG TABS Take 1 tablet by mouth daily.    Marland Kitchen warfarin (COUMADIN) 5 MG tablet Take 1 to 1.5 tablets by mouth daily as directed 120 tablet 0  . FEROSUL 325 (65 Fe) MG tablet TAKE 1 TABLET(325 MG) BY MOUTH TWICE DAILY WITH A MEAL (Patient not taking: Reported on 04/25/2020) 60 tablet 5   No current facility-administered medications on file prior to visit.    Review of Systems:  As per HPI- otherwise negative.   Physical Examination: Vitals:   04/25/20 1328  BP: 104/60  Pulse: 79  Resp: 16  SpO2: 98%   Vitals:   04/25/20 1328  Weight: 134 lb (60.8 kg)  Height: 5\' 4"  (1.626 m)   Body mass index is 23 kg/m. Ideal Body Weight: Weight in (lb) to have BMI = 25: 145.3  GEN: no acute distress.  Looks well, normal weight HEENT: Atraumatic, Normocephalic.   Bilateral TM wnl, oropharynx normal.  PEERL,EOMI.   Mild acne vulgaris is present around her chin Ears and Nose: No external deformity. CV: RRR, No M/G/R. No JVD. No thrill. No extra heart sounds. PULM: CTA B, no wheezes, crackles, rhonchi. No retractions. No resp. distress. No accessory muscle use. ABD: S, NT, ND, +BS. No rebound. No HSM. EXTR: No c/c/e PSYCH: Normally interactive. Conversant.    Assessment and Plan: Physical exam  Acquired hypothyroidism - Plan: TSH  Screening for diabetes mellitus - Plan: Comprehensive metabolic panel  Vitamin D deficiency - Plan: VITAMIN D 25 Hydroxy (Vit-D Deficiency, Fractures)  Screening for hyperlipidemia - Plan: Lipid panel  Iron deficiency anemia, unspecified iron deficiency anemia type - Plan: Ferritin, CANCELED: Ferritin  Acute non-recurrent frontal sinusitis - Plan: HYDROcodone-homatropine (HYCODAN) 5-1.5 MG/5ML syrup  Acne vulgaris - Plan: tretinoin (RETIN-A) 0.01 % gel  Patient here today for a physical exam and a couple of other concerns Labs are pending as above Went over health maintenance Encouraged  healthy diet, she is exercising by walking about 30 minutes a day Recent illness, treated with antibiotics and steroids per urgent care.  She is getting better, encouraged her to use albuterol as needed.  I did refill her Hycodan syrup Retin-A gel for her knee Will plan further follow- up pending labs.  This visit occurred during the SARS-CoV-2 public health emergency.  Safety protocols were in place, including screening questions prior to the visit, additional usage of staff PPE, and extensive cleaning of exam room while observing appropriate contact time as indicated for disinfecting solutions.    Signed Lamar Blinks, MD  Addendum 8/27, received her labs as below.  Call to patient to discuss thyroid, need to adjust dose Explained that we will reduce dose of levothyroxine from 100 to 75 mcg, call in weekly vitamin D supplement.  Patient states understanding,  gives pharmacy preference.  We will get her in for a TSH in 1 month Otherwise labs are okay  Results for orders placed or performed in visit on 04/25/20  Lipid panel  Result Value Ref Range   Cholesterol 176 <200 mg/dL   HDL 50 > OR = 50 mg/dL   Triglycerides 206 (H) <150 mg/dL   LDL Cholesterol (Calc) 96 mg/dL (calc)   Total CHOL/HDL Ratio 3.5 <5.0 (calc)   Non-HDL Cholesterol (Calc) 126 <130 mg/dL (calc)  TSH  Result Value Ref Range   TSH 0.03 (L) mIU/L  VITAMIN D 25 Hydroxy (Vit-D Deficiency, Fractures)  Result Value Ref Range   Vit D, 25-Hydroxy 19 (L) 30 - 100 ng/mL  Comprehensive metabolic panel  Result Value Ref Range   Glucose, Bld 106 (H) 65 - 99 mg/dL   BUN 14 7 - 25 mg/dL   Creat 0.69 0.50 - 1.10 mg/dL   BUN/Creatinine Ratio NOT APPLICABLE 6 - 22 (calc)   Sodium 139 135 - 146 mmol/L   Potassium 4.2 3.5 - 5.3 mmol/L   Chloride 107 98 - 110 mmol/L   CO2 26 20 - 32 mmol/L   Calcium 8.7 8.6 - 10.2 mg/dL   Total Protein 6.7 6.1 - 8.1 g/dL   Albumin 4.0 3.6 - 5.1 g/dL   Globulin 2.7 1.9 - 3.7 g/dL (calc)   AG  Ratio 1.5 1.0 - 2.5 (calc)   Total Bilirubin 0.6 0.2 - 1.2 mg/dL   Alkaline phosphatase (APISO) 40 31 - 125 U/L   AST 14 10 - 35 U/L   ALT 12 6 - 29 U/L  Ferritin  Result Value Ref Range   Ferritin 17 16 - 232 ng/mL

## 2020-04-23 NOTE — Patient Instructions (Addendum)
It was great to see you again today, I will be in touch your labs as soon as possible Ok to use the cough syrup as needed- however use with caution as this can cause downiness and can be habit forming  Please see me in about 6 months Take care!     Health Maintenance, Female Adopting a healthy lifestyle and getting preventive care are important in promoting health and wellness. Ask your health care provider about:  The right schedule for you to have regular tests and exams.  Things you can do on your own to prevent diseases and keep yourself healthy. What should I know about diet, weight, and exercise? Eat a healthy diet   Eat a diet that includes plenty of vegetables, fruits, low-fat dairy products, and lean protein.  Do not eat a lot of foods that are high in solid fats, added sugars, or sodium. Maintain a healthy weight Body mass index (BMI) is used to identify weight problems. It estimates body fat based on height and weight. Your health care provider can help determine your BMI and help you achieve or maintain a healthy weight. Get regular exercise Get regular exercise. This is one of the most important things you can do for your health. Most adults should:  Exercise for at least 150 minutes each week. The exercise should increase your heart rate and make you sweat (moderate-intensity exercise).  Do strengthening exercises at least twice a week. This is in addition to the moderate-intensity exercise.  Spend less time sitting. Even light physical activity can be beneficial. Watch cholesterol and blood lipids Have your blood tested for lipids and cholesterol at 48 years of age, then have this test every 5 years. Have your cholesterol levels checked more often if:  Your lipid or cholesterol levels are high.  You are older than 47 years of age.  You are at high risk for heart disease. What should I know about cancer screening? Depending on your health history and family  history, you may need to have cancer screening at various ages. This may include screening for:  Breast cancer.  Cervical cancer.  Colorectal cancer.  Skin cancer.  Lung cancer. What should I know about heart disease, diabetes, and high blood pressure? Blood pressure and heart disease  High blood pressure causes heart disease and increases the risk of stroke. This is more likely to develop in people who have high blood pressure readings, are of African descent, or are overweight.  Have your blood pressure checked: ? Every 3-5 years if you are 37-62 years of age. ? Every year if you are 36 years old or older. Diabetes Have regular diabetes screenings. This checks your fasting blood sugar level. Have the screening done:  Once every three years after age 58 if you are at a normal weight and have a low risk for diabetes.  More often and at a younger age if you are overweight or have a high risk for diabetes. What should I know about preventing infection? Hepatitis B If you have a higher risk for hepatitis B, you should be screened for this virus. Talk with your health care provider to find out if you are at risk for hepatitis B infection. Hepatitis C Testing is recommended for:  Everyone born from [birth year] through 1963.  Anyone with known risk factors for hepatitis C. Sexually transmitted infections (STIs)  Get screened for STIs, including gonorrhea and chlamydia, if: ? You are sexually active and are younger than 47 years  of age. ? You are older than 47 years of age and your health care provider tells you that you are at risk for this type of infection. ? Your sexual activity has changed since you were last screened, and you are at increased risk for chlamydia or gonorrhea. Ask your health care provider if you are at risk.  Ask your health care provider about whether you are at high risk for HIV. Your health care provider may recommend a prescription medicine to help prevent HIV  infection. If you choose to take medicine to prevent HIV, you should first get tested for HIV. You should then be tested every 3 months for as long as you are taking the medicine. Pregnancy  If you are about to stop having your period (premenopausal) and you may become pregnant, seek counseling before you get pregnant.  Take 400 to 800 micrograms (mcg) of folic acid every day if you become pregnant.  Ask for birth control (contraception) if you want to prevent pregnancy. Osteoporosis and menopause Osteoporosis is a disease in which the bones lose minerals and strength with aging. This can result in bone fractures. If you are 80 years old or older, or if you are at risk for osteoporosis and fractures, ask your health care provider if you should:  Be screened for bone loss.  Take a calcium or vitamin D supplement to lower your risk of fractures.  Be given hormone replacement therapy (HRT) to treat symptoms of menopause. Follow these instructions at home: Lifestyle  Do not use any products that contain nicotine or tobacco, such as cigarettes, e-cigarettes, and chewing tobacco. If you need help quitting, ask your health care provider.  Do not use street drugs.  Do not share needles.  Ask your health care provider for help if you need support or information about quitting drugs. Alcohol use  Do not drink alcohol if: ? Your health care provider tells you not to drink. ? You are pregnant, may be pregnant, or are planning to become pregnant.  If you drink alcohol: ? Limit how much you use to 0-1 drink a day. ? Limit intake if you are breastfeeding.  Be aware of how much alcohol is in your drink. In the [place] one drink equals one 12 oz bottle of beer (355 mL), one 5 oz glass of wine (148 mL), or one 1 oz glass of hard liquor (44 mL). General instructions  Schedule regular health, dental, and eye exams.  Stay current with your vaccines.  Tell your health care provider if: ? You  often feel depressed. ? You have ever been abused or do not feel safe at home. Summary  Adopting a healthy lifestyle and getting preventive care are important in promoting health and wellness.  Follow your health care provider's instructions about healthy diet, exercising, and getting tested or screened for diseases.  Follow your health care provider's instructions on monitoring your cholesterol and blood pressure. This information is not intended to replace advice given to you by your health care provider. Make sure you discuss any questions you have with your health care provider. Document Revised: 08/11/2018 Document Reviewed: 08/11/2018 Elsevier Patient Education  2020 [employer].

## 2020-04-25 ENCOUNTER — Encounter: Payer: Self-pay | Admitting: Family Medicine

## 2020-04-25 ENCOUNTER — Other Ambulatory Visit: Payer: Self-pay

## 2020-04-25 ENCOUNTER — Ambulatory Visit (INDEPENDENT_AMBULATORY_CARE_PROVIDER_SITE_OTHER): Payer: 59 | Admitting: Family Medicine

## 2020-04-25 VITALS — BP 104/60 | HR 79 | Resp 16 | Ht 64.0 in | Wt 134.0 lb

## 2020-04-25 DIAGNOSIS — E039 Hypothyroidism, unspecified: Secondary | ICD-10-CM

## 2020-04-25 DIAGNOSIS — Z131 Encounter for screening for diabetes mellitus: Secondary | ICD-10-CM | POA: Diagnosis not present

## 2020-04-25 DIAGNOSIS — L700 Acne vulgaris: Secondary | ICD-10-CM

## 2020-04-25 DIAGNOSIS — Z13220 Encounter for screening for lipoid disorders: Secondary | ICD-10-CM

## 2020-04-25 DIAGNOSIS — E559 Vitamin D deficiency, unspecified: Secondary | ICD-10-CM

## 2020-04-25 DIAGNOSIS — J0110 Acute frontal sinusitis, unspecified: Secondary | ICD-10-CM

## 2020-04-25 DIAGNOSIS — D509 Iron deficiency anemia, unspecified: Secondary | ICD-10-CM

## 2020-04-25 DIAGNOSIS — Z0000 Encounter for general adult medical examination without abnormal findings: Secondary | ICD-10-CM | POA: Diagnosis not present

## 2020-04-25 LAB — COMPREHENSIVE METABOLIC PANEL
AG Ratio: 1.5 (calc) (ref 1.0–2.5)
ALT: 12 U/L (ref 6–29)
AST: 14 U/L (ref 10–35)
Albumin: 4 g/dL (ref 3.6–5.1)
Alkaline phosphatase (APISO): 40 U/L (ref 31–125)
BUN: 14 mg/dL (ref 7–25)
CO2: 26 mmol/L (ref 20–32)
Calcium: 8.7 mg/dL (ref 8.6–10.2)
Chloride: 107 mmol/L (ref 98–110)
Creat: 0.69 mg/dL (ref 0.50–1.10)
Globulin: 2.7 g/dL (calc) (ref 1.9–3.7)
Glucose, Bld: 106 mg/dL — ABNORMAL HIGH (ref 65–99)
Potassium: 4.2 mmol/L (ref 3.5–5.3)
Sodium: 139 mmol/L (ref 135–146)
Total Bilirubin: 0.6 mg/dL (ref 0.2–1.2)
Total Protein: 6.7 g/dL (ref 6.1–8.1)

## 2020-04-25 LAB — LIPID PANEL
Cholesterol: 176 mg/dL (ref <200)
HDL: 50 mg/dL (ref >=50)
LDL Cholesterol (Calc): 96 mg/dL (calc)
Non-HDL Cholesterol (Calc): 126 mg/dL (calc) (ref <130)
Total CHOL/HDL Ratio: 3.5 (calc) (ref <5.0)
Triglycerides: 206 mg/dL — ABNORMAL HIGH (ref <150)

## 2020-04-25 LAB — FERRITIN: Ferritin: 17 ng/mL (ref 16–232)

## 2020-04-25 LAB — VITAMIN D 25 HYDROXY (VIT D DEFICIENCY, FRACTURES): Vit D, 25-Hydroxy: 19 ng/mL — ABNORMAL LOW (ref 30–100)

## 2020-04-25 LAB — TSH: TSH: 0.03 mIU/L — ABNORMAL LOW

## 2020-04-25 MED ORDER — TRETINOIN 0.01 % EX GEL: Freq: Every day | CUTANEOUS | 1 refills | Status: DC

## 2020-04-25 MED ORDER — HYDROCODONE-HOMATROPINE 5-1.5 MG/5ML PO SYRP: 5.0000 mL | ORAL_SOLUTION | Freq: Three times a day (TID) | ORAL | 0 refills | Status: AC | PRN

## 2020-04-27 MED ORDER — LEVOTHYROXINE SODIUM 75 MCG PO TABS: ORAL_TABLET | ORAL | 6 refills | Status: DC

## 2020-04-27 MED ORDER — VITAMIN D3 1.25 MG (50000 UT) PO CAPS: ORAL_CAPSULE | ORAL | 0 refills | Status: DC

## 2020-04-27 NOTE — Addendum Note (Signed)
Addended by: Darreld Mclean on: 04/27/2020 06:13 PM   Modules accepted: Orders

## 2020-04-30 ENCOUNTER — Telehealth: Payer: Self-pay

## 2020-04-30 NOTE — Telephone Encounter (Signed)
PA initiated via Covermymeds; KEY: [id number]. Awaiting determination.

## 2020-05-01 NOTE — Telephone Encounter (Signed)
PA approved. Effective 04/30/2020 to 04/30/2021.

## 2020-05-04 ENCOUNTER — Ambulatory Visit (INDEPENDENT_AMBULATORY_CARE_PROVIDER_SITE_OTHER): Payer: 59

## 2020-05-04 ENCOUNTER — Other Ambulatory Visit: Payer: Self-pay

## 2020-05-04 DIAGNOSIS — Z7901 Long term (current) use of anticoagulants: Secondary | ICD-10-CM | POA: Diagnosis not present

## 2020-05-04 DIAGNOSIS — I359 Nonrheumatic aortic valve disorder, unspecified: Secondary | ICD-10-CM | POA: Diagnosis not present

## 2020-05-04 DIAGNOSIS — Z5181 Encounter for therapeutic drug level monitoring: Secondary | ICD-10-CM

## 2020-05-04 DIAGNOSIS — Z952 Presence of prosthetic heart valve: Secondary | ICD-10-CM | POA: Diagnosis not present

## 2020-05-04 LAB — POCT INR: INR: 2.4 (ref 2.0–3.0)

## 2020-05-04 NOTE — Patient Instructions (Addendum)
Continue taking 1 tablet daily.  Repeat INR in 5 weeks *Okay to eat green vegetables twice per week*   *Patient will let us know if interested in taking part of Eliquis/On-X clinical tria

## 2020-05-09 ENCOUNTER — Other Ambulatory Visit: Payer: Self-pay | Admitting: Obstetrics and Gynecology

## 2020-05-30 ENCOUNTER — Ambulatory Visit (INDEPENDENT_AMBULATORY_CARE_PROVIDER_SITE_OTHER): Payer: 59

## 2020-05-30 ENCOUNTER — Other Ambulatory Visit: Payer: Self-pay

## 2020-05-30 ENCOUNTER — Other Ambulatory Visit: Payer: 59

## 2020-05-30 DIAGNOSIS — E039 Hypothyroidism, unspecified: Secondary | ICD-10-CM

## 2020-05-30 DIAGNOSIS — Z23 Encounter for immunization: Secondary | ICD-10-CM | POA: Diagnosis not present

## 2020-05-30 LAB — TSH: TSH: 0.55 mIU/L

## 2020-05-30 NOTE — Addendum Note (Signed)
Addended by: Kelle Darting A on: 05/30/2020 10:59 AM   Modules accepted: Orders

## 2020-05-30 NOTE — Addendum Note (Signed)
Addended by: Kelle Darting A on: 05/30/2020 10:54 AM   Modules accepted: Orders

## 2020-05-30 NOTE — Progress Notes (Signed)
Pt here today for Flu vaccine. Pt denies allergy to eggs and has not had covid vaccines or booster within the last 2 weeks.   Fluarix Quadrivalent 0.18mL injected into L deltoid. Pt tolerated injection well.

## 2020-05-31 ENCOUNTER — Encounter: Payer: Self-pay | Admitting: Family Medicine

## 2020-06-06 ENCOUNTER — Ambulatory Visit (INDEPENDENT_AMBULATORY_CARE_PROVIDER_SITE_OTHER): Payer: 59

## 2020-06-06 ENCOUNTER — Other Ambulatory Visit: Payer: Self-pay

## 2020-06-06 DIAGNOSIS — Z952 Presence of prosthetic heart valve: Secondary | ICD-10-CM | POA: Diagnosis not present

## 2020-06-06 DIAGNOSIS — I359 Nonrheumatic aortic valve disorder, unspecified: Secondary | ICD-10-CM | POA: Diagnosis not present

## 2020-06-06 DIAGNOSIS — Z7901 Long term (current) use of anticoagulants: Secondary | ICD-10-CM | POA: Diagnosis not present

## 2020-06-06 DIAGNOSIS — Z5181 Encounter for therapeutic drug level monitoring: Secondary | ICD-10-CM

## 2020-06-06 LAB — POCT INR: INR: 2.7 (ref 2.0–3.0)

## 2020-06-06 NOTE — Patient Instructions (Signed)
Continue taking 1 tablet daily.  Repeat INR in 6 weeks *Okay to eat green vegetables twice per week*   *Patient will let us know if interested in taking part of Eliquis/On-X clinical trial*  

## 2020-07-18 ENCOUNTER — Ambulatory Visit (INDEPENDENT_AMBULATORY_CARE_PROVIDER_SITE_OTHER): Payer: 59

## 2020-07-18 DIAGNOSIS — Z5181 Encounter for therapeutic drug level monitoring: Secondary | ICD-10-CM | POA: Diagnosis not present

## 2020-07-18 DIAGNOSIS — Z7901 Long term (current) use of anticoagulants: Secondary | ICD-10-CM

## 2020-07-18 DIAGNOSIS — I359 Nonrheumatic aortic valve disorder, unspecified: Secondary | ICD-10-CM | POA: Diagnosis not present

## 2020-07-18 DIAGNOSIS — Z952 Presence of prosthetic heart valve: Secondary | ICD-10-CM | POA: Diagnosis not present

## 2020-07-18 LAB — POCT INR: INR: 2.4 (ref 2.0–3.0)

## 2020-07-18 NOTE — Patient Instructions (Signed)
Continue taking 1 tablet daily.  Repeat INR in 6 weeks *Okay to eat green vegetables twice per week*   *Patient will let us know if interested in taking part of Eliquis/On-X clinical trial*

## 2020-08-18 ENCOUNTER — Other Ambulatory Visit: Payer: Self-pay | Admitting: Cardiology

## 2020-08-29 ENCOUNTER — Ambulatory Visit (INDEPENDENT_AMBULATORY_CARE_PROVIDER_SITE_OTHER): Payer: 59

## 2020-08-29 ENCOUNTER — Other Ambulatory Visit: Payer: Self-pay

## 2020-08-29 DIAGNOSIS — Z5181 Encounter for therapeutic drug level monitoring: Secondary | ICD-10-CM | POA: Diagnosis not present

## 2020-08-29 DIAGNOSIS — Z7901 Long term (current) use of anticoagulants: Secondary | ICD-10-CM | POA: Diagnosis not present

## 2020-08-29 DIAGNOSIS — Z952 Presence of prosthetic heart valve: Secondary | ICD-10-CM

## 2020-08-29 DIAGNOSIS — I359 Nonrheumatic aortic valve disorder, unspecified: Secondary | ICD-10-CM | POA: Diagnosis not present

## 2020-08-29 LAB — POCT INR: INR: 2 (ref 2.0–3.0)

## 2020-08-29 NOTE — Patient Instructions (Signed)
Continue taking 1 tablet daily.  Repeat INR in 6 weeks   *Patient will let us know if interested in taking part of Eliquis/On-X clinical trial*

## 2020-08-30 ENCOUNTER — Other Ambulatory Visit: Payer: Self-pay | Admitting: Family Medicine

## 2020-08-30 DIAGNOSIS — E039 Hypothyroidism, unspecified: Secondary | ICD-10-CM

## 2020-09-26 ENCOUNTER — Other Ambulatory Visit: Payer: Self-pay | Admitting: Nurse Practitioner

## 2020-09-26 DIAGNOSIS — B181 Chronic viral hepatitis B without delta-agent: Secondary | ICD-10-CM

## 2020-10-16 ENCOUNTER — Other Ambulatory Visit: Payer: 59

## 2020-10-17 ENCOUNTER — Ambulatory Visit (INDEPENDENT_AMBULATORY_CARE_PROVIDER_SITE_OTHER): Payer: 59

## 2020-10-17 ENCOUNTER — Other Ambulatory Visit: Payer: Self-pay

## 2020-10-17 ENCOUNTER — Ambulatory Visit
Admission: RE | Admit: 2020-10-17 | Discharge: 2020-10-17 | Disposition: A | Discharge status: Home / Self Care | Payer: 59 | Source: Ambulatory Visit | Attending: Nurse Practitioner | Admitting: Nurse Practitioner

## 2020-10-17 DIAGNOSIS — I359 Nonrheumatic aortic valve disorder, unspecified: Secondary | ICD-10-CM | POA: Diagnosis not present

## 2020-10-17 DIAGNOSIS — Z952 Presence of prosthetic heart valve: Secondary | ICD-10-CM

## 2020-10-17 DIAGNOSIS — Z7901 Long term (current) use of anticoagulants: Secondary | ICD-10-CM

## 2020-10-17 DIAGNOSIS — Z5181 Encounter for therapeutic drug level monitoring: Secondary | ICD-10-CM

## 2020-10-17 DIAGNOSIS — B181 Chronic viral hepatitis B without delta-agent: Secondary | ICD-10-CM

## 2020-10-17 LAB — POCT INR: INR: 2.5 (ref 2.0–3.0)

## 2020-10-17 NOTE — Patient Instructions (Signed)
Continue taking 1 tablet daily. Repeat INR in 6 weeks. 

## 2020-10-23 ENCOUNTER — Other Ambulatory Visit: Payer: Self-pay | Admitting: Cardiology

## 2020-10-28 NOTE — Progress Notes (Addendum)
[facility] [place], [place],  [address] [phone]  Date:  10/31/2020   Name:  [name]   DOB:  [birth year]   MRN:  [id number]  PCP:  Darreld Mclean, MD    Chief Complaint: Hypothyroidism   History of Present Illness:  [name] is a 50 y.o. very pleasant female patient who presents with the following:  Patient here today for 1-month follow-up visit Last seen by myself in August  history of post radiation hypothyroidism, hepatitis B, aortic valve disorder due to rheumatic fever with valve replacement on Coumadin, vitamin D deficiency Last seen by myself in January of this year Her anticoagulation is managed by Coumadin clinic Hematologist for iron deficiency anemia is Dr. Tamala Julian with [facility]  COVID-19 vaccine booster-reminded patient Pap is up-to-date Mammogram- per pt this is UTD  In August, vitamin D low at that time  She has really dry and cracked skin on her fingertips- possible dyshidrotic eczema  She has been able to improve this some through careful moisturization Patient Active Problem List   Diagnosis Date Noted  . Hypothyroidism, postradioiodine therapy 02/04/2017  . Vasovagal syncope 05/02/2015  . Hepatitis B 04/28/2012  . Aortic valve disorder 02/11/2012  . Long term (current) use of anticoagulants 02/11/2012  . S/P AVR 12/16/2011  . Chronic anticoagulation 12/16/2011  . PULMONARY NODULE 01/31/2010  . DYSPNEA 01/31/2010    Past Medical History:  Diagnosis Date  . Femur fracture, left ([place]) 1990   with open reduction and internal fixation   . H/O: rheumatic fever   . Hepatitis B carrier ([place])    positive hepatitis B carrier; maintained on Baraclude; followed by Hepatitis clinic.  Marland Kitchen History of allergic rhinitis   . Severe aortic insufficiency 09/01/2004   s/p aortic valve replacement.      Past Surgical  History:  Procedure Laterality Date  . AORTIC VALVE REPLACEMENT  4/06   53mm On-X mechanical valve  . CARDIAC CATHETERIZATION  11/15/2004   Ejection fraction is estimated 55%  . HEMORRHOID SURGERY      Social History   Tobacco Use  . Smoking status: Never Smoker  . Smokeless tobacco: Never Used  Vaping Use  . Vaping Use: Never used  Substance Use Topics  . Alcohol use: No    Alcohol/week: 0.0 standard drinks  . Drug use: No    Family History  Problem Relation Age of Onset  . Heart disease Brother        valvular dysfunction; s/p replacement  . Hypertension Mother   . Hyperlipidemia Mother   . Hypothyroidism Mother   . Heart disease Father   . Hypertension Father   . Hyperlipidemia Father   . Stomach cancer Maternal Grandmother   . Colon cancer Neg Hx   . Rectal cancer Neg Hx   . Colon polyps Neg Hx     No Known Allergies  Medication list has been reviewed and updated.  Current Outpatient Medications on File Prior to Visit  Medication Sig Dispense Refill  . Cholecalciferol (VITAMIN D3) 1.25 MG (50000 UT) CAPS Take 1 weekly for 12 weeks 12 capsule 0  . levothyroxine (SYNTHROID) 75 MCG tablet Take 1 tablet (75 mcg total) by mouth daily before breakfast. 90 tablet 0  . VEMLIDY 25 MG TABS Take 1 tablet by mouth daily.    Marland Kitchen warfarin (COUMADIN) 5  MG tablet TAKE 1 TO 1 AND 1/2 TABLETS BY MOUTH DAILY AS DIRECTED 120 tablet 0   No current facility-administered medications on file prior to visit.    Review of Systems:  As per HPI- otherwise negative.   Physical Examination: Vitals:   10/31/20 1005  BP: 118/72  Pulse: 75  Resp: 17  Temp: 98.4 F (36.9 C)  SpO2: 95%   Vitals:   10/31/20 1005  Weight: 133 lb (60.3 kg)  Height: 5\' 4"  (1.626 m)   Body mass index is 22.83 kg/m. Ideal Body Weight: Weight in (lb) to have BMI = 25: 145.3  GEN: no acute distress.  Normal weight, looks well HEENT: Atraumatic, Normocephalic.  Ears and Nose: No external  deformity. CV: RRR, No M/G/R. No JVD. No thrill. No extra heart sounds.  Mechanical valve sound is noted PULM: CTA B, no wheezes, crackles, rhonchi. No retractions. No resp. distress. No accessory muscle use. ABD: S, NT, ND, +BS. No rebound. No HSM. EXTR: No c/c/e PSYCH: Normally interactive. Conversant.  She has dry and cracked skin on some of her fingertips and surrounding the nailbeds consistent with dyshidrotic eczema  Assessment and Plan: Vitamin D deficiency - Plan: VITAMIN D 25 Hydroxy (Vit-D Deficiency, Fractures)  Acquired hypothyroidism - Plan: TSH  Chronic anticoagulation - Plan: Basic metabolic panel, CBC  Dyshidrotic eczema - Plan: triamcinolone ointment (KENALOG) 0.5 %  Periodic follow-up visit today INRs managed by cardiology anticoagulation clinic.  We will check blood counts as above today Follow-up on hypothyroidism-check TSH.  Taking levothyroxine 75 mcg Follow-up on vitamin D, patient is currently taking 1000 international units over-the-counter Discussed her dyshidrotic eczema.  Encouraged her to minimize exposure to water, especially hot water.  Dry hands promptly after washing.  Continue thick moisturizer such as Vaseline during the day.  Can use the triamcinolone ointment at bedtime as needed This visit occurred during the SARS-CoV-2 public health emergency.  Safety protocols were in place, including screening questions prior to the visit, additional usage of staff PPE, and extensive cleaning of exam room while observing appropriate contact time as indicated for disinfecting solutions.    Signed Lamar Blinks, MD  3/3, received her labs as below.  Letter to patient  Results for orders placed or performed in visit on 78/58/85  Basic metabolic panel  Result Value Ref Range   Sodium 138 135 - 145 mEq/L   Potassium 4.2 3.5 - 5.1 mEq/L   Chloride 105 96 - 112 mEq/L   CO2 30 19 - 32 mEq/L   Glucose, Bld 80 70 - 99 mg/dL   BUN 16 6 - 23 mg/dL   Creatinine,  Ser 0.61 0.40 - 1.20 mg/dL   GFR 106.03 >60.00 mL/min   Calcium 9.1 8.4 - 10.5 mg/dL  CBC  Result Value Ref Range   WBC 4.1 4.0 - 10.5 K/uL   RBC 4.47 3.87 - 5.11 Mil/uL   Platelets 251.0 150.0 - 400.0 K/uL   Hemoglobin 12.5 12.0 - 15.0 g/dL   HCT 38.6 36.0 - 46.0 %   MCV 86.4 78.0 - 100.0 fl   MCHC 32.4 30.0 - 36.0 g/dL   RDW 14.9 11.5 - 15.5 %  VITAMIN D 25 Hydroxy (Vit-D Deficiency, Fractures)  Result Value Ref Range   VITD 39.60 30.00 - 100.00 ng/mL  TSH  Result Value Ref Range   TSH 1.78 0.35 - 4.50 uIU/mL

## 2020-10-28 NOTE — Patient Instructions (Signed)
It was great to see you again today, I will be in touch with your labs as soon as possible I think your hand skin is cracked due to dryness and cold weather continue to moisturize your skin with Vaseline Minimize expos rue to hot water- dry your hands right away after washing  We gave you a steroid ointment to use at night as needed for your skin    Health Maintenance, Female Adopting a healthy lifestyle and getting preventive care are important in promoting health and wellness. Ask your health care provider about:  The right schedule for you to have regular tests and exams.  Things you can do on your own to prevent diseases and keep yourself healthy. What should I know about diet, weight, and exercise? Eat a healthy diet  Eat a diet that includes plenty of vegetables, fruits, low-fat dairy products, and lean protein.  Do not eat a lot of foods that are high in solid fats, added sugars, or sodium.   Maintain a healthy weight Body mass index (BMI) is used to identify weight problems. It estimates body fat based on height and weight. Your health care provider can help determine your BMI and help you achieve or maintain a healthy weight. Get regular exercise Get regular exercise. This is one of the most important things you can do for your health. Most adults should:  Exercise for at least 150 minutes each week. The exercise should increase your heart rate and make you sweat (moderate-intensity exercise).  Do strengthening exercises at least twice a week. This is in addition to the moderate-intensity exercise.  Spend less time sitting. Even light physical activity can be beneficial. Watch cholesterol and blood lipids Have your blood tested for lipids and cholesterol at 48 years of age, then have this test every 5 years. Have your cholesterol levels checked more often if:  Your lipid or cholesterol levels are high.  You are older than 49 years of age.  You are at high risk for heart  disease. What should I know about cancer screening? Depending on your health history and family history, you may need to have cancer screening at various ages. This may include screening for:  Breast cancer.  Cervical cancer.  Colorectal cancer.  Skin cancer.  Lung cancer. What should I know about heart disease, diabetes, and high blood pressure? Blood pressure and heart disease  High blood pressure causes heart disease and increases the risk of stroke. This is more likely to develop in people who have high blood pressure readings, are of African descent, or are overweight.  Have your blood pressure checked: ? Every 3-5 years if you are 66-51 years of age. ? Every year if you are 63 years old or older. Diabetes Have regular diabetes screenings. This checks your fasting blood sugar level. Have the screening done:  Once every three years after age 24 if you are at a normal weight and have a low risk for diabetes.  More often and at a younger age if you are overweight or have a high risk for diabetes. What should I know about preventing infection? Hepatitis B If you have a higher risk for hepatitis B, you should be screened for this virus. Talk with your health care provider to find out if you are at risk for hepatitis B infection. Hepatitis C Testing is recommended for:  Everyone born from [birth year] through 1963.  Anyone with known risk factors for hepatitis C. Sexually transmitted infections (STIs)  Get screened  for STIs, including gonorrhea and chlamydia, if: ? You are sexually active and are younger than 48 years of age. ? You are older than 48 years of age and your health care provider tells you that you are at risk for this type of infection. ? Your sexual activity has changed since you were last screened, and you are at increased risk for chlamydia or gonorrhea. Ask your health care provider if you are at risk.  Ask your health care provider about whether you are at high  risk for HIV. Your health care provider may recommend a prescription medicine to help prevent HIV infection. If you choose to take medicine to prevent HIV, you should first get tested for HIV. You should then be tested every 3 months for as long as you are taking the medicine. Pregnancy  If you are about to stop having your period (premenopausal) and you may become pregnant, seek counseling before you get pregnant.  Take 400 to 800 micrograms (mcg) of folic acid every day if you become pregnant.  Ask for birth control (contraception) if you want to prevent pregnancy. Osteoporosis and menopause Osteoporosis is a disease in which the bones lose minerals and strength with aging. This can result in bone fractures. If you are 69 years old or older, or if you are at risk for osteoporosis and fractures, ask your health care provider if you should:  Be screened for bone loss.  Take a calcium or vitamin D supplement to lower your risk of fractures.  Be given hormone replacement therapy (HRT) to treat symptoms of menopause. Follow these instructions at home: Lifestyle  Do not use any products that contain nicotine or tobacco, such as cigarettes, e-cigarettes, and chewing tobacco. If you need help quitting, ask your health care provider.  Do not use street drugs.  Do not share needles.  Ask your health care provider for help if you need support or information about quitting drugs. Alcohol use  Do not drink alcohol if: ? Your health care provider tells you not to drink. ? You are pregnant, may be pregnant, or are planning to become pregnant.  If you drink alcohol: ? Limit how much you use to 0-1 drink a day. ? Limit intake if you are breastfeeding.  Be aware of how much alcohol is in your drink. In the [place] one drink equals one 12 oz bottle of beer (355 mL), one 5 oz glass of wine (148 mL), or one 1 oz glass of hard liquor (44 mL). General instructions  Schedule regular health, dental,  and eye exams.  Stay current with your vaccines.  Tell your health care provider if: ? You often feel depressed. ? You have ever been abused or do not feel safe at home. Summary  Adopting a healthy lifestyle and getting preventive care are important in promoting health and wellness.  Follow your health care provider's instructions about healthy diet, exercising, and getting tested or screened for diseases.  Follow your health care provider's instructions on monitoring your cholesterol and blood pressure. This information is not intended to replace advice given to you by your health care provider. Make sure you discuss any questions you have with your health care provider. Document Revised: 08/11/2018 Document Reviewed: 08/11/2018 Elsevier Patient Education  2021 [employer].

## 2020-10-31 ENCOUNTER — Ambulatory Visit (INDEPENDENT_AMBULATORY_CARE_PROVIDER_SITE_OTHER): Payer: 59 | Admitting: Family Medicine

## 2020-10-31 ENCOUNTER — Encounter: Payer: Self-pay | Admitting: Family Medicine

## 2020-10-31 ENCOUNTER — Other Ambulatory Visit: Payer: Self-pay

## 2020-10-31 VITALS — BP 118/72 | HR 75 | Temp 98.4°F | Resp 17 | Ht 64.0 in | Wt 133.0 lb

## 2020-10-31 DIAGNOSIS — Z0000 Encounter for general adult medical examination without abnormal findings: Secondary | ICD-10-CM | POA: Diagnosis not present

## 2020-10-31 DIAGNOSIS — E039 Hypothyroidism, unspecified: Secondary | ICD-10-CM

## 2020-10-31 DIAGNOSIS — E559 Vitamin D deficiency, unspecified: Secondary | ICD-10-CM | POA: Diagnosis not present

## 2020-10-31 DIAGNOSIS — L301 Dyshidrosis [pompholyx]: Secondary | ICD-10-CM

## 2020-10-31 DIAGNOSIS — Z7901 Long term (current) use of anticoagulants: Secondary | ICD-10-CM

## 2020-10-31 LAB — CBC
HCT: 38.6 % (ref 36.0–46.0)
Hemoglobin: 12.5 g/dL (ref 12.0–15.0)
MCHC: 32.4 g/dL (ref 30.0–36.0)
MCV: 86.4 fl (ref 78.0–100.0)
Platelets: 251 10*3/uL (ref 150.0–400.0)
RBC: 4.47 Mil/uL (ref 3.87–5.11)
RDW: 14.9 % (ref 11.5–15.5)
WBC: 4.1 10*3/uL (ref 4.0–10.5)

## 2020-10-31 LAB — BASIC METABOLIC PANEL
BUN: 16 mg/dL (ref 6–23)
CO2: 30 mEq/L (ref 19–32)
Calcium: 9.1 mg/dL (ref 8.4–10.5)
Chloride: 105 mEq/L (ref 96–112)
Creatinine, Ser: 0.61 mg/dL (ref 0.40–1.20)
GFR: 106.03 mL/min (ref >60.00)
Glucose, Bld: 80 mg/dL (ref 70–99)
Potassium: 4.2 mEq/L (ref 3.5–5.1)
Sodium: 138 mEq/L (ref 135–145)

## 2020-10-31 MED ORDER — TRIAMCINOLONE ACETONIDE 0.5 % EX OINT: 1.0000 "application " | TOPICAL_OINTMENT | Freq: Two times a day (BID) | CUTANEOUS | 2 refills | Status: DC

## 2020-11-01 LAB — VITAMIN D 25 HYDROXY (VIT D DEFICIENCY, FRACTURES): VITD: 39.6 ng/mL (ref 30.00–100.00)

## 2020-11-01 LAB — TSH: TSH: 1.78 u[IU]/mL (ref 0.35–4.50)

## 2020-11-12 ENCOUNTER — Telehealth: Payer: Self-pay | Admitting: Family Medicine

## 2020-11-12 NOTE — Telephone Encounter (Signed)
Patient is  Requesting a referral for an eye doctor  Please advice

## 2020-11-14 NOTE — Telephone Encounter (Signed)
Spoke with patient. Pt states wanting to have eyes checked. Pt advised that eye doctor would be a self referral

## 2020-11-28 ENCOUNTER — Other Ambulatory Visit: Payer: Self-pay

## 2020-11-28 ENCOUNTER — Ambulatory Visit (INDEPENDENT_AMBULATORY_CARE_PROVIDER_SITE_OTHER): Payer: 59

## 2020-11-28 DIAGNOSIS — Z5181 Encounter for therapeutic drug level monitoring: Secondary | ICD-10-CM

## 2020-11-28 DIAGNOSIS — I359 Nonrheumatic aortic valve disorder, unspecified: Secondary | ICD-10-CM | POA: Diagnosis not present

## 2020-11-28 DIAGNOSIS — Z952 Presence of prosthetic heart valve: Secondary | ICD-10-CM

## 2020-11-28 DIAGNOSIS — Z7901 Long term (current) use of anticoagulants: Secondary | ICD-10-CM | POA: Diagnosis not present

## 2020-11-28 LAB — POCT INR: INR: 3.5 — AB (ref 2.0–3.0)

## 2020-11-28 NOTE — Patient Instructions (Signed)
Hold tonight only and then Continue taking 1 tablet daily.  Repeat INR in 6 weeks

## 2020-12-03 ENCOUNTER — Other Ambulatory Visit: Payer: Self-pay | Admitting: Family Medicine

## 2020-12-03 ENCOUNTER — Other Ambulatory Visit: Payer: Self-pay | Admitting: Cardiology

## 2020-12-03 DIAGNOSIS — E039 Hypothyroidism, unspecified: Secondary | ICD-10-CM

## 2021-01-09 ENCOUNTER — Ambulatory Visit (INDEPENDENT_AMBULATORY_CARE_PROVIDER_SITE_OTHER): Payer: 59

## 2021-01-09 ENCOUNTER — Other Ambulatory Visit: Payer: Self-pay

## 2021-01-09 DIAGNOSIS — Z7901 Long term (current) use of anticoagulants: Secondary | ICD-10-CM | POA: Diagnosis not present

## 2021-01-09 DIAGNOSIS — Z5181 Encounter for therapeutic drug level monitoring: Secondary | ICD-10-CM | POA: Diagnosis not present

## 2021-01-09 DIAGNOSIS — I359 Nonrheumatic aortic valve disorder, unspecified: Secondary | ICD-10-CM | POA: Diagnosis not present

## 2021-01-09 DIAGNOSIS — Z952 Presence of prosthetic heart valve: Secondary | ICD-10-CM | POA: Diagnosis not present

## 2021-01-09 LAB — POCT INR: INR: 3 (ref 2.0–3.0)

## 2021-01-09 NOTE — Patient Instructions (Signed)
Continue taking 1 tablet daily. Repeat INR in 6 weeks. 

## 2021-01-13 NOTE — Progress Notes (Signed)
[name] Date of Birth: Aug 16, 1971 Medical Record #[id number]  History of Present Illness: [name] is seen today for followup. She has a history of severe aortic insufficiency and underwent aortic valve replacement with a mechanical prosthesis in April of 2006. This was a #21 mm On-X valve. She does have some chronic mild dyspnea. She's had extensive pulmonary and cardiac evaluation for this which has been unremarkable. She is on chronic anticoagulation. She does have a history of hepatitis B and hyperthyroidism s/p radioactive iodine ablation.  On follow up today she is doing well.  She states she is walking daily. She is working. She denies any chest pain or palpitations. Energy level is good. Only occasional dyspnea. She does note her father passed this year with an MI age 73.   Current Outpatient Medications on File Prior to Visit  Medication Sig Dispense Refill  . Cholecalciferol 1.25 MG (50000 UT) TABS Take 1.25 mg by mouth. Take 1 Tablet Every 7 Days    . levothyroxine (SYNTHROID) 75 MCG tablet Take 1 tablet (75 mcg total) by mouth daily before breakfast. 90 tablet 1  . triamcinolone ointment (KENALOG) 0.5 % Apply 1 application topically 2 (two) times daily. Use as needed for dry and cracked skin on fingers 60 g 2  . VEMLIDY 25 MG TABS Take 1 tablet by mouth daily.     No current facility-administered medications on file prior to visit.    No Known Allergies  Past Medical History:  Diagnosis Date  . Femur fracture, left ([place]) 1990   with open reduction and internal fixation   . H/O: rheumatic fever   . Hepatitis B carrier ([place])    positive hepatitis B carrier; maintained on Baraclude; followed by Hepatitis clinic.  Marland Kitchen History of allergic rhinitis   . Severe aortic insufficiency 09/01/2004   s/p aortic valve replacement.      Past Surgical History:  Procedure Laterality Date  . AORTIC VALVE REPLACEMENT  4/06   27mm On-X mechanical valve  . CARDIAC CATHETERIZATION  11/15/2004    Ejection fraction is estimated 55%  . HEMORRHOID SURGERY      Social History   Tobacco Use  Smoking Status Never Smoker  Smokeless Tobacco Never Used    Social History   Substance and Sexual Activity  Alcohol Use No  . Alcohol/week: 0.0 standard drinks    Family History  Problem Relation Age of Onset  . Heart disease Brother        valvular dysfunction; s/p replacement  . Hypertension Mother   . Hyperlipidemia Mother   . Hypothyroidism Mother   . Heart disease Father   . Hypertension Father   . Hyperlipidemia Father   . Stomach cancer Maternal Grandmother   . Colon cancer Neg Hx   . Rectal cancer Neg Hx   . Colon polyps Neg Hx     Review of Systems: As noted in history of present illness.  All other systems were reviewed and are negative.  Physical Exam: BP 120/80   Pulse 65   Ht 5\' 4"  (1.626 m)   Wt 134 lb 6.4 oz (61 kg)   SpO2 98%   BMI 23.07 kg/m  GENERAL:  Well appearing Asian female in NAD HEENT:  PERRL, EOMI, sclera are clear. Oropharynx is clear. NECK:  No jugular venous distention, carotid upstroke brisk and symmetric, no bruits, no thyromegaly or adenopathy LUNGS:  Clear to auscultation bilaterally CHEST:  Unremarkable HEART:  RRR,  PMI not displaced or sustained,  normal mechanical AV click, no S3, no S4: no clicks, no rubs, no murmurs ABD:  Soft, nontender. BS +, no masses or bruits. No hepatomegaly, no splenomegaly EXT:  2 + pulses throughout, no edema, no cyanosis no clubbing SKIN:  Warm and dry.  No rashes NEURO:  Alert and oriented x 3. Cranial nerves II through XII intact. PSYCH:  Cognitively intact    LABORATORY DATA:  Lab Results  Component Value Date   WBC 4.1 10/31/2020   HGB 12.5 10/31/2020   HCT 38.6 10/31/2020   PLT 251.0 10/31/2020   GLUCOSE 80 10/31/2020   CHOL 176 04/25/2020   TRIG 206 (H) 04/25/2020   HDL 50 04/25/2020   LDLCALC 96 04/25/2020   ALT 12 04/25/2020   AST 14 04/25/2020   NA 138 10/31/2020   K 4.2  10/31/2020   CL 105 10/31/2020   CREATININE 0.61 10/31/2020   BUN 16 10/31/2020   CO2 30 10/31/2020   TSH 1.78 10/31/2020   INR 3.0 01/09/2021   HGBA1C 5.3 08/11/2018   Ecg today shows NSR rate 65.  Nonspecific TWA. I have personally reviewed and interpreted this study.  Echo 12/12/14:Study Conclusions  - Left ventricle: The cavity size was normal. Wall thickness was normal. Systolic function was normal. The estimated ejection fraction was in the range of 55% to 60%. Left ventricular diastolic function parameters were normal. - Aortic valve: There was a mechanical aortic valve. No significant stenosis. There was trivial regurgitation. Mean gradient (S): 11 mm Hg. - Mitral valve: There was mild regurgitation. - Right ventricle: The cavity size was normal. Systolic function was normal. - Tricuspid valve: Peak RV-RA gradient (S): 22 mm Hg. - Pulmonary arteries: PA peak pressure: 25 mm Hg (S). - Inferior vena cava: The vessel was normal in size. The respirophasic diameter changes were in the normal range (= 50%), consistent with normal central venous pressure.  Impressions:  - Normal LV size with EF 55-60%. Normal diastolic function.c Normally functioning mechanical aortic valve. Mild MR. Normal RV size and systolic function.   Assessment / Plan: 1. Status post mechanical aortic valve replacement for severe aortic insufficiency. Good click on exam. Echo in April 2016 shoed good valve function. Continue anticoagulation and routine SBE prophylaxis.  Will update Echo now.  2.  Hepatitis B.  3. Graves disease s/p radioactive thyroid ablation. TSH normal.   Follow up in one year.

## 2021-01-16 ENCOUNTER — Other Ambulatory Visit: Payer: Self-pay

## 2021-01-16 ENCOUNTER — Ambulatory Visit (INDEPENDENT_AMBULATORY_CARE_PROVIDER_SITE_OTHER): Payer: 59 | Admitting: Cardiology

## 2021-01-16 ENCOUNTER — Encounter: Payer: Self-pay | Admitting: Cardiology

## 2021-01-16 VITALS — BP 120/80 | HR 65 | Ht 64.0 in | Wt 134.4 lb

## 2021-01-16 DIAGNOSIS — Z952 Presence of prosthetic heart valve: Secondary | ICD-10-CM | POA: Diagnosis not present

## 2021-01-16 DIAGNOSIS — I351 Nonrheumatic aortic (valve) insufficiency: Secondary | ICD-10-CM

## 2021-01-16 DIAGNOSIS — Z7901 Long term (current) use of anticoagulants: Secondary | ICD-10-CM | POA: Diagnosis not present

## 2021-01-16 MED ORDER — WARFARIN SODIUM 5 MG PO TABS: ORAL_TABLET | ORAL | 3 refills | Status: DC

## 2021-01-16 NOTE — Patient Instructions (Signed)
Medication Instructions:  Continue same medications *If you need a refill on your cardiac medications before your next appointment, please call your pharmacy*   Lab Work: None ordered   Testing/Procedures: Echo   Follow-Up: At [facility], you and your health needs are our priority.  As part of our continuing mission to provide you with exceptional heart care, we have created designated Provider Care Teams.  These Care Teams include your primary Cardiologist (physician) and Advanced Practice Providers (APPs -  Physician Assistants and Nurse Practitioners) who all work together to provide you with the care you need, when you need it.  We recommend signing up for the patient portal called "MyChart".  Sign up information is provided on this After Visit Summary.  MyChart is used to connect with patients for Virtual Visits (Telemedicine).  Patients are able to view lab/test results, encounter notes, upcoming appointments, etc.  Non-urgent messages can be sent to your provider as well.   To learn more about what you can do with MyChart, go to [email or web address].    Your next appointment:  1 year   Call in Feb to schedule May appointment   The format for your next appointment: Office   Provider:  Dr.Jordan

## 2021-02-13 ENCOUNTER — Other Ambulatory Visit: Payer: Self-pay

## 2021-02-13 ENCOUNTER — Ambulatory Visit (HOSPITAL_COMMUNITY): Payer: 59 | Attending: Internal Medicine

## 2021-02-13 DIAGNOSIS — Z7901 Long term (current) use of anticoagulants: Secondary | ICD-10-CM | POA: Diagnosis present

## 2021-02-13 DIAGNOSIS — Z952 Presence of prosthetic heart valve: Secondary | ICD-10-CM | POA: Diagnosis present

## 2021-02-13 DIAGNOSIS — I351 Nonrheumatic aortic (valve) insufficiency: Secondary | ICD-10-CM | POA: Diagnosis present

## 2021-02-13 LAB — ECHOCARDIOGRAM COMPLETE
AR max vel: 1.31 cm2
AV Area VTI: 1.33 cm2
AV Area mean vel: 1.35 cm2
AV Mean grad: 10 mmHg
AV Peak grad: 19.4 mmHg
Ao pk vel: 2.2 m/s
Area-P 1/2: 6.48 cm2
S' Lateral: 2.7 cm

## 2021-03-06 ENCOUNTER — Ambulatory Visit (INDEPENDENT_AMBULATORY_CARE_PROVIDER_SITE_OTHER): Payer: 59

## 2021-03-06 ENCOUNTER — Other Ambulatory Visit: Payer: Self-pay

## 2021-03-06 ENCOUNTER — Telehealth: Payer: Self-pay

## 2021-03-06 DIAGNOSIS — I359 Nonrheumatic aortic valve disorder, unspecified: Secondary | ICD-10-CM

## 2021-03-06 DIAGNOSIS — Z5181 Encounter for therapeutic drug level monitoring: Secondary | ICD-10-CM | POA: Diagnosis not present

## 2021-03-06 DIAGNOSIS — Z952 Presence of prosthetic heart valve: Secondary | ICD-10-CM

## 2021-03-06 DIAGNOSIS — Z7901 Long term (current) use of anticoagulants: Secondary | ICD-10-CM

## 2021-03-06 LAB — POCT INR: INR: 3.2 — AB (ref 2.0–3.0)

## 2021-03-06 MED ORDER — AMOXICILLIN 500 MG PO TABS: ORAL_TABLET | ORAL | 1 refills | Status: DC

## 2021-03-06 NOTE — Patient Instructions (Signed)
Continue taking 1 tablet daily. Eat greens tonight. Repeat INR in 6 weeks

## 2021-03-06 NOTE — Telephone Encounter (Signed)
Patient walked in office stated she will be having teeth cleaned and needs amoxicillin prescription.Prescription sent to pharmacy.

## 2021-04-04 ENCOUNTER — Other Ambulatory Visit: Payer: Self-pay | Admitting: Cardiology

## 2021-04-04 NOTE — Telephone Encounter (Signed)
Prescription refill request received for warfarin Lov: 01/16/21 [place]  Next INR check: 04/17/21 Warfarin tablet strength:'5mg'$ 

## 2021-04-17 ENCOUNTER — Ambulatory Visit (INDEPENDENT_AMBULATORY_CARE_PROVIDER_SITE_OTHER): Payer: 59

## 2021-04-17 ENCOUNTER — Other Ambulatory Visit: Payer: Self-pay

## 2021-04-17 DIAGNOSIS — Z952 Presence of prosthetic heart valve: Secondary | ICD-10-CM

## 2021-04-17 DIAGNOSIS — Z7901 Long term (current) use of anticoagulants: Secondary | ICD-10-CM | POA: Diagnosis not present

## 2021-04-17 DIAGNOSIS — I359 Nonrheumatic aortic valve disorder, unspecified: Secondary | ICD-10-CM

## 2021-04-17 DIAGNOSIS — Z5181 Encounter for therapeutic drug level monitoring: Secondary | ICD-10-CM | POA: Diagnosis not present

## 2021-04-17 LAB — POCT INR: INR: 3 (ref 2.0–3.0)

## 2021-04-17 NOTE — Patient Instructions (Signed)
Continue taking 1 tablet daily. Repeat INR in 6 weeks

## 2021-05-29 ENCOUNTER — Ambulatory Visit (INDEPENDENT_AMBULATORY_CARE_PROVIDER_SITE_OTHER): Payer: 59

## 2021-05-29 ENCOUNTER — Other Ambulatory Visit: Payer: Self-pay

## 2021-05-29 DIAGNOSIS — Z5181 Encounter for therapeutic drug level monitoring: Secondary | ICD-10-CM

## 2021-05-29 DIAGNOSIS — Z952 Presence of prosthetic heart valve: Secondary | ICD-10-CM

## 2021-05-29 DIAGNOSIS — I359 Nonrheumatic aortic valve disorder, unspecified: Secondary | ICD-10-CM | POA: Diagnosis not present

## 2021-05-29 DIAGNOSIS — Z7901 Long term (current) use of anticoagulants: Secondary | ICD-10-CM

## 2021-05-29 LAB — POCT INR: INR: 2.9 (ref 2.0–3.0)

## 2021-05-29 NOTE — Patient Instructions (Signed)
Continue taking 1 tablet daily. Repeat INR in 5 weeks; [phone]

## 2021-05-30 ENCOUNTER — Other Ambulatory Visit: Payer: Self-pay | Admitting: Cardiology

## 2021-05-30 NOTE — Telephone Encounter (Signed)
Prescription refill request received for warfarin Lov: [name], 01/16/2021 Next INR check: 11/2 Warfarin tablet strength: 5mg 

## 2021-06-09 NOTE — Patient Instructions (Addendum)
I will be in touch with your labs- please see me in 6 months assuming all is well!  Contact me a few weeks before you travel to [place] and I can give you treatment to prevent malaria and typhoid.  You might also want to get a Japanese Encephalitis vaccine through the Health Dept travel clinic-

## 2021-06-09 NOTE — Progress Notes (Addendum)
[facility] [address], [place], [place] [address] [phone]  Date:  06/12/2021   Name:  [name]   DOB:  1970/12/24   MRN:  [id number]  PCP:  Angela Mclean, MD    Chief Complaint: Follow-up (Concerns/ questions: none/Flu shot today: had this done last month/)   History of Present Illness:  [name] is a 50 y.o. very pleasant female patient who presents with the following:  Here today for thyroid follow-up Last seen by myself in March   History of post radiation hypothyroidism, hepatitis B, aortic valve disorder due to rheumatic fever with valve replacement on Coumadin, vitamin D deficiency Last seen by myself in January of this year Her anticoagulation is managed by Coumadin clinic Hematologist for iron deficiency anemia is [name] with [facility] Her [facility]  Flu vaccine done and covid booster done Pap coming due- this is done per GYN  Update all labs today   She is feeling well, no particular concerns.  No bleeding or other side effects from Coumadin.  She walks for exercise about 45 minutes a day  To 4 children or grown and working in [place], her youngest is a Ship broker at [employer]  Hepatology follow-up in July: Patient had been on entecavir for many years. She had a change in her health insurance and entecavir was no longer preferred formulary and was costing her $50 per month which was cost prohibitive. She was transitioned to [facility] in March 2021 which was the preferred hepatitis B drug by her insurance company. Most recent laboratory testing in February 2022 with ALT 12 and undetectable HBV DNA, and positive hepatitis B surface antigen   She is thinking of a traveling to [place] in January to visit family.  She estimates she would be there for at least a month.  I recommended that she consider a Japanese encephalitis vaccination through  the health department. If she does decide for certain she is traveling, we can also provide her with typhoid oral vaccine and malaria prophylaxis Patient Active Problem List   Diagnosis Date Noted   Hypothyroidism, postradioiodine therapy 02/04/2017   Vasovagal syncope 05/02/2015   Hepatitis B 04/28/2012   Aortic valve disorder 02/11/2012   Long term (current) use of anticoagulants 02/11/2012   S/P AVR 12/16/2011   Chronic anticoagulation 12/16/2011   PULMONARY NODULE 01/31/2010   DYSPNEA 01/31/2010    Past Medical History:  Diagnosis Date   Femur fracture, left ([place]) 1990   with open reduction and internal fixation    H/O: rheumatic fever    Hepatitis B carrier ([place])    positive hepatitis B carrier; maintained on Baraclude; followed by Hepatitis clinic.   History of allergic rhinitis    Severe aortic insufficiency 09/01/2004   s/p aortic valve replacement.      Past Surgical History:  Procedure Laterality Date   AORTIC VALVE REPLACEMENT  4/06   83mm On-X mechanical valve   CARDIAC CATHETERIZATION  11/15/2004   Ejection fraction is estimated 55%   HEMORRHOID SURGERY      Social History   Tobacco Use   Smoking status: Never   Smokeless tobacco: Never  Vaping Use   Vaping Use: Never used  Substance Use Topics   Alcohol use: No    Alcohol/week: 0.0 standard drinks   Drug use: No    Family History  Problem Relation  Age of Onset   Heart disease Brother        valvular dysfunction; s/p replacement   Hypertension Mother    Hyperlipidemia Mother    Hypothyroidism Mother    Heart disease Father    Hypertension Father    Hyperlipidemia Father    Stomach cancer Maternal Grandmother    Colon cancer Neg Hx    Rectal cancer Neg Hx    Colon polyps Neg Hx     No Known Allergies  Medication list has been reviewed and updated.  Current Outpatient Medications on File Prior to Visit  Medication Sig Dispense Refill   levothyroxine (SYNTHROID) 75 MCG tablet Take 1  tablet (75 mcg total) by mouth daily before breakfast. 90 tablet 1   warfarin (COUMADIN) 5 MG tablet Take 1 tablet by mouth daily as directed by the coumadin clinic 40 tablet 0   No current facility-administered medications on file prior to visit.    Review of Systems:  As per HPI- otherwise negative.   Physical Examination: Vitals:   06/12/21 1051  BP: 112/70  Pulse: 71  Resp: 18  Temp: 98 F (36.7 C)  SpO2: 98%   Vitals:   06/12/21 1051  Weight: 135 lb 3.2 oz (61.3 kg)  Height: 5\' 4"  (1.626 m)   Body mass index is 23.21 kg/m. Ideal Body Weight: Weight in (lb) to have BMI = 25: 145.3  GEN: no acute distress. Looks well, slim build HEENT: Atraumatic, Normocephalic.  Ears and Nose: No external deformity. CV: RRR- mechanical valve in place, No M/G/R. No JVD. No thrill. No extra heart sounds. PULM: CTA B, no wheezes, crackles, rhonchi. No retractions. No resp. distress. No accessory muscle use. ABD: S, NT, ND, +BS. No rebound. No HSM. EXTR: No c/c/e PSYCH: Normally interactive. Conversant.    Assessment and Plan: Vitamin D deficiency - Plan: VITAMIN D 25 Hydroxy (Vit-D Deficiency, Fractures)  Acquired hypothyroidism - Plan: TSH  Chronic anticoagulation - Plan: CBC  Screening for diabetes mellitus - Plan: Comprehensive metabolic panel, Hemoglobin A1c  Screening for hyperlipidemia - Plan: Lipid panel  Travel advice encounter  Following up on labs today- Will plan further follow- up pending results Discussed her travel plans-if she does decide she is definitely going she will let me know, and we can prescribe typhoid and malaria prophylaxis Recommend Japanese encephalitis vaccination to the health department   Signed Angela Blinks, MD  Received her labs as below- letter to patient   Results for orders placed or performed in visit on 06/12/21  CBC  Result Value Ref Range   WBC 4.7 4.0 - 10.5 K/uL   RBC 4.16 3.87 - 5.11 Mil/uL   Platelets 308.0 150.0 -  400.0 K/uL   Hemoglobin 11.6 (L) 12.0 - 15.0 g/dL   HCT 35.8 (L) 36.0 - 46.0 %   MCV 86.0 78.0 - 100.0 fl   MCHC 32.4 30.0 - 36.0 g/dL   RDW 14.3 11.5 - 15.5 %  Comprehensive metabolic panel  Result Value Ref Range   Sodium 139 135 - 145 mEq/L   Potassium 4.1 3.5 - 5.1 mEq/L   Chloride 104 96 - 112 mEq/L   CO2 29 19 - 32 mEq/L   Glucose, Bld 91 70 - 99 mg/dL   BUN 19 6 - 23 mg/dL   Creatinine, Ser 0.61 0.40 - 1.20 mg/dL   Total Bilirubin 0.6 0.2 - 1.2 mg/dL   Alkaline Phosphatase 44 39 - 117 U/L   AST 19 0 - 37  U/L   ALT 13 0 - 35 U/L   Total Protein 7.7 6.0 - 8.3 g/dL   Albumin 4.4 3.5 - 5.2 g/dL   GFR 105.58 >60.00 mL/min   Calcium 9.3 8.4 - 10.5 mg/dL  Hemoglobin A1c  Result Value Ref Range   Hgb A1c MFr Bld 5.3 4.6 - 6.5 %  Lipid panel  Result Value Ref Range   Cholesterol 213 (H) 0 - 200 mg/dL   Triglycerides 78.0 0.0 - 149.0 mg/dL   HDL 63.60 >39.00 mg/dL   VLDL 15.6 0.0 - 40.0 mg/dL   LDL Cholesterol 133 (H) 0 - 99 mg/dL   Total CHOL/HDL Ratio 3    NonHDL 148.90   TSH  Result Value Ref Range   TSH 2.00 0.35 - 5.50 uIU/mL  VITAMIN D 25 Hydroxy (Vit-D Deficiency, Fractures)  Result Value Ref Range   VITD 28.72 (L) 30.00 - 100.00 ng/mL    The 10-year ASCVD risk score (Arnett DK, et al., 2019) is: 0.7%   Values used to calculate the score:     Age: 6 years     Sex: Female     Is Non-Hispanic African American: No     Diabetic: No     Tobacco smoker: No     Systolic Blood Pressure: 196 mmHg     Is BP treated: No     HDL Cholesterol: 63.6 mg/dL     Total Cholesterol: 213 mg/dL

## 2021-06-12 ENCOUNTER — Ambulatory Visit (INDEPENDENT_AMBULATORY_CARE_PROVIDER_SITE_OTHER): Payer: 59 | Admitting: Family Medicine

## 2021-06-12 ENCOUNTER — Other Ambulatory Visit: Payer: Self-pay

## 2021-06-12 VITALS — BP 112/70 | HR 71 | Temp 98.0°F | Resp 18 | Ht 64.0 in | Wt 135.2 lb

## 2021-06-12 DIAGNOSIS — Z7184 Encounter for health counseling related to travel: Secondary | ICD-10-CM

## 2021-06-12 DIAGNOSIS — Z13220 Encounter for screening for lipoid disorders: Secondary | ICD-10-CM

## 2021-06-12 DIAGNOSIS — Z131 Encounter for screening for diabetes mellitus: Secondary | ICD-10-CM | POA: Diagnosis not present

## 2021-06-12 DIAGNOSIS — E559 Vitamin D deficiency, unspecified: Secondary | ICD-10-CM | POA: Diagnosis not present

## 2021-06-12 DIAGNOSIS — Z7901 Long term (current) use of anticoagulants: Secondary | ICD-10-CM

## 2021-06-12 DIAGNOSIS — E039 Hypothyroidism, unspecified: Secondary | ICD-10-CM | POA: Diagnosis not present

## 2021-06-12 LAB — CBC
HCT: 35.8 % — ABNORMAL LOW (ref 36.0–46.0)
Hemoglobin: 11.6 g/dL — ABNORMAL LOW (ref 12.0–15.0)
MCHC: 32.4 g/dL (ref 30.0–36.0)
MCV: 86 fl (ref 78.0–100.0)
Platelets: 308 10*3/uL (ref 150.0–400.0)
RBC: 4.16 Mil/uL (ref 3.87–5.11)
RDW: 14.3 % (ref 11.5–15.5)
WBC: 4.7 10*3/uL (ref 4.0–10.5)

## 2021-06-12 LAB — COMPREHENSIVE METABOLIC PANEL
ALT: 13 U/L (ref 0–35)
AST: 19 U/L (ref 0–37)
Albumin: 4.4 g/dL (ref 3.5–5.2)
Alkaline Phosphatase: 44 U/L (ref 39–117)
BUN: 19 mg/dL (ref 6–23)
CO2: 29 mEq/L (ref 19–32)
Calcium: 9.3 mg/dL (ref 8.4–10.5)
Chloride: 104 mEq/L (ref 96–112)
Creatinine, Ser: 0.61 mg/dL (ref 0.40–1.20)
GFR: 105.58 mL/min (ref >60.00)
Glucose, Bld: 91 mg/dL (ref 70–99)
Potassium: 4.1 mEq/L (ref 3.5–5.1)
Sodium: 139 mEq/L (ref 135–145)
Total Bilirubin: 0.6 mg/dL (ref 0.2–1.2)
Total Protein: 7.7 g/dL (ref 6.0–8.3)

## 2021-06-12 LAB — LIPID PANEL
Cholesterol: 213 mg/dL — ABNORMAL HIGH (ref 0–200)
HDL: 63.6 mg/dL (ref >39.00)
LDL Cholesterol: 133 mg/dL — ABNORMAL HIGH (ref 0–99)
NonHDL: 148.9
Total CHOL/HDL Ratio: 3
Triglycerides: 78 mg/dL (ref 0.0–149.0)
VLDL: 15.6 mg/dL (ref 0.0–40.0)

## 2021-06-12 LAB — HEMOGLOBIN A1C: Hgb A1c MFr Bld: 5.3 % (ref 4.6–6.5)

## 2021-06-12 LAB — TSH: TSH: 2 u[IU]/mL (ref 0.35–5.50)

## 2021-06-12 LAB — VITAMIN D 25 HYDROXY (VIT D DEFICIENCY, FRACTURES): VITD: 28.72 ng/mL — ABNORMAL LOW (ref 30.00–100.00)

## 2021-07-01 ENCOUNTER — Other Ambulatory Visit: Payer: Self-pay

## 2021-07-01 ENCOUNTER — Ambulatory Visit (INDEPENDENT_AMBULATORY_CARE_PROVIDER_SITE_OTHER): Payer: 59

## 2021-07-01 DIAGNOSIS — Z5181 Encounter for therapeutic drug level monitoring: Secondary | ICD-10-CM

## 2021-07-01 DIAGNOSIS — Z7901 Long term (current) use of anticoagulants: Secondary | ICD-10-CM | POA: Diagnosis not present

## 2021-07-01 DIAGNOSIS — Z952 Presence of prosthetic heart valve: Secondary | ICD-10-CM

## 2021-07-01 DIAGNOSIS — I359 Nonrheumatic aortic valve disorder, unspecified: Secondary | ICD-10-CM | POA: Diagnosis not present

## 2021-07-01 LAB — POCT INR: INR: 3.2 — AB (ref 2.0–3.0)

## 2021-07-01 MED ORDER — WARFARIN SODIUM 5 MG PO TABS: ORAL_TABLET | ORAL | 1 refills | Status: DC

## 2021-07-01 NOTE — Patient Instructions (Signed)
Continue taking 1 tablet daily. Repeat INR in 8 weeks; [phone];  Eat greens tonight

## 2021-07-09 ENCOUNTER — Telehealth: Payer: Self-pay | Admitting: Family Medicine

## 2021-07-09 DIAGNOSIS — Z7184 Encounter for health counseling related to travel: Secondary | ICD-10-CM

## 2021-07-09 NOTE — Telephone Encounter (Signed)
Pt. Called in and stated that when she was seen 10/12 she had mentioned that she was leaving the country and needed vaccines before she went. She stated Dr Lorelei Pont stated she can come in and receive those. She wanted to make sure that was correct and if so when should she come in.

## 2021-07-10 MED ORDER — TYPHOID VACCINE PO CPDR: 1.0000 | DELAYED_RELEASE_CAPSULE | ORAL | 0 refills | Status: DC

## 2021-07-10 MED ORDER — BENZONATATE 100 MG PO CAPS: 100.0000 mg | ORAL_CAPSULE | Freq: Two times a day (BID) | ORAL | 0 refills | Status: DC | PRN

## 2021-07-10 MED ORDER — ATOVAQUONE-PROGUANIL HCL 250-100 MG PO TABS: 1.0000 | ORAL_TABLET | Freq: Every day | ORAL | 0 refills | Status: DC

## 2021-07-10 NOTE — Telephone Encounter (Signed)
Called pt and let her know- I called in medications as below (she also requests something to have on hand for cough)  Meds ordered this encounter  Medications   typhoid (VIVOTIF) DR capsule    Sig: Take 1 capsule by mouth every other day.    Dispense:  4 capsule    Refill:  0   atovaquone-proguanil (MALARONE) 250-100 MG TABS tablet    Sig: Take 1 tablet by mouth daily. Start 1-2 days prior to arrival, continue for 7 days once home    Dispense:  60 tablet    Refill:  0   benzonatate (TESSALON) 100 MG capsule    Sig: Take 1 capsule (100 mg total) by mouth 2 (two) times daily as needed for cough.    Dispense:  60 capsule    Refill:  0   She denies any current pregnancy or future plans for pregnancy

## 2021-07-16 ENCOUNTER — Other Ambulatory Visit: Payer: Self-pay | Admitting: Family Medicine

## 2021-07-16 DIAGNOSIS — E039 Hypothyroidism, unspecified: Secondary | ICD-10-CM

## 2021-08-27 ENCOUNTER — Telehealth: Payer: Self-pay

## 2021-08-27 NOTE — Telephone Encounter (Signed)
Called and lmom pt that they missed appt

## 2021-08-28 ENCOUNTER — Ambulatory Visit (INDEPENDENT_AMBULATORY_CARE_PROVIDER_SITE_OTHER): Payer: 59

## 2021-08-28 ENCOUNTER — Other Ambulatory Visit: Payer: Self-pay

## 2021-08-28 DIAGNOSIS — I359 Nonrheumatic aortic valve disorder, unspecified: Secondary | ICD-10-CM

## 2021-08-28 DIAGNOSIS — Z952 Presence of prosthetic heart valve: Secondary | ICD-10-CM

## 2021-08-28 DIAGNOSIS — Z7901 Long term (current) use of anticoagulants: Secondary | ICD-10-CM | POA: Diagnosis not present

## 2021-08-28 LAB — POCT INR: INR: 3.1 — AB (ref 2.0–3.0)

## 2021-08-28 NOTE — Patient Instructions (Signed)
Description   Continue taking 1 tablet daily. Repeat INR in 3 weeks;  Coumadin Coumadin [phone]   Pt going out of country 09/23/21.

## 2021-09-09 ENCOUNTER — Other Ambulatory Visit: Payer: Self-pay | Admitting: Cardiology

## 2021-09-10 DIAGNOSIS — Z1231 Encounter for screening mammogram for malignant neoplasm of breast: Secondary | ICD-10-CM | POA: Diagnosis not present

## 2021-09-10 DIAGNOSIS — Z113 Encounter for screening for infections with a predominantly sexual mode of transmission: Secondary | ICD-10-CM | POA: Diagnosis not present

## 2021-09-10 DIAGNOSIS — Z01419 Encounter for gynecological examination (general) (routine) without abnormal findings: Secondary | ICD-10-CM | POA: Diagnosis not present

## 2021-09-10 DIAGNOSIS — R69 Illness, unspecified: Secondary | ICD-10-CM | POA: Diagnosis not present

## 2021-09-10 LAB — HM MAMMOGRAPHY

## 2021-09-11 ENCOUNTER — Other Ambulatory Visit: Payer: Self-pay

## 2021-09-11 ENCOUNTER — Telehealth: Payer: Self-pay

## 2021-09-11 MED ORDER — WARFARIN SODIUM 5 MG PO TABS: ORAL_TABLET | ORAL | 1 refills | Status: DC

## 2021-09-11 NOTE — Telephone Encounter (Signed)
Pt calling to ask about holding warfarin to have surgery in her home country for warfarin she is unsure how that works. I advised her she needed to get it scheduled then give [place] a call she she seems to really need to speak w/pharmd because she is saying she cannot schedule the surgery until she is seen by them and will need instructions on how to hold prior to traveling there. Will route to pharmd pool.

## 2021-09-17 NOTE — Telephone Encounter (Signed)
Pt called and stated that they were having eyelid surgery. They stated that they are leaving on 09/23/21 for [place]. Pt is coming in for appt on 1/18 and requested we just talk to her then. I will route to kristin alvstad to make her aware

## 2021-09-17 NOTE — Telephone Encounter (Signed)
Lmom pt to call me back to let me know when she is goig to [place] and what surgery she is having. Will await pt callback

## 2021-09-17 NOTE — Telephone Encounter (Signed)
[name] - please call pt and find out what kind of surgery she is planning to have, and when is she going to [place]?

## 2021-09-17 NOTE — Telephone Encounter (Signed)
Patient with On-X mechanical AVR, planning to have eyelid surgery sometime in the near future while in [place].  She has no record of AF or other risk factors.   She is cleared to hold warfarin for 5 days prior to procedure and should re-start day after or as soon as recommended by surgeon.  We will supply patient with a letter to take to her physician in [place].

## 2021-09-18 ENCOUNTER — Ambulatory Visit (INDEPENDENT_AMBULATORY_CARE_PROVIDER_SITE_OTHER): Payer: 59

## 2021-09-18 ENCOUNTER — Other Ambulatory Visit: Payer: Self-pay

## 2021-09-18 DIAGNOSIS — Z7901 Long term (current) use of anticoagulants: Secondary | ICD-10-CM

## 2021-09-18 DIAGNOSIS — Z952 Presence of prosthetic heart valve: Secondary | ICD-10-CM | POA: Diagnosis not present

## 2021-09-18 DIAGNOSIS — I359 Nonrheumatic aortic valve disorder, unspecified: Secondary | ICD-10-CM

## 2021-09-18 DIAGNOSIS — Z5181 Encounter for therapeutic drug level monitoring: Secondary | ICD-10-CM | POA: Diagnosis not present

## 2021-09-18 LAB — POCT INR: INR: 2.4 (ref 2.0–3.0)

## 2021-09-18 NOTE — Patient Instructions (Signed)
HOLD TODAY AND THEN DECREASE TO  1 tablet daily except 0.5 tablet Monday and Friday; Repeat INR in 8 weeks; HOLD COUMADIN 5 DAYS PRIOR TO PROCEDURE. Coumadin Coumadin #[phone]   Pt going out of country 09/23/21- 11/08/21

## 2021-11-13 ENCOUNTER — Other Ambulatory Visit: Payer: Self-pay

## 2021-11-13 ENCOUNTER — Ambulatory Visit (INDEPENDENT_AMBULATORY_CARE_PROVIDER_SITE_OTHER): Payer: 59

## 2021-11-13 DIAGNOSIS — Z5181 Encounter for therapeutic drug level monitoring: Secondary | ICD-10-CM

## 2021-11-13 DIAGNOSIS — Z7901 Long term (current) use of anticoagulants: Secondary | ICD-10-CM | POA: Diagnosis not present

## 2021-11-13 DIAGNOSIS — Z952 Presence of prosthetic heart valve: Secondary | ICD-10-CM

## 2021-11-13 DIAGNOSIS — I359 Nonrheumatic aortic valve disorder, unspecified: Secondary | ICD-10-CM

## 2021-11-13 LAB — POCT INR: INR: 1.9 — AB (ref 2.0–3.0)

## 2021-11-13 NOTE — Patient Instructions (Signed)
Continue 1 tablet daily except 0.5 tablet Monday and Friday; Repeat INR in 6 weeks; ?Coumadin Coumadin [phone]   ? ?

## 2021-12-18 DIAGNOSIS — R69 Illness, unspecified: Secondary | ICD-10-CM | POA: Diagnosis not present

## 2021-12-18 DIAGNOSIS — K760 Fatty (change of) liver, not elsewhere classified: Secondary | ICD-10-CM | POA: Diagnosis not present

## 2021-12-21 NOTE — Patient Instructions (Addendum)
It was good to see you again today, please see me in about 6 months ?I am bit concerned you may have shingles around your eye- this can be dangerous to your vision ? ?Please arrive for your eye appt today at 2:00 ?[facility] ?Located in: [facility] ?Address: [address], [place], [place] [address] ?Phone: [phone] ? ? ?We will also start you on valtrex for possible shingles- take three times daily for one week ? ?Can add triamincolone cream to your hand moisturizing regimen once a day  ? ? ?

## 2021-12-21 NOTE — Progress Notes (Addendum)
Therapist, music at [facility] ?[address], [address] ?[place], [place] [address] ?336 [phone] ?Fax [phone] ? ?Date:  12/25/2021  ? ?Name:  [name]   DOB:  02-Jul-1971   MRN:  [id number] ? ?PCP:  Darreld Mclean, MD  ? ? ?Chief Complaint: 6 month follow up (Concerns/ questions: none/) ? ? ?History of Present Illness: ? ?[name] is a 51 y.o. very pleasant female patient who presents with the following: ? ?Patient seen today for periodic follow-up ?Most recent visit with myself was in October ?History of post radiation hypothyroidism, hepatitis B, aortic valve disorder due to rheumatic fever with valve replacement on Coumadin, vitamin D deficiency ?Her anticoagulation is managed by Coumadin clinic ?Hematologist for iron deficiency anemia is Dr. Tamala Julian with [facility] ?[facility] ? ?Most recent visit with hepatology earlier this month: ?Patient had been on entecavir for many years. She had a change in her health insurance and entecavir was no longer preferred formulary and was costing her $50 per month which was cost prohibitive. She was transitioned to [facility] in March 2021 which was the preferred hepatitis B drug by her insurance company. Most recent laboratory testing in September 2022 with ALT 13 and undetectable HBV DNA. She had a positive hepatitis B surface antigen in February 2022. ?Patient had FibroScan today with no evidence of advanced fibrosis. ?Fibroscan does show mild hepatic steatosis. ?-They plan for annual liver ultrasound and every 6 month follow-up ? ?She is seen regularly at Coumadin clinic ?Most recent cardiology appointment in May of last year, Dr. [place] ? ?Lab Results  ?Component Value Date  ? TSH 2.00 06/12/2021  ? ?Can update TSH today ?Most recent labs at my office in October-minimal anemia at that time ?We will also update her CBC and ferritin levels today ? ?Pt notes she felt some pain around her left eye and left  temple over the weekend, and now notes a sore spot on the occiput of her head. She also noted some skin outbreaks on her left lid yesterday  ?She has not noted any particular vision change ? ?She actually asked about shingles vaccine today.  Advised she does not meet age requirements for most insurance companies, but I am concerned she may actually have shingles now ?Patient Active Problem List  ? Diagnosis Date Noted  ? Hypothyroidism, postradioiodine therapy 02/04/2017  ? Vasovagal syncope 05/02/2015  ? Hepatitis B 04/28/2012  ? Aortic valve disorder 02/11/2012  ? Long term (current) use of anticoagulants 02/11/2012  ? S/P AVR 12/16/2011  ? Chronic anticoagulation 12/16/2011  ? PULMONARY NODULE 01/31/2010  ? DYSPNEA 01/31/2010  ? ? ?Past Medical History:  ?Diagnosis Date  ? Femur fracture, left ([place]) 1990  ? with open reduction and internal fixation   ? H/O: rheumatic fever   ? Hepatitis B carrier ([place])   ? positive hepatitis B carrier; maintained on Baraclude; followed by Hepatitis clinic.  ? History of allergic rhinitis   ? Severe aortic insufficiency 09/01/2004  ? s/p aortic valve replacement.    ? ? ?Past Surgical History:  ?Procedure Laterality Date  ? AORTIC VALVE REPLACEMENT  4/06  ? 67m On-X mechanical valve  ? CARDIAC CATHETERIZATION  11/15/2004  ? Ejection fraction is estimated 55%  ? HEMORRHOID SURGERY    ? ? ?Social History  ? ?Tobacco Use  ? Smoking status: Never  ? Smokeless tobacco: Never  ?Vaping Use  ?  Vaping Use: Never used  ?Substance Use Topics  ? Alcohol use: No  ?  Alcohol/week: 0.0 standard drinks  ? Drug use: No  ? ? ?Family History  ?Problem Relation Age of Onset  ? Heart disease Brother   ?     valvular dysfunction; s/p replacement  ? Hypertension Mother   ? Hyperlipidemia Mother   ? Hypothyroidism Mother   ? Heart disease Father   ? Hypertension Father   ? Hyperlipidemia Father   ? Stomach cancer Maternal Grandmother   ? Colon cancer Neg Hx   ? Rectal cancer Neg Hx   ? Colon polyps Neg Hx    ? ? ?No Known Allergies ? ?Medication list has been reviewed and updated. ? ?Current Outpatient Medications on File Prior to Visit  ?Medication Sig Dispense Refill  ? levothyroxine (SYNTHROID) 75 MCG tablet Take 75 mcg by mouth daily before breakfast.    ? warfarin (COUMADIN) 5 MG tablet TAKE 1 TABLET BY MOUTH DAILY AS DIRECTED BY COUMADIN CLINIC 60 tablet 1  ? ?No current facility-administered medications on file prior to visit.  ? ? ?Review of Systems: ? ?As per HPI- otherwise negative. ? ? ?Physical Examination: ?Vitals:  ? 12/25/21 1051  ?BP: 120/80  ?Pulse: 78  ?Resp: 18  ?Temp: 98.3 ?F (36.8 ?C)  ?SpO2: 99%  ? ?Vitals:  ? 12/25/21 1051  ?Weight: 133 lb 9.6 oz (60.6 kg)  ?Height: '5\' 4"'$  (1.626 m)  ? ?Body mass index is 22.93 kg/m?. ?Ideal Body Weight: Weight in (lb) to have BMI = 25: 145.3 ? ?GEN: no acute distress.  Normal weight, looks well ?HEENT: Atraumatic, Normocephalic.  Left eye is mildly injected.  There is a vesicular lesion on the right medial eyelid.  I am not able to appreciate any significant lymphadenopathy otherwise in her head or neck ?Ears and Nose: No external deformity. ?CV: RRR, No M/G/R. No JVD. No thrill. No extra heart sounds. ?PULM: CTA B, no wheezes, crackles, rhonchi. No retractions. No resp. distress. No accessory muscle use. ?EXTR: No c/c/e ?PSYCH: Normally interactive. Conversant.  ?She notes dry skin which may crack on her palms and around her fingers ? ?Assessment and Plan: ?Chronic anticoagulation ? ?Acquired hypothyroidism - Plan: TSH ? ?Iron deficiency anemia, unspecified iron deficiency anemia type - Plan: CBC, Ferritin ? ?Dyshidrotic eczema - Plan: triamcinolone ointment (KENALOG) 0.5 % ? ?Facial rash - Plan: Ambulatory referral to Ophthalmology, valACYclovir (VALTREX) 1000 MG tablet ? ?Patient seen today for follow-up.  We will assess her thyroid level, check CBC and ferritin ?She is using a good-quality moisturizer for her hands, we will have her add triamcinolone once a  day as needed for likely dyshidrotic eczema ?Explained to patient that I am concerned she may have shingles involving her left eye.  We will have her start on Valtrex today.  I was able to make her an appointment with [facility] this afternoon at 2 PM.  Patient states she will attend this appointment ? ?Will plan further follow- up pending labs. ? ? ?Signed ?Lamar Blinks, MD ? ?Received patient labs as below, letter to patient ?Results for orders placed or performed in visit on 12/25/21  ?TSH  ?Result Value Ref Range  ? TSH 0.43 0.35 - 5.50 uIU/mL  ?CBC  ?Result Value Ref Range  ? WBC 4.8 4.0 - 10.5 K/uL  ? RBC 4.89 3.87 - 5.11 Mil/uL  ? Platelets 271.0 150.0 - 400.0 K/uL  ? Hemoglobin 14.2 12.0 - 15.0 g/dL  ?  HCT 43.7 36.0 - 46.0 %  ? MCV 89.4 78.0 - 100.0 fl  ? MCHC 32.4 30.0 - 36.0 g/dL  ? RDW 13.9 11.5 - 15.5 %  ?Ferritin  ?Result Value Ref Range  ? Ferritin 22.0 10.0 - 291.0 ng/mL  ? ? ? ?

## 2021-12-23 ENCOUNTER — Telehealth: Payer: Self-pay

## 2021-12-23 NOTE — Telephone Encounter (Signed)
Called and lvm to inform them of their coumadin appt time and instructed them to call back should issues arise since they had just called requesting their appt time.  ?

## 2021-12-25 ENCOUNTER — Ambulatory Visit (INDEPENDENT_AMBULATORY_CARE_PROVIDER_SITE_OTHER): Payer: 59 | Admitting: Family Medicine

## 2021-12-25 VITALS — BP 120/80 | HR 78 | Temp 98.3°F | Resp 18 | Ht 64.0 in | Wt 133.6 lb

## 2021-12-25 DIAGNOSIS — L301 Dyshidrosis [pompholyx]: Secondary | ICD-10-CM

## 2021-12-25 DIAGNOSIS — R21 Rash and other nonspecific skin eruption: Secondary | ICD-10-CM

## 2021-12-25 DIAGNOSIS — E039 Hypothyroidism, unspecified: Secondary | ICD-10-CM | POA: Diagnosis not present

## 2021-12-25 DIAGNOSIS — Z9889 Other specified postprocedural states: Secondary | ICD-10-CM | POA: Diagnosis not present

## 2021-12-25 DIAGNOSIS — Z7901 Long term (current) use of anticoagulants: Secondary | ICD-10-CM | POA: Diagnosis not present

## 2021-12-25 DIAGNOSIS — H11041 Peripheral pterygium, stationary, right eye: Secondary | ICD-10-CM | POA: Diagnosis not present

## 2021-12-25 DIAGNOSIS — D509 Iron deficiency anemia, unspecified: Secondary | ICD-10-CM | POA: Diagnosis not present

## 2021-12-25 DIAGNOSIS — B0239 Other herpes zoster eye disease: Secondary | ICD-10-CM | POA: Diagnosis not present

## 2021-12-25 LAB — CBC
HCT: 43.7 % (ref 36.0–46.0)
Hemoglobin: 14.2 g/dL (ref 12.0–15.0)
MCHC: 32.4 g/dL (ref 30.0–36.0)
MCV: 89.4 fl (ref 78.0–100.0)
Platelets: 271 10*3/uL (ref 150.0–400.0)
RBC: 4.89 Mil/uL (ref 3.87–5.11)
RDW: 13.9 % (ref 11.5–15.5)
WBC: 4.8 10*3/uL (ref 4.0–10.5)

## 2021-12-25 LAB — TSH: TSH: 0.43 u[IU]/mL (ref 0.35–5.50)

## 2021-12-25 LAB — FERRITIN: Ferritin: 22 ng/mL (ref 10.0–291.0)

## 2021-12-25 MED ORDER — VALACYCLOVIR HCL 1 G PO TABS: 1000.0000 mg | ORAL_TABLET | Freq: Three times a day (TID) | ORAL | 0 refills | Status: DC

## 2021-12-25 MED ORDER — TRIAMCINOLONE ACETONIDE 0.5 % EX OINT
1.0000 | TOPICAL_OINTMENT | Freq: Every day | CUTANEOUS | 0 refills | Status: DC
Start: 2021-12-25 — End: 2022-06-20

## 2021-12-30 ENCOUNTER — Telehealth: Payer: Self-pay | Admitting: Family Medicine

## 2021-12-30 DIAGNOSIS — R21 Rash and other nonspecific skin eruption: Secondary | ICD-10-CM

## 2021-12-30 MED ORDER — VALACYCLOVIR HCL 1 G PO TABS: 1000.0000 mg | ORAL_TABLET | Freq: Three times a day (TID) | ORAL | 0 refills | Status: DC

## 2021-12-30 NOTE — Telephone Encounter (Signed)
Please advise 

## 2021-12-30 NOTE — Telephone Encounter (Signed)
Called her back- we can certainly extend her valtex.  Unfortunately not an effective cream for this ?Called in 5 days more of valtrex ?She has seen optho and her eye seems to be ok ?She states understanding  ?

## 2021-12-30 NOTE — Telephone Encounter (Signed)
Patient would like to get more shingles medication since she only has two days left and still doesn't feel better. She stated she would also like a cream for it since there's a lot on her side. Please advise.  ?

## 2022-01-09 ENCOUNTER — Ambulatory Visit: Payer: 59

## 2022-01-09 DIAGNOSIS — Z5181 Encounter for therapeutic drug level monitoring: Secondary | ICD-10-CM

## 2022-01-09 DIAGNOSIS — Z952 Presence of prosthetic heart valve: Secondary | ICD-10-CM | POA: Diagnosis not present

## 2022-01-09 DIAGNOSIS — Z7901 Long term (current) use of anticoagulants: Secondary | ICD-10-CM

## 2022-01-09 DIAGNOSIS — I359 Nonrheumatic aortic valve disorder, unspecified: Secondary | ICD-10-CM

## 2022-01-09 LAB — POCT INR: INR: 1.3 — AB (ref 2.0–3.0)

## 2022-01-09 NOTE — Patient Instructions (Signed)
TAKE 1.5 TABLETS TODAY ONLY and then Continue 1 tablet daily except 0.5 tablet Monday and Friday; Repeat INR in 4 weeks; ?Coumadin Coumadin [phone]   ? ?

## 2022-01-24 ENCOUNTER — Other Ambulatory Visit: Payer: Self-pay | Admitting: Nurse Practitioner

## 2022-01-24 DIAGNOSIS — B181 Chronic viral hepatitis B without delta-agent: Secondary | ICD-10-CM

## 2022-01-31 ENCOUNTER — Telehealth: Payer: Self-pay | Admitting: Family Medicine

## 2022-01-31 ENCOUNTER — Other Ambulatory Visit: Payer: Self-pay

## 2022-01-31 ENCOUNTER — Other Ambulatory Visit: Payer: Self-pay | Admitting: Family Medicine

## 2022-01-31 NOTE — Telephone Encounter (Signed)
Refill has been sent.  °

## 2022-01-31 NOTE — Telephone Encounter (Signed)
Medication: levothyroxine (SYNTHROID) 75 MCG tablet  Has the patient contacted their pharmacy? Yes.    Preferred Pharmacy (with phone number or street name):   [id number] [place] [address] Phone:  [phone] Fax:  [phone] Agent: Please be advised that RX refills may take up to 3 business days. We ask that you follow-up with your pharmacy.

## 2022-02-04 ENCOUNTER — Ambulatory Visit: Payer: 59

## 2022-02-04 DIAGNOSIS — Z7901 Long term (current) use of anticoagulants: Secondary | ICD-10-CM

## 2022-02-04 DIAGNOSIS — I359 Nonrheumatic aortic valve disorder, unspecified: Secondary | ICD-10-CM

## 2022-02-04 DIAGNOSIS — Z952 Presence of prosthetic heart valve: Secondary | ICD-10-CM

## 2022-02-04 DIAGNOSIS — Z5181 Encounter for therapeutic drug level monitoring: Secondary | ICD-10-CM | POA: Diagnosis not present

## 2022-02-04 LAB — POCT INR: INR: 1.9 — AB (ref 2.0–3.0)

## 2022-02-04 NOTE — Patient Instructions (Signed)
Continue 1 tablet daily except 0.5 tablet Monday and Friday; Repeat INR in 7 weeks; Coumadin Coumadin [phone]

## 2022-02-05 ENCOUNTER — Ambulatory Visit
Admission: RE | Admit: 2022-02-05 | Discharge: 2022-02-05 | Disposition: A | Discharge status: Home / Self Care | Payer: 59 | Source: Ambulatory Visit | Attending: Nurse Practitioner | Admitting: Nurse Practitioner

## 2022-02-05 DIAGNOSIS — R69 Illness, unspecified: Secondary | ICD-10-CM | POA: Diagnosis not present

## 2022-02-05 DIAGNOSIS — B181 Chronic viral hepatitis B without delta-agent: Secondary | ICD-10-CM

## 2022-03-26 ENCOUNTER — Ambulatory Visit: Payer: 59

## 2022-03-26 DIAGNOSIS — Z952 Presence of prosthetic heart valve: Secondary | ICD-10-CM | POA: Diagnosis not present

## 2022-03-26 DIAGNOSIS — I359 Nonrheumatic aortic valve disorder, unspecified: Secondary | ICD-10-CM

## 2022-03-26 DIAGNOSIS — Z7901 Long term (current) use of anticoagulants: Secondary | ICD-10-CM | POA: Diagnosis not present

## 2022-03-26 LAB — POCT INR: INR: 2.9 (ref 2.0–3.0)

## 2022-03-26 NOTE — Patient Instructions (Signed)
Description   Only take 0.5 tablet today and then continue 1 tablet daily except 0.5 tablet Monday and Friday Repeat INR in 4 weeks Coumadin Coumadin [phone]

## 2022-04-23 ENCOUNTER — Ambulatory Visit: Payer: 59 | Admitting: *Deleted

## 2022-04-23 DIAGNOSIS — Z7901 Long term (current) use of anticoagulants: Secondary | ICD-10-CM | POA: Diagnosis not present

## 2022-04-23 DIAGNOSIS — I359 Nonrheumatic aortic valve disorder, unspecified: Secondary | ICD-10-CM

## 2022-04-23 DIAGNOSIS — Z952 Presence of prosthetic heart valve: Secondary | ICD-10-CM | POA: Diagnosis not present

## 2022-04-23 LAB — POCT INR: INR: 2.4 (ref 2.0–3.0)

## 2022-04-23 NOTE — Patient Instructions (Signed)
Description   Only take 1/2 tablet today and then continue 1 tablet daily except 1/2 tablet Monday and Friday. Repeat INR in 4 weeks Coumadin Coumadin [phone]

## 2022-04-25 ENCOUNTER — Other Ambulatory Visit: Payer: Self-pay | Admitting: Cardiology

## 2022-04-25 ENCOUNTER — Other Ambulatory Visit: Payer: Self-pay | Admitting: Family Medicine

## 2022-05-21 ENCOUNTER — Ambulatory Visit: Payer: 59

## 2022-05-28 ENCOUNTER — Ambulatory Visit: Payer: 59 | Attending: Cardiovascular Disease

## 2022-05-28 DIAGNOSIS — Z952 Presence of prosthetic heart valve: Secondary | ICD-10-CM | POA: Diagnosis not present

## 2022-05-28 DIAGNOSIS — Z7901 Long term (current) use of anticoagulants: Secondary | ICD-10-CM | POA: Diagnosis not present

## 2022-05-28 DIAGNOSIS — I359 Nonrheumatic aortic valve disorder, unspecified: Secondary | ICD-10-CM

## 2022-05-28 LAB — POCT INR: INR: 1.8 — AB (ref 2.0–3.0)

## 2022-05-28 NOTE — Patient Instructions (Signed)
Description   Continue 1 tablet daily except 1/2 tablet Monday and Friday. Repeat INR in 5 weeks Coumadin Coumadin [phone]

## 2022-06-20 ENCOUNTER — Other Ambulatory Visit: Payer: Self-pay | Admitting: Family Medicine

## 2022-06-20 DIAGNOSIS — L301 Dyshidrosis [pompholyx]: Secondary | ICD-10-CM

## 2022-06-21 NOTE — Progress Notes (Unsigned)
[place] [facility] [address] [place], [place] [address] [phone]  Date:  06/25/2022   Name:  [name]   DOB:  25-Apr-1971   MRN:  [id number]  PCP:  Darreld Mclean, MD    Chief Complaint: No chief complaint on file.   History of Present Illness:  [name] is a 51 y.o. very pleasant female patient who presents with the following:  Seen today for a recheck Last seen by myself in April  History of post radiation hypothyroidism, hepatitis B, aortic valve disorder due to rheumatic fever with valve replacement on Coumadin, vitamin D deficiency Her anticoagulation is managed by Coumadin clinic Hematologist for iron deficiency anemia is Dr. Tamala Julian with [facility] Her hepatitis B [facility]  Flu shot Pap is UTD   Coumadin Levothyroxine Due for labs today   Patient Active Problem List   Diagnosis Date Noted   Hypothyroidism, postradioiodine therapy 02/04/2017   Vasovagal syncope 05/02/2015   Hepatitis B 04/28/2012   Aortic valve disorder 02/11/2012   Long term (current) use of anticoagulants 02/11/2012   S/P AVR 12/16/2011   Chronic anticoagulation 12/16/2011   PULMONARY NODULE 01/31/2010   DYSPNEA 01/31/2010    Past Medical History:  Diagnosis Date   Femur fracture, left ([place]) 1990   with open reduction and internal fixation    H/O: rheumatic fever    Hepatitis B carrier ([place])    positive hepatitis B carrier; maintained on Baraclude; followed by Hepatitis clinic.   History of allergic rhinitis    Severe aortic insufficiency 09/01/2004   s/p aortic valve replacement.      Past Surgical History:  Procedure Laterality Date   AORTIC VALVE REPLACEMENT  4/06   68m On-X mechanical valve   CARDIAC CATHETERIZATION  11/15/2004   Ejection fraction is estimated 55%   HEMORRHOID SURGERY      Social History   Tobacco Use   Smoking status: Never   Smokeless tobacco: Never   Vaping Use   Vaping Use: Never used  Substance Use Topics   Alcohol use: No    Alcohol/week: 0.0 standard drinks of alcohol   Drug use: No    Family History  Problem Relation Age of Onset   Heart disease Brother        valvular dysfunction; s/p replacement   Hypertension Mother    Hyperlipidemia Mother    Hypothyroidism Mother    Heart disease Father    Hypertension Father    Hyperlipidemia Father    Stomach cancer Maternal Grandmother    Colon cancer Neg Hx    Rectal cancer Neg Hx    Colon polyps Neg Hx     No Known Allergies  Medication list has been reviewed and updated.  Current Outpatient Medications on File Prior to Visit  Medication Sig Dispense Refill   levothyroxine (SYNTHROID) 75 MCG tablet TAKE 1 TABLET BY MOUTH EVERY DAY BEFORE BREAKFAST 30 tablet 2   triamcinolone ointment (KENALOG) 0.5 % APPLY TO DRY SKIN ON HANDS ON HANDS ONCE DAILY AS NEEDED 60 g 0   valACYclovir (VALTREX) 1000 MG tablet Take 1 tablet (1,000 mg total) by mouth 3 (three) times daily. 15 tablet 0   warfarin (COUMADIN) 5 MG tablet TAKE 1 TABLET EVERY DAY AS DIRECTED BU COUMADIN CLINIC 30 tablet 3   No current facility-administered medications on file prior to visit.    Review of  Systems:  As per HPI- otherwise negative.   Physical Examination: There were no vitals filed for this visit. There were no vitals filed for this visit. There is no height or weight on file to calculate BMI. Ideal Body Weight:    GEN: no acute distress. HEENT: Atraumatic, Normocephalic.  Ears and Nose: No external deformity. CV: RRR, No M/G/R. No JVD. No thrill. No extra heart sounds. PULM: CTA B, no wheezes, crackles, rhonchi. No retractions. No resp. distress. No accessory muscle use. ABD: S, NT, ND, +BS. No rebound. No HSM. EXTR: No c/c/e PSYCH: Normally interactive. Conversant.    Assessment and Plan: ***  Signed Lamar Blinks, MD

## 2022-06-21 NOTE — Patient Instructions (Incomplete)
Good to see you today I will be in touch with your labs Recommend covid booster this fall if not done already We will order a thyroid ultrasound for you- please stop at imaging on the ground floor today to schedule Assuming all is well please see me in 6 months

## 2022-06-24 NOTE — Progress Notes (Signed)
[name] Date of Birth: Jan 04, 1971 Medical Record #[id number]  History of Present Illness: [name] is seen today for followup. She has a history of severe aortic insufficiency and underwent aortic valve replacement with a mechanical prosthesis in April of 2006. This was a #21 mm On-X valve. She does have some chronic mild dyspnea. She's had extensive pulmonary and cardiac evaluation for this which has been unremarkable. She is on chronic anticoagulation. She does have a history of hepatitis B and hyperthyroidism s/p radioactive iodine ablation. Last Echo in June 2022 showed normally functioning AV prosthesis.   On follow up today she is doing well.  She states she is walking daily 2 miles. She is working. She denies any chest pain or palpitations. Energy level is good. No significant  dyspnea.  Current Outpatient Medications on File Prior to Visit  Medication Sig Dispense Refill   levothyroxine (SYNTHROID) 75 MCG tablet TAKE 1 TABLET BY MOUTH EVERY DAY BEFORE BREAKFAST 90 tablet 3   triamcinolone ointment (KENALOG) 0.5 % APPLY TO DRY SKIN ON HANDS ON HANDS ONCE DAILY AS NEEDED 60 g 0   warfarin (COUMADIN) 5 MG tablet TAKE 1 TABLET EVERY DAY AS DIRECTED BU COUMADIN CLINIC 30 tablet 3   No current facility-administered medications on file prior to visit.    No Known Allergies  Past Medical History:  Diagnosis Date   Femur fracture, left ([place]) 1990   with open reduction and internal fixation    H/O: rheumatic fever    Hepatitis B carrier ([place])    positive hepatitis B carrier; maintained on Baraclude; followed by Hepatitis clinic.   History of allergic rhinitis    Severe aortic insufficiency 09/01/2004   s/p aortic valve replacement.      Past Surgical History:  Procedure Laterality Date   AORTIC VALVE REPLACEMENT  4/06   7m On-X mechanical valve   CARDIAC CATHETERIZATION  11/15/2004   Ejection fraction is estimated 55%   HEMORRHOID SURGERY      Social History   Tobacco Use   Smoking Status Never  Smokeless Tobacco Never    Social History   Substance and Sexual Activity  Alcohol Use No   Alcohol/week: 0.0 standard drinks of alcohol    Family History  Problem Relation Age of Onset   Heart disease Brother        valvular dysfunction; s/p replacement   Hypertension Mother    Hyperlipidemia Mother    Hypothyroidism Mother    Heart disease Father    Hypertension Father    Hyperlipidemia Father    Stomach cancer Maternal Grandmother    Colon cancer Neg Hx    Rectal cancer Neg Hx    Colon polyps Neg Hx     Review of Systems: As noted in history of present illness.  All other systems were reviewed and are negative.  Physical Exam: BP 118/66   Pulse 68   Ht '5\' 4"'$  (1.626 m)   Wt 135 lb 3.2 oz (61.3 kg)   SpO2 98%   BMI 23.21 kg/m  GENERAL:  Well appearing Asian female in NAD HEENT:  PERRL, EOMI, sclera are clear. Oropharynx is clear. NECK:  No jugular venous distention, carotid upstroke brisk and symmetric, no bruits, no thyromegaly or adenopathy LUNGS:  Clear to auscultation bilaterally CHEST:  Unremarkable HEART:  RRR,  PMI not displaced or sustained, normal mechanical AV click, no S3, no S4: no clicks, no rubs, no murmurs ABD:  Soft, nontender. BS +, no masses or  bruits. No hepatomegaly, no splenomegaly EXT:  2 + pulses throughout, no edema, no cyanosis no clubbing SKIN:  Warm and dry.  No rashes NEURO:  Alert and oriented x 3. Cranial nerves II through XII intact. PSYCH:  Cognitively intact    LABORATORY DATA:  Lab Results  Component Value Date   WBC 3.7 (L) 06/25/2022   HGB 13.6 06/25/2022   HCT 41.6 06/25/2022   PLT 262.0 06/25/2022   GLUCOSE 85 06/25/2022   CHOL 193 06/25/2022   TRIG 63.0 06/25/2022   HDL 55.30 06/25/2022   LDLCALC 125 (H) 06/25/2022   ALT 13 06/25/2022   AST 18 06/25/2022   NA 138 06/25/2022   K 4.2 06/25/2022   CL 103 06/25/2022   CREATININE 0.52 06/25/2022   BUN 14 06/25/2022   CO2 30 06/25/2022    TSH 0.39 06/25/2022   INR 1.8 (A) 05/28/2022   HGBA1C 5.8 06/25/2022   Ecg today shows NSR rate 68.  Nonspecific TWA. I have personally reviewed and interpreted this study.  Echo 12/12/14:Study Conclusions  - Left ventricle: The cavity size was normal. Wall thickness was   normal. Systolic function was normal. The estimated ejection   fraction was in the range of 55% to 60%. Left ventricular   diastolic function parameters were normal. - Aortic valve: There was a mechanical aortic valve. No significant   stenosis. There was trivial regurgitation. Mean gradient (S): 11   mm Hg. - Mitral valve: There was mild regurgitation. - Right ventricle: The cavity size was normal. Systolic function   was normal. - Tricuspid valve: Peak RV-RA gradient (S): 22 mm Hg. - Pulmonary arteries: PA peak pressure: 25 mm Hg (S). - Inferior vena cava: The vessel was normal in size. The   respirophasic diameter changes were in the normal range (= 50%),   consistent with normal central venous pressure.  Impressions:  - Normal LV size with EF 55-60%. Normal diastolic function.c   Normally functioning mechanical aortic valve. Mild MR. Normal RV   size and systolic function.  Echo 02/13/21: IMPRESSIONS     1. Left ventricular ejection fraction, by estimation, is 70 to 75%. The  left ventricle has hyperdynamic function. The left ventricle has no  regional wall motion abnormalities. Left ventricular diastolic parameters  are consistent with Grade I diastolic  dysfunction (impaired relaxation).   2. Right ventricular systolic function is normal. The right ventricular  size is normal.   3. The mitral valve is abnormal. Trivial mitral valve regurgitation.   4. The aortic valve has been repaired/replaced. Aortic valve  regurgitation is trivial. There is a 21 mm On-X mechanical valve present  in the aortic position. Procedure Date: 11/2004. Echo findings are  consistent with normal structure and function of the   aortic valve prosthesis. Aortic valve area, by VTI measures 1.33 cm.  Aortic valve mean gradient measures 10.0 mmHg. Aortic valve Vmax measures  2.20 m/s.   Assessment / Plan: 1. Status post mechanical aortic valve replacement for severe aortic insufficiency. Good click on exam. Echo in June 2022 showed good valve function. Continue anticoagulation and routine SBE prophylaxis.    2.  Hepatitis B.  3. Graves disease s/p radioactive thyroid ablation. TSH normal.   Follow up in one year.

## 2022-06-25 ENCOUNTER — Ambulatory Visit (HOSPITAL_BASED_OUTPATIENT_CLINIC_OR_DEPARTMENT_OTHER)
Admission: RE | Admit: 2022-06-25 | Discharge: 2022-06-25 | Disposition: A | Discharge status: Home / Self Care | Payer: 59 | Source: Ambulatory Visit | Attending: Family Medicine | Admitting: Family Medicine

## 2022-06-25 ENCOUNTER — Ambulatory Visit (INDEPENDENT_AMBULATORY_CARE_PROVIDER_SITE_OTHER): Payer: 59 | Admitting: Family Medicine

## 2022-06-25 VITALS — BP 110/70 | HR 75 | Temp 97.7°F | Resp 18 | Ht 64.0 in | Wt 137.6 lb

## 2022-06-25 DIAGNOSIS — E039 Hypothyroidism, unspecified: Secondary | ICD-10-CM | POA: Insufficient documentation

## 2022-06-25 DIAGNOSIS — D509 Iron deficiency anemia, unspecified: Secondary | ICD-10-CM | POA: Diagnosis not present

## 2022-06-25 DIAGNOSIS — Z13220 Encounter for screening for lipoid disorders: Secondary | ICD-10-CM | POA: Diagnosis not present

## 2022-06-25 DIAGNOSIS — E0590 Thyrotoxicosis, unspecified without thyrotoxic crisis or storm: Secondary | ICD-10-CM | POA: Diagnosis not present

## 2022-06-25 DIAGNOSIS — E559 Vitamin D deficiency, unspecified: Secondary | ICD-10-CM

## 2022-06-25 DIAGNOSIS — Z131 Encounter for screening for diabetes mellitus: Secondary | ICD-10-CM

## 2022-06-25 DIAGNOSIS — E041 Nontoxic single thyroid nodule: Secondary | ICD-10-CM | POA: Diagnosis not present

## 2022-06-25 LAB — VITAMIN D 25 HYDROXY (VIT D DEFICIENCY, FRACTURES): VITD: 33.17 ng/mL (ref 30.00–100.00)

## 2022-06-25 LAB — COMPREHENSIVE METABOLIC PANEL
ALT: 13 U/L (ref 0–35)
AST: 18 U/L (ref 0–37)
Albumin: 4.2 g/dL (ref 3.5–5.2)
Alkaline Phosphatase: 49 U/L (ref 39–117)
BUN: 14 mg/dL (ref 6–23)
CO2: 30 mEq/L (ref 19–32)
Calcium: 9.4 mg/dL (ref 8.4–10.5)
Chloride: 103 mEq/L (ref 96–112)
Creatinine, Ser: 0.52 mg/dL (ref 0.40–1.20)
GFR: 108.92 mL/min (ref >60.00)
Glucose, Bld: 85 mg/dL (ref 70–99)
Potassium: 4.2 mEq/L (ref 3.5–5.1)
Sodium: 138 mEq/L (ref 135–145)
Total Bilirubin: 0.6 mg/dL (ref 0.2–1.2)
Total Protein: 7.4 g/dL (ref 6.0–8.3)

## 2022-06-25 LAB — CBC
HCT: 41.6 % (ref 36.0–46.0)
Hemoglobin: 13.6 g/dL (ref 12.0–15.0)
MCHC: 32.7 g/dL (ref 30.0–36.0)
MCV: 88.8 fl (ref 78.0–100.0)
Platelets: 262 10*3/uL (ref 150.0–400.0)
RBC: 4.68 Mil/uL (ref 3.87–5.11)
RDW: 13.6 % (ref 11.5–15.5)
WBC: 3.7 10*3/uL — ABNORMAL LOW (ref 4.0–10.5)

## 2022-06-25 LAB — TSH: TSH: 0.39 u[IU]/mL (ref 0.35–5.50)

## 2022-06-25 LAB — LIPID PANEL
Cholesterol: 193 mg/dL (ref 0–200)
HDL: 55.3 mg/dL (ref >39.00)
LDL Cholesterol: 125 mg/dL — ABNORMAL HIGH (ref 0–99)
NonHDL: 137.88
Total CHOL/HDL Ratio: 3
Triglycerides: 63 mg/dL (ref 0.0–149.0)
VLDL: 12.6 mg/dL (ref 0.0–40.0)

## 2022-06-25 LAB — HEMOGLOBIN A1C: Hgb A1c MFr Bld: 5.8 % (ref 4.6–6.5)

## 2022-06-25 MED ORDER — LEVOTHYROXINE SODIUM 75 MCG PO TABS: ORAL_TABLET | ORAL | 3 refills | Status: DC

## 2022-07-08 ENCOUNTER — Encounter: Payer: Self-pay | Admitting: Cardiology

## 2022-07-08 ENCOUNTER — Ambulatory Visit: Payer: 59 | Attending: Cardiology | Admitting: Cardiology

## 2022-07-08 ENCOUNTER — Ambulatory Visit (INDEPENDENT_AMBULATORY_CARE_PROVIDER_SITE_OTHER): Payer: 59

## 2022-07-08 VITALS — BP 118/66 | HR 68 | Ht 64.0 in | Wt 135.2 lb

## 2022-07-08 DIAGNOSIS — Z7901 Long term (current) use of anticoagulants: Secondary | ICD-10-CM | POA: Diagnosis not present

## 2022-07-08 DIAGNOSIS — I351 Nonrheumatic aortic (valve) insufficiency: Secondary | ICD-10-CM

## 2022-07-08 DIAGNOSIS — I359 Nonrheumatic aortic valve disorder, unspecified: Secondary | ICD-10-CM | POA: Diagnosis not present

## 2022-07-08 DIAGNOSIS — Z952 Presence of prosthetic heart valve: Secondary | ICD-10-CM | POA: Diagnosis not present

## 2022-07-08 LAB — POCT INR: INR: 1.8 — AB (ref 2.0–3.0)

## 2022-07-08 NOTE — Patient Instructions (Signed)
Continue 1 tablet daily except 1/2 tablet Monday and Friday. Repeat INR in 6 weeks Coumadin Coumadin [phone]

## 2022-07-30 ENCOUNTER — Other Ambulatory Visit: Payer: Self-pay | Admitting: Nurse Practitioner

## 2022-07-30 DIAGNOSIS — K760 Fatty (change of) liver, not elsewhere classified: Secondary | ICD-10-CM | POA: Diagnosis not present

## 2022-07-30 DIAGNOSIS — R69 Illness, unspecified: Secondary | ICD-10-CM | POA: Diagnosis not present

## 2022-07-30 DIAGNOSIS — B181 Chronic viral hepatitis B without delta-agent: Secondary | ICD-10-CM

## 2022-08-13 ENCOUNTER — Ambulatory Visit
Admission: RE | Admit: 2022-08-13 | Discharge: 2022-08-13 | Disposition: A | Discharge status: Home / Self Care | Payer: 59 | Source: Ambulatory Visit | Attending: Nurse Practitioner | Admitting: Nurse Practitioner

## 2022-08-13 DIAGNOSIS — B181 Chronic viral hepatitis B without delta-agent: Secondary | ICD-10-CM

## 2022-08-13 DIAGNOSIS — R69 Illness, unspecified: Secondary | ICD-10-CM | POA: Diagnosis not present

## 2022-08-20 ENCOUNTER — Ambulatory Visit: Payer: 59 | Attending: Cardiology

## 2022-08-20 DIAGNOSIS — Z7901 Long term (current) use of anticoagulants: Secondary | ICD-10-CM

## 2022-08-20 DIAGNOSIS — I359 Nonrheumatic aortic valve disorder, unspecified: Secondary | ICD-10-CM

## 2022-08-20 LAB — POCT INR: INR: 2.3 (ref 2.0–3.0)

## 2022-08-20 NOTE — Patient Instructions (Signed)
Description   Only take 1/2 tablet today and then continue 1 tablet daily except 1/2 tablet Monday and Friday.  Repeat INR in 6 weeks Coumadin Coumadin [phone]

## 2022-09-17 DIAGNOSIS — Z1231 Encounter for screening mammogram for malignant neoplasm of breast: Secondary | ICD-10-CM | POA: Diagnosis not present

## 2022-09-22 ENCOUNTER — Other Ambulatory Visit: Payer: Self-pay | Admitting: Family Medicine

## 2022-09-22 DIAGNOSIS — L301 Dyshidrosis [pompholyx]: Secondary | ICD-10-CM

## 2022-10-01 ENCOUNTER — Ambulatory Visit: Payer: 59

## 2022-10-08 ENCOUNTER — Ambulatory Visit: Payer: 59 | Attending: Cardiology

## 2022-10-08 DIAGNOSIS — I359 Nonrheumatic aortic valve disorder, unspecified: Secondary | ICD-10-CM

## 2022-10-08 DIAGNOSIS — Z7901 Long term (current) use of anticoagulants: Secondary | ICD-10-CM | POA: Diagnosis not present

## 2022-10-08 LAB — POCT INR: INR: 1.4 — AB (ref 2.0–3.0)

## 2022-10-08 NOTE — Patient Instructions (Signed)
Description   Take 1.5 tablets today and then continue 1 tablet daily except 1/2 tablet Monday and Friday.  Repeat INR in 4 weeks Coumadin Coumadin [phone]

## 2022-10-15 DIAGNOSIS — Z01419 Encounter for gynecological examination (general) (routine) without abnormal findings: Secondary | ICD-10-CM | POA: Diagnosis not present

## 2022-10-15 DIAGNOSIS — Z124 Encounter for screening for malignant neoplasm of cervix: Secondary | ICD-10-CM | POA: Diagnosis not present

## 2022-10-15 DIAGNOSIS — Z01411 Encounter for gynecological examination (general) (routine) with abnormal findings: Secondary | ICD-10-CM | POA: Diagnosis not present

## 2022-10-15 DIAGNOSIS — Z6823 Body mass index (BMI) 23.0-23.9, adult: Secondary | ICD-10-CM | POA: Diagnosis not present

## 2022-10-15 LAB — RESULTS CONSOLE HPV: CHL HPV: NEGATIVE

## 2022-10-15 LAB — HM PAP SMEAR: HM Pap smear: NORMAL

## 2022-10-24 ENCOUNTER — Other Ambulatory Visit: Payer: Self-pay | Admitting: Cardiology

## 2022-11-05 ENCOUNTER — Ambulatory Visit: Payer: 59

## 2022-11-12 ENCOUNTER — Ambulatory Visit: Payer: 59 | Attending: Internal Medicine | Admitting: *Deleted

## 2022-11-12 DIAGNOSIS — Z952 Presence of prosthetic heart valve: Secondary | ICD-10-CM

## 2022-11-12 DIAGNOSIS — I359 Nonrheumatic aortic valve disorder, unspecified: Secondary | ICD-10-CM

## 2022-11-12 DIAGNOSIS — Z7901 Long term (current) use of anticoagulants: Secondary | ICD-10-CM | POA: Diagnosis not present

## 2022-11-12 LAB — POCT INR: INR: 3.1 — AB (ref 2.0–3.0)

## 2022-11-12 NOTE — Patient Instructions (Signed)
Description   Do not take any warfarin today then continue 1 tablet daily except 1/2 tablet Monday and Friday. Repeat INR in 3 weeks. Coumadin Coumadin [phone]

## 2022-11-13 ENCOUNTER — Ambulatory Visit: Payer: 59

## 2022-12-03 ENCOUNTER — Ambulatory Visit: Payer: 59

## 2022-12-11 ENCOUNTER — Telehealth: Payer: Self-pay | Admitting: *Deleted

## 2022-12-11 ENCOUNTER — Ambulatory Visit: Payer: 59 | Attending: Cardiology

## 2022-12-11 NOTE — Telephone Encounter (Signed)
Called pt since she missed her appointment this morning. There was no answer therefore left her a message. Will await a call back to reschedule.

## 2022-12-12 NOTE — Progress Notes (Unsigned)
[facility] [address] [place], [place] [address] [phone]  Date:  12/15/2022   Name:  [name]   DOB:  August 23, 1971   MRN:  [id number]  PCP:  Pearline Cables, MD    Chief Complaint: No chief complaint on file.   History of Present Illness:  [name] is a 52 y.o. very pleasant female patient who presents with the following:  Patient seen today for 58-month follow-up Most recent visit with myself was in October  History of post radiation hypothyroidism, hepatitis B, aortic valve disorder due to rheumatic fever with valve replacement on Coumadin, vitamin D deficiency Her anticoagulation is managed by Coumadin clinic Hematologist for iron deficiency anemia is Dr. Katrinka Blazing with [facility] Her [facility].  She had a FibroScan on last year which looked okay.  They plan to do an annual liver ultrasound  She was seen by her gynecologist, Dr. Normand Sloop in February  Recommend COVID-19 booster Mammogram- per Dr Normand Sloop  She started her Shingrix series, too early for second dose today   Levothyroxine 75 Coumadin  Pt notes she seems to be in menopause- her last cycle was about one year ago She has noted some trouble sleeping for a long time She did try melatonin- this did work for her but she was afraid of using it for a long time She has a tiny mole on her right cheek- it has been there as long as she can remember  It might be getting a little bigger   Lab Results  Component Value Date   TSH 0.39 06/25/2022     Patient Active Problem List   Diagnosis Date Noted   Hypothyroidism, postradioiodine therapy 02/04/2017   Vasovagal syncope 05/02/2015   Hepatitis B 04/28/2012   Aortic valve disorder 02/11/2012   Long term (current) use of anticoagulants 02/11/2012   S/P AVR 12/16/2011   Chronic anticoagulation 12/16/2011   PULMONARY NODULE  01/31/2010   DYSPNEA 01/31/2010    Past Medical History:  Diagnosis Date   Femur fracture, left (HCC) 1990   with open reduction and internal fixation    H/O: rheumatic fever    Hepatitis B carrier (HCC)    positive hepatitis B carrier; maintained on Baraclude; followed by Hepatitis clinic.   History of allergic rhinitis    Severe aortic insufficiency 09/01/2004   s/p aortic valve replacement.      Past Surgical History:  Procedure Laterality Date   AORTIC VALVE REPLACEMENT  4/06   21mm On-X mechanical valve   CARDIAC CATHETERIZATION  11/15/2004   Ejection fraction is estimated 55%   HEMORRHOID SURGERY      Social History   Tobacco Use   Smoking status: Never   Smokeless tobacco: Never  Vaping Use   Vaping Use: Never used  Substance Use Topics   Alcohol use: No    Alcohol/week: 0.0 standard drinks of alcohol   Drug use: No    Family History  Problem Relation Age of Onset   Heart disease Brother        valvular dysfunction; s/p replacement   Hypertension Mother    Hyperlipidemia Mother    Hypothyroidism Mother    Heart disease Father    Hypertension Father    Hyperlipidemia Father    Stomach cancer Maternal Grandmother    Colon cancer Neg Hx    Rectal  cancer Neg Hx    Colon polyps Neg Hx     No Known Allergies  Medication list has been reviewed and updated.  Current Outpatient Medications on File Prior to Visit  Medication Sig Dispense Refill   levothyroxine (SYNTHROID) 75 MCG tablet TAKE 1 TABLET BY MOUTH EVERY DAY BEFORE BREAKFAST 90 tablet 3   triamcinolone ointment (KENALOG) 0.5 % APPLY TO DRY SKIN ON HANDS ON HANDS ONCE DAILY AS NEEDED 60 g 0   warfarin (COUMADIN) 5 MG tablet TAKE 1 TABLET EVERY DAY AS DIRECTED BY COUMADIN CLINIC 30 tablet 3   No current facility-administered medications on file prior to visit.    Review of Systems:  As per HPI- otherwise negative.   Physical Examination: Vitals:   12/15/22 1306  BP: 110/62  Pulse: 82   Resp: 18  Temp: 97.7 F (36.5 C)  SpO2: 97%   Vitals:   12/15/22 1306  Weight: 131 lb 12.8 oz (59.8 kg)  Height: 5\' 4"  (1.626 m)   Body mass index is 22.62 kg/m. Ideal Body Weight: Weight in (lb) to have BMI = 25: 145.3  GEN: no acute distress. Normal weight, looks well  HEENT: Atraumatic, Normocephalic.  Ears and Nose: No external deformity. CV: RRR, No M/G/R. No JVD. No thrill. No extra heart sounds. PULM: CTA B, no wheezes, crackles, rhonchi. No retractions. No resp. distress. No accessory muscle use. EXTR: No c/c/e PSYCH: Normally interactive. Conversant.  Tiny flat nevus on her right cheek; advised pt that taking this off will leave a scar, derm may not recommend removal  -she understands   Assessment and Plan: Acquired hypothyroidism - Plan: TSH  Benign nevus - Plan: Ambulatory referral to Dermatology  Medication monitoring encounter - Plan: Basic metabolic panel, CBC  Subacute cough - Plan: benzonatate (TESSALON PERLES) 100 MG capsule  Insomnia associated with menopause  Discussed mild insomnia, perhaps related with menopause.  Patient notes melatonin works well for her.  She is concerned about dependence from taking it long-term.  I advised her that typically longer-term use of melatonin is considered to be safe, she could also alternate with an over-the-counter antihistamine sleep medication if she would like.  Referral dermatology  She requests a refill for Tessalon Perles which I provided today  Follow-up on thyroid  Signed Abbe Amsterdam, MD  Received labs as below 4/16-all looks okay except we do need to adjust her thyroid medication Her to most recent TSH levels were also borderline low Called patient, will drop her levothyroxine from 75-65mcg and she will come in for a TSH level in 4-6 weeks  Results for orders placed or performed in visit on 12/15/22  TSH  Result Value Ref Range   TSH 0.30 (L) 0.35 - 5.50 uIU/mL  Basic metabolic panel  Result  Value Ref Range   Sodium 143 135 - 145 mEq/L   Potassium 3.9 3.5 - 5.1 mEq/L   Chloride 107 96 - 112 mEq/L   CO2 30 19 - 32 mEq/L   Glucose, Bld 99 70 - 99 mg/dL   BUN 14 6 - 23 mg/dL   Creatinine, Ser 7.89 0.40 - 1.20 mg/dL   GFR 381.01 >75.10 mL/min   Calcium 8.9 8.4 - 10.5 mg/dL  CBC  Result Value Ref Range   WBC 4.5 4.0 - 10.5 K/uL   RBC 4.45 3.87 - 5.11 Mil/uL   Platelets 268.0 150.0 - 400.0 K/uL   Hemoglobin 13.1 12.0 - 15.0 g/dL   HCT 25.8 52.7 - 78.2 %  MCV 88.8 78.0 - 100.0 fl   MCHC 33.1 30.0 - 36.0 g/dL   RDW 04.5 40.9 - 81.1 %

## 2022-12-12 NOTE — Patient Instructions (Incomplete)
It was good to see you again today! Recommend COVID booster if not done in the last 9 months or so I will be in touch with your labs For insomnia- I do think it is ok to use melatonin as needed.  You can also try benadryl or doxylamine as needed if you prefer

## 2022-12-15 ENCOUNTER — Ambulatory Visit (INDEPENDENT_AMBULATORY_CARE_PROVIDER_SITE_OTHER): Payer: 59 | Admitting: Family Medicine

## 2022-12-15 VITALS — BP 110/62 | HR 82 | Temp 97.7°F | Resp 18 | Ht 64.0 in | Wt 131.8 lb

## 2022-12-15 DIAGNOSIS — Z5181 Encounter for therapeutic drug level monitoring: Secondary | ICD-10-CM

## 2022-12-15 DIAGNOSIS — E039 Hypothyroidism, unspecified: Secondary | ICD-10-CM | POA: Diagnosis not present

## 2022-12-15 DIAGNOSIS — R052 Subacute cough: Secondary | ICD-10-CM | POA: Diagnosis not present

## 2022-12-15 DIAGNOSIS — N951 Menopausal and female climacteric states: Secondary | ICD-10-CM

## 2022-12-15 DIAGNOSIS — D229 Melanocytic nevi, unspecified: Secondary | ICD-10-CM | POA: Diagnosis not present

## 2022-12-15 MED ORDER — BENZONATATE 100 MG PO CAPS: 100.0000 mg | ORAL_CAPSULE | Freq: Three times a day (TID) | ORAL | 1 refills | Status: DC | PRN

## 2022-12-16 LAB — BASIC METABOLIC PANEL
BUN: 14 mg/dL (ref 6–23)
CO2: 30 mEq/L (ref 19–32)
Calcium: 8.9 mg/dL (ref 8.4–10.5)
Chloride: 107 mEq/L (ref 96–112)
Creatinine, Ser: 0.67 mg/dL (ref 0.40–1.20)
GFR: 102.13 mL/min (ref >60.00)
Glucose, Bld: 99 mg/dL (ref 70–99)
Potassium: 3.9 mEq/L (ref 3.5–5.1)
Sodium: 143 mEq/L (ref 135–145)

## 2022-12-16 LAB — CBC
HCT: 39.6 % (ref 36.0–46.0)
Hemoglobin: 13.1 g/dL (ref 12.0–15.0)
MCHC: 33.1 g/dL (ref 30.0–36.0)
MCV: 88.8 fl (ref 78.0–100.0)
Platelets: 268 10*3/uL (ref 150.0–400.0)
RBC: 4.45 Mil/uL (ref 3.87–5.11)
RDW: 14.1 % (ref 11.5–15.5)
WBC: 4.5 10*3/uL (ref 4.0–10.5)

## 2022-12-16 LAB — TSH: TSH: 0.3 u[IU]/mL — ABNORMAL LOW (ref 0.35–5.50)

## 2022-12-16 MED ORDER — LEVOTHYROXINE SODIUM 50 MCG PO TABS: ORAL_TABLET | ORAL | 1 refills | Status: DC

## 2022-12-16 NOTE — Addendum Note (Signed)
Addended by: Abbe Amsterdam C on: 12/16/2022 05:10 PM   Modules accepted: Orders

## 2022-12-25 ENCOUNTER — Ambulatory Visit: Payer: 59 | Attending: Cardiology | Admitting: *Deleted

## 2022-12-25 DIAGNOSIS — Z952 Presence of prosthetic heart valve: Secondary | ICD-10-CM

## 2022-12-25 DIAGNOSIS — Z7901 Long term (current) use of anticoagulants: Secondary | ICD-10-CM

## 2022-12-25 DIAGNOSIS — I359 Nonrheumatic aortic valve disorder, unspecified: Secondary | ICD-10-CM

## 2022-12-25 LAB — POCT INR: INR: 2.5 (ref 2.0–3.0)

## 2022-12-25 NOTE — Patient Instructions (Signed)
Description   Today take 1/2 tablet of warfarin then START taking 1 tablet daily except 1/2 tablet Monday, Wednesday, and Friday. Repeat INR in 3 weeks. Coumadin Coumadin [phone]

## 2023-01-08 IMAGING — US US ABDOMEN LIMITED
1 series · 14 of 25 positions shown · non-contrast
Comparison: Abdominal ultrasound 10/17/2020

CLINICAL DATA: Hepatitis-B

EXAM:
ULTRASOUND ABDOMEN LIMITED RIGHT UPPER QUADRANT

[Series 1: us abdomen limited · 0.22mm/px · 14 of 38 slices shown]
[im 1/38]
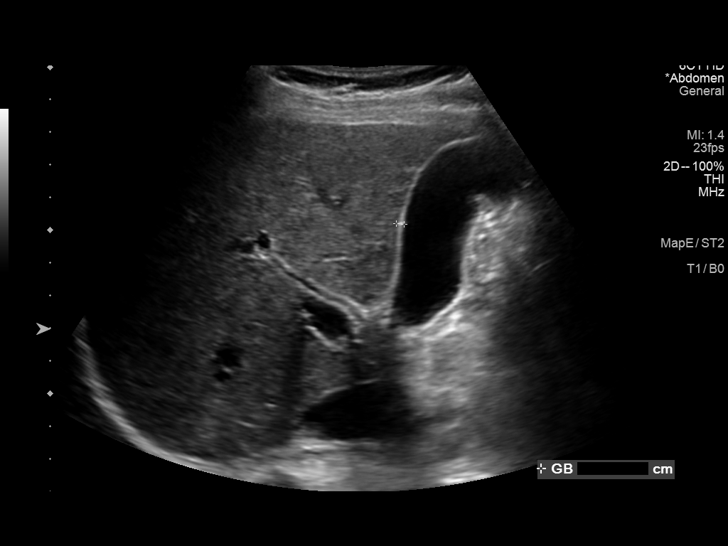
[im 4/38]
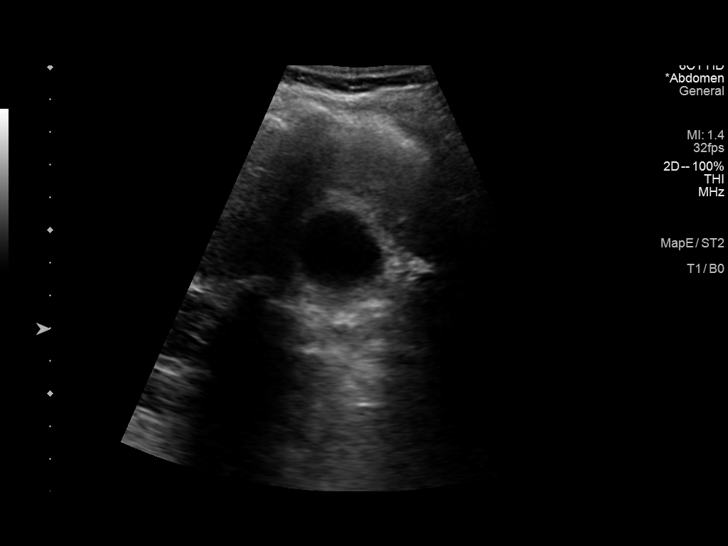
[im 7/38]
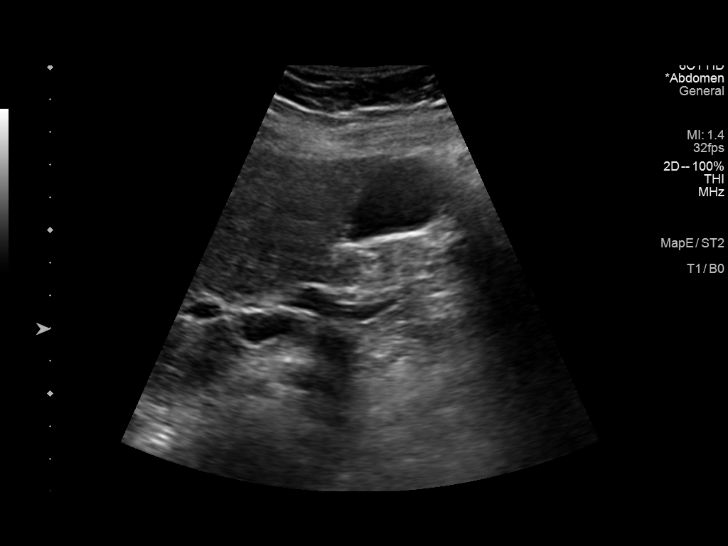
[im 10/38]
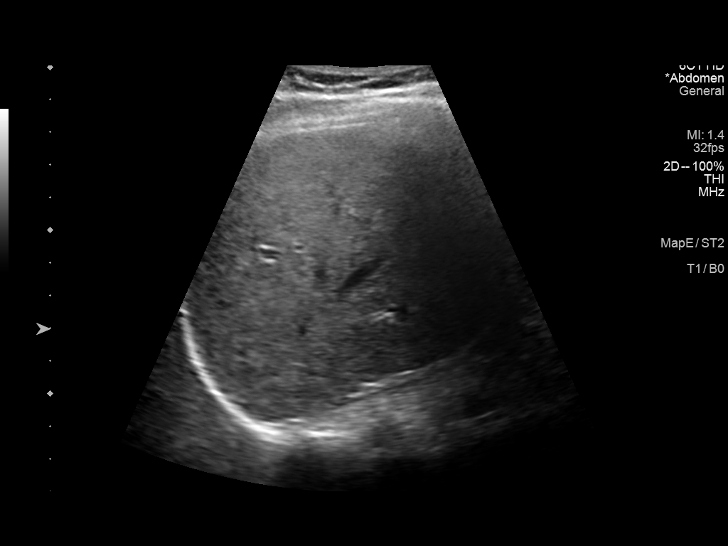
[im 13/38]
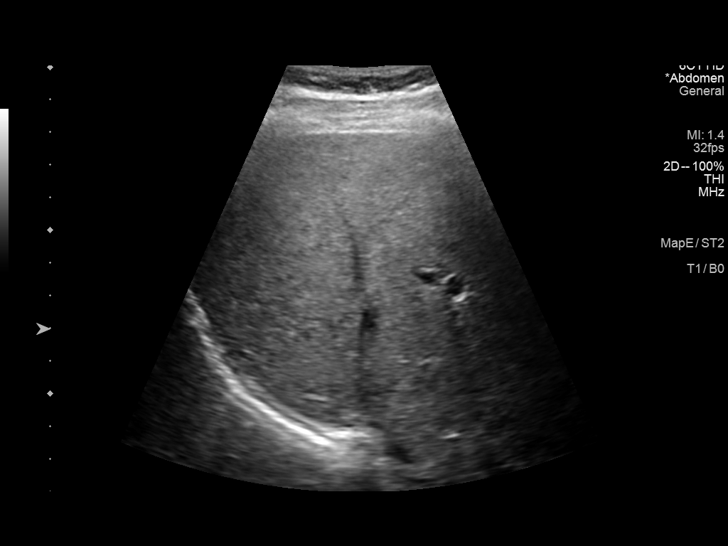
[im 14/38]
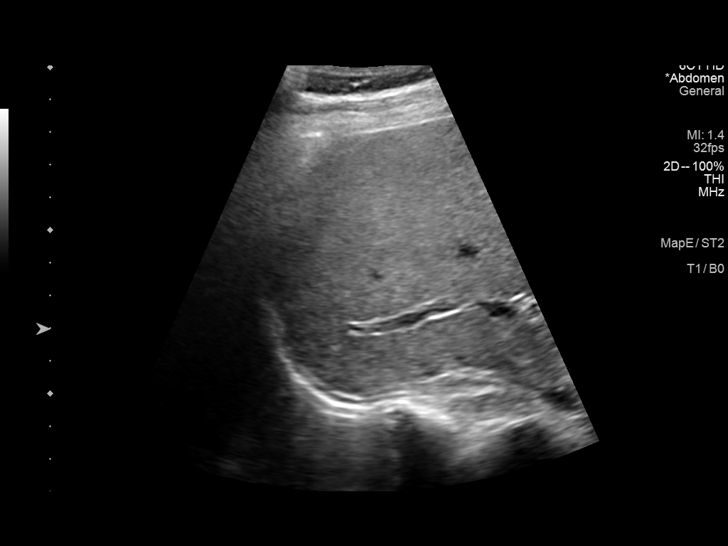
[im 17/38]
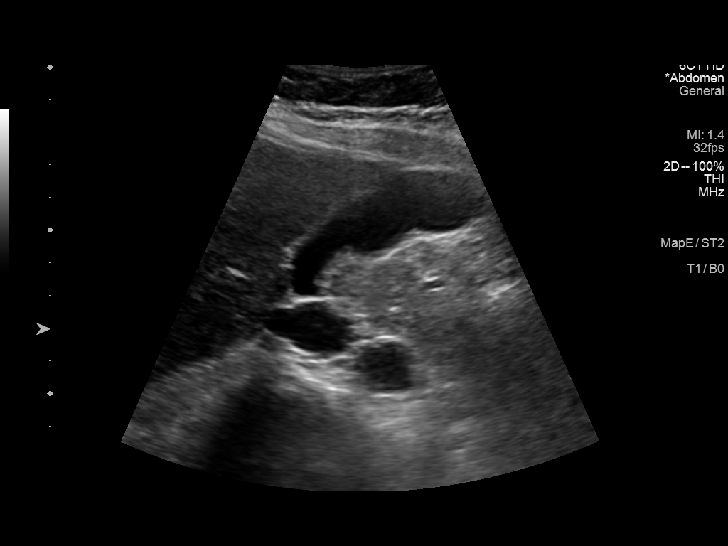
[im 21/38]
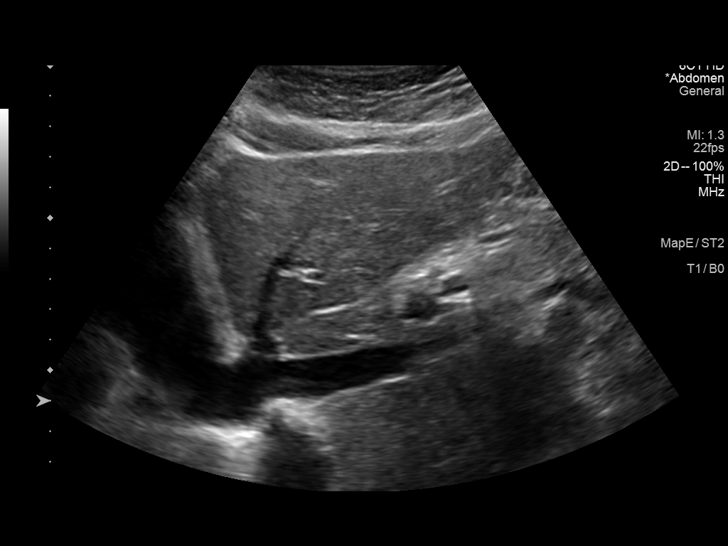
[im 24/38]
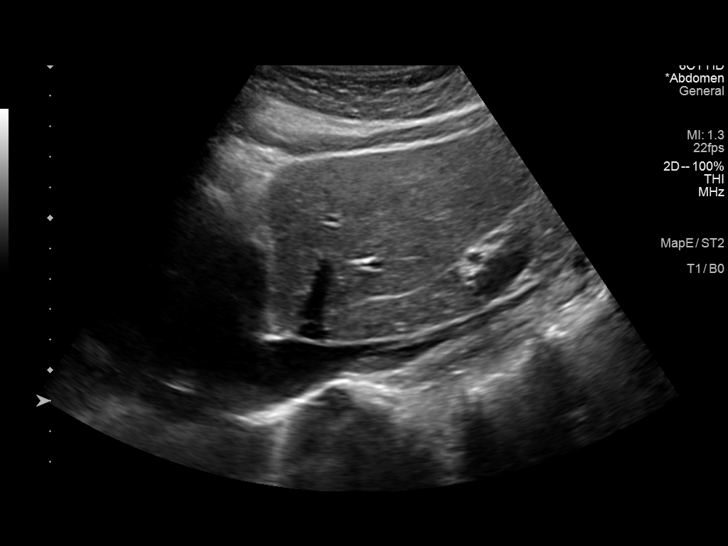
[im 25/38]
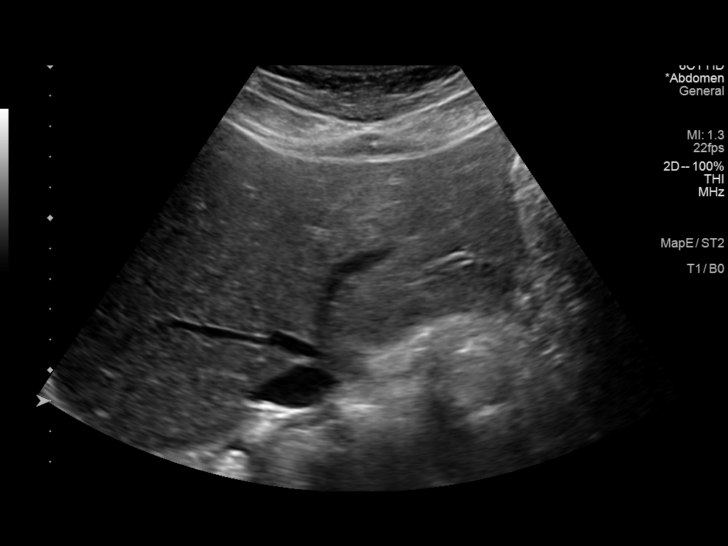
[im 28/38]
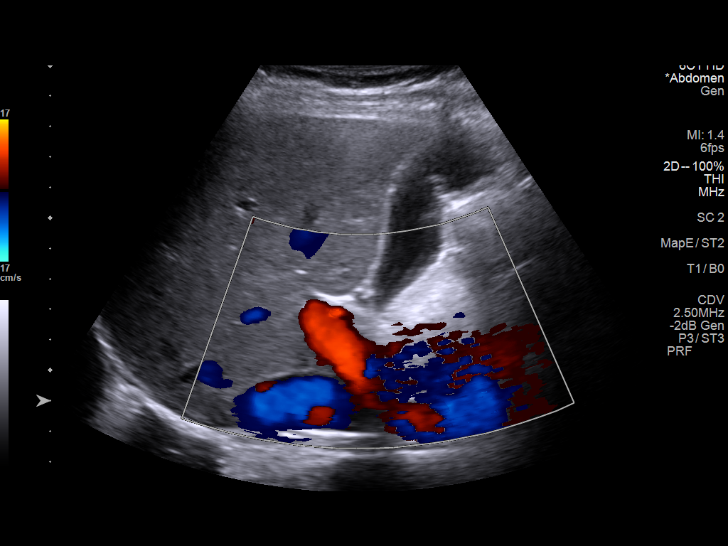
[im 31/38]
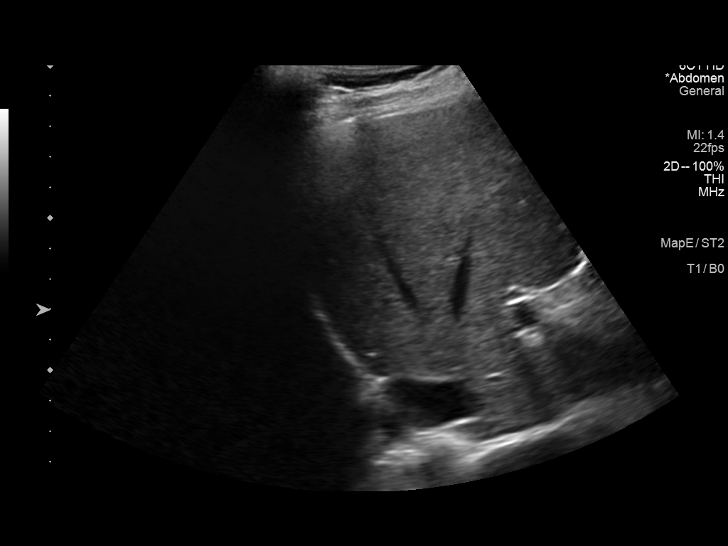
[im 34/38]
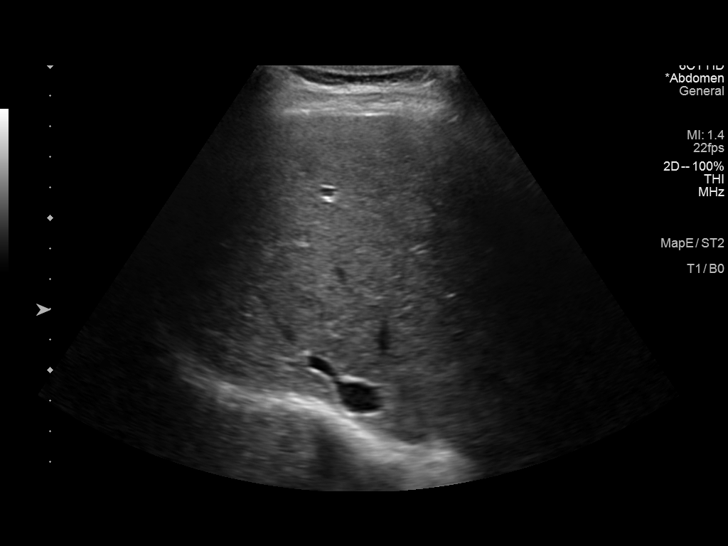
[im 38/38]
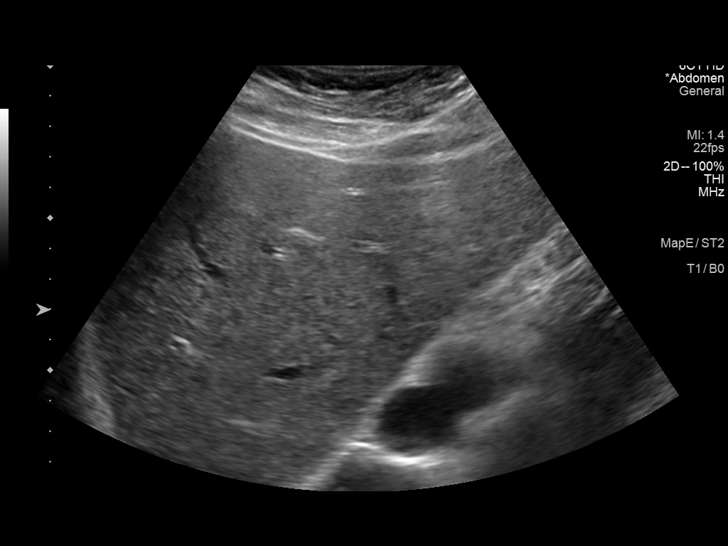

[14 of 25 positions shown; findings below may reference images not displayed]

FINDINGS: Gallbladder:

No gallstones or wall thickening visualized. No sonographic Murphy
sign noted by sonographer.

Common bile duct:

Diameter: 4 mm

Liver:

Stable course, mildly increased echogenicity of the parenchyma with
no focal mass identified. Portal vein is patent on color Doppler
imaging with normal direction of blood flow towards the liver.

Other: None.
IMPRESSION: No focal hepatic mass identified.

## 2023-01-12 ENCOUNTER — Ambulatory Visit: Payer: 59 | Attending: Internal Medicine

## 2023-01-12 ENCOUNTER — Telehealth: Payer: Self-pay | Admitting: *Deleted

## 2023-01-12 DIAGNOSIS — Z952 Presence of prosthetic heart valve: Secondary | ICD-10-CM | POA: Diagnosis not present

## 2023-01-12 DIAGNOSIS — Z7901 Long term (current) use of anticoagulants: Secondary | ICD-10-CM | POA: Diagnosis not present

## 2023-01-12 DIAGNOSIS — I359 Nonrheumatic aortic valve disorder, unspecified: Secondary | ICD-10-CM | POA: Diagnosis not present

## 2023-01-12 LAB — POCT INR: INR: 1.6 — AB (ref 2.0–3.0)

## 2023-01-12 NOTE — Patient Instructions (Signed)
Continue taking 1 tablet daily except 1/2 tablet Monday, Wednesday, and Friday. Repeat INR in 6 weeks. Coumadin Coumadin [phone]

## 2023-01-12 NOTE — Telephone Encounter (Signed)
Pt called and stated she was running behind and will be at the appointment at 1130am. Advised that will be fine and to call back if any later so she can rescheduled and she verbalized understanding.

## 2023-01-13 ENCOUNTER — Other Ambulatory Visit: Payer: Self-pay | Admitting: Family Medicine

## 2023-01-13 DIAGNOSIS — L301 Dyshidrosis [pompholyx]: Secondary | ICD-10-CM

## 2023-01-28 ENCOUNTER — Ambulatory Visit: Payer: 59 | Admitting: Physician Assistant

## 2023-02-02 ENCOUNTER — Other Ambulatory Visit: Payer: Self-pay | Admitting: Nurse Practitioner

## 2023-02-02 DIAGNOSIS — M67911 Unspecified disorder of synovium and tendon, right shoulder: Secondary | ICD-10-CM | POA: Diagnosis not present

## 2023-02-02 DIAGNOSIS — K760 Fatty (change of) liver, not elsewhere classified: Secondary | ICD-10-CM

## 2023-02-02 DIAGNOSIS — B181 Chronic viral hepatitis B without delta-agent: Secondary | ICD-10-CM | POA: Diagnosis not present

## 2023-02-14 ENCOUNTER — Other Ambulatory Visit: Payer: Self-pay | Admitting: Cardiology

## 2023-02-23 ENCOUNTER — Ambulatory Visit: Payer: 59 | Attending: Cardiology

## 2023-02-23 ENCOUNTER — Telehealth: Payer: Self-pay

## 2023-02-23 NOTE — Telephone Encounter (Signed)
done

## 2023-02-23 NOTE — Telephone Encounter (Signed)
Lpmtcb and reschedule INR check 

## 2023-03-09 ENCOUNTER — Other Ambulatory Visit: Payer: 59

## 2023-03-16 ENCOUNTER — Ambulatory Visit
Admission: RE | Admit: 2023-03-16 | Discharge: 2023-03-16 | Disposition: A | Discharge status: Home / Self Care | Payer: 59 | Source: Ambulatory Visit | Attending: Nurse Practitioner | Admitting: Nurse Practitioner

## 2023-03-16 DIAGNOSIS — K760 Fatty (change of) liver, not elsewhere classified: Secondary | ICD-10-CM

## 2023-03-16 DIAGNOSIS — B181 Chronic viral hepatitis B without delta-agent: Secondary | ICD-10-CM

## 2023-03-25 ENCOUNTER — Telehealth: Payer: Self-pay

## 2023-03-25 NOTE — Telephone Encounter (Signed)
Reached out to patient to set up follow up visit with provider to discuss chronic conditions.  Telephone encounter attempt : 1   A HIPAA compliant voice message was left requesting a return call.  Instructed patient to call office or to call me at [phone].  Elijio Miles [facility] Health Specialist

## 2023-04-01 NOTE — Telephone Encounter (Signed)
Reached out to patient to set up follow up visit with provider to discuss chronic conditions.  Telephone encounter attempt : 2   A HIPAA compliant voice message was left requesting a return call.  Instructed patient to call office or to call me at [phone].  Elijio Miles [facility] Health Specialist

## 2023-04-02 ENCOUNTER — Other Ambulatory Visit: Payer: Self-pay | Admitting: Family Medicine

## 2023-04-02 DIAGNOSIS — E039 Hypothyroidism, unspecified: Secondary | ICD-10-CM

## 2023-04-06 ENCOUNTER — Ambulatory Visit (INDEPENDENT_AMBULATORY_CARE_PROVIDER_SITE_OTHER): Payer: 59 | Admitting: Orthopaedic Surgery

## 2023-04-06 ENCOUNTER — Ambulatory Visit: Payer: 59 | Attending: Cardiovascular Disease | Admitting: *Deleted

## 2023-04-06 ENCOUNTER — Ambulatory Visit (INDEPENDENT_AMBULATORY_CARE_PROVIDER_SITE_OTHER): Payer: 59

## 2023-04-06 DIAGNOSIS — Z952 Presence of prosthetic heart valve: Secondary | ICD-10-CM | POA: Diagnosis not present

## 2023-04-06 DIAGNOSIS — M25511 Pain in right shoulder: Secondary | ICD-10-CM | POA: Diagnosis not present

## 2023-04-06 DIAGNOSIS — I359 Nonrheumatic aortic valve disorder, unspecified: Secondary | ICD-10-CM

## 2023-04-06 DIAGNOSIS — G8929 Other chronic pain: Secondary | ICD-10-CM | POA: Diagnosis not present

## 2023-04-06 DIAGNOSIS — Z7901 Long term (current) use of anticoagulants: Secondary | ICD-10-CM | POA: Diagnosis not present

## 2023-04-06 DIAGNOSIS — M7501 Adhesive capsulitis of right shoulder: Secondary | ICD-10-CM

## 2023-04-06 LAB — POCT INR: INR: 1.1 — AB (ref 2.0–3.0)

## 2023-04-06 MED ORDER — TRIAMCINOLONE ACETONIDE 40 MG/ML IJ SUSP
80.0000 mg | INTRAMUSCULAR | Status: AC | PRN
Start: 2023-04-06 — End: 2023-04-06
  Administered 2023-04-06: 80 mg via INTRA_ARTICULAR

## 2023-04-06 MED ORDER — LIDOCAINE HCL 1 % IJ SOLN
4.0000 mL | INTRAMUSCULAR | Status: AC | PRN
Start: 2023-04-06 — End: 2023-04-06
  Administered 2023-04-06: 4 mL

## 2023-04-06 NOTE — Progress Notes (Signed)
Chief Complaint: Right shoulder pain     History of Present Illness:    [name] is a 52 y.o. female right dominant female presents with ongoing right shoulder pain over the last several months that has been atraumatic in nature.  She is having pain when she reaches for objects.  She is experiencing this deep in the joint.  This has been waking her at night.  She has been seen by [facility] who did perform an injection 6 weeks prior    Surgical History:   none  PMH/PSH/Family History/Social History/Meds/Allergies:    Past Medical History:  Diagnosis Date  . Femur fracture, left (HCC) 1990   with open reduction and internal fixation   . H/O: rheumatic fever   . Hepatitis B carrier (HCC)    positive hepatitis B carrier; maintained on Baraclude; followed by Hepatitis clinic.  Marland Kitchen History of allergic rhinitis   . Severe aortic insufficiency 09/01/2004   s/p aortic valve replacement.     Past Surgical History:  Procedure Laterality Date  . AORTIC VALVE REPLACEMENT  4/06   21mm On-X mechanical valve  . CARDIAC CATHETERIZATION  11/15/2004   Ejection fraction is estimated 55%  . HEMORRHOID SURGERY     Social History   Socioeconomic History  . Marital status: Married    Spouse name: Not on file  . Number of children: 3  . Years of education: Not on file  . Highest education level: Not on file  Occupational History  . Occupation: Architect: [employer]  Tobacco Use  . Smoking status: Never  . Smokeless tobacco: Never  Vaping Use  . Vaping status: Never Used  Substance and Sexual Activity  . Alcohol use: No    Alcohol/week: 0.0 standard drinks of alcohol  . Drug use: No  . Sexual activity: Yes    Partners: Male  Other Topics Concern  . Not on file  Social History Narrative   Marital status: married x 18 years; happily married; no abuse.  From [place]; [place] since 1993.      Children: 3 boys (17, 29, 105)       Lives: with husband, 3 sons      Employment:  Agricultural engineer at [employer].      Tobacco: none      Alcohol: none      Drugs: none      Exercise: walking x 3-4 days; 30 minutes   Social Determinants of Health   Financial Resource Strain: Not on file  Food Insecurity: Low Risk  (07/30/2022)   Received from [facility]   Food vital sign   . Within the past 12 months, you worried that your food would run out before you got money to buy more: Never true   . Within the past 12 months, the food you bought just didn't last and you didn't have money to get more. : Never true  Transportation Needs: Not on file (07/30/2022)  Physical Activity: Not on file  Stress: Not on file  Social Connections: Not on file   Family History  Problem Relation Age of Onset  . Heart disease Brother        valvular dysfunction; s/p replacement  . Hypertension Mother   . Hyperlipidemia Mother   .  Hypothyroidism Mother   . Heart disease Father   . Hypertension Father   . Hyperlipidemia Father   . Stomach cancer Maternal Grandmother   . Colon cancer Neg Hx   . Rectal cancer Neg Hx   . Colon polyps Neg Hx    No Known Allergies Current Outpatient Medications  Medication Sig Dispense Refill  . benzonatate (TESSALON PERLES) 100 MG capsule Take 1 capsule (100 mg total) by mouth 3 (three) times daily as needed for cough. 90 capsule 1  . levothyroxine (SYNTHROID) 50 MCG tablet Take 1 tablet (50 mcg total) by mouth daily before breakfast. 30 tablet 0  . triamcinolone ointment (KENALOG) 0.5 % APPLY TO DRY SKIN ON HANDS ON HANDS ONCE DAILY AS NEEDED 60 g 0  . warfarin (COUMADIN) 5 MG tablet TAKE 1 TABLET EVERY DAY AS DIRECTED BY COUMADIN CLINIC 30 tablet 3   No current facility-administered medications for this visit.   No results found.  Review of Systems:   A ROS was performed including pertinent positives and negatives as documented in the HPI.  Physical Exam :   Constitutional: NAD and appears  stated age Neurological: Alert and oriented Psych: Appropriate affect and cooperative There were no vitals taken for this visit.   Comprehensive Musculoskeletal Exam:    Right hip with tenderness about the glenohumeral joint.  Pain with passive external rotation which is 60 compared to 65 on the contralateral side.  Forward elevation is 270 bilaterally with pain on the right.  Remainder of distal neurosensory exam is intact  Imaging:   Xray (3 views right shoulder): Normal   I personally reviewed and interpreted the radiographs.   Assessment:   52 y.o. female with evidence of right shoulder adhesive capsulitis in the setting of thyroid disease as well.  At today's visit I have recommended an ultrasound-guided injection of the glenohumeral joint.  Will plan to proceed with this and begin with a gentle stretching program which I counseled her on.  I will plan to see her back in 2 weeks for reassessment  Plan :    -Return to clinic in 2 weeks for reassessment    Procedure Note  Patient: [name]             Date of Birth: 1970-12-17           MRN: [id number]             Visit Date: 04/06/2023  Procedures: Visit Diagnoses:  1. Chronic right shoulder pain     Large Joint Inj: R glenohumeral on 04/06/2023 10:54 AM Indications: pain Details: 22 G 1.5 in needle, ultrasound-guided anterior approach  Arthrogram: No  Medications: 4 mL lidocaine 1 %; 80 mg triamcinolone acetonide 40 MG/ML Outcome: tolerated well, no immediate complications Procedure, treatment alternatives, risks and benefits explained, specific risks discussed. Consent was given by the patient. Immediately prior to procedure a time out was called to verify the correct patient, procedure, equipment, support staff and site/side marked as required. Patient was prepped and draped in the usual sterile fashion.        I personally saw and evaluated the patient, and participated in the management and treatment  plan.  Huel Cote, MD Attending Physician, Orthopedic Surgery  This document was dictated using Dragon voice recognition software. A reasonable attempt at proof reading has been made to minimize errors.

## 2023-04-06 NOTE — Patient Instructions (Signed)
Description   Today take 1 tablet of warfarin (missed Saturday dose) then continue taking 1 tablet daily except 1/2 tablet Monday, Wednesday, and Friday. Repeat INR in 1 week (6 weeks). Coumadin Coumadin [phone]

## 2023-04-16 ENCOUNTER — Ambulatory Visit: Payer: 59

## 2023-04-16 ENCOUNTER — Ambulatory Visit: Payer: 59 | Attending: Cardiology | Admitting: *Deleted

## 2023-04-16 DIAGNOSIS — Z952 Presence of prosthetic heart valve: Secondary | ICD-10-CM

## 2023-04-16 DIAGNOSIS — Z7901 Long term (current) use of anticoagulants: Secondary | ICD-10-CM | POA: Diagnosis not present

## 2023-04-16 DIAGNOSIS — I359 Nonrheumatic aortic valve disorder, unspecified: Secondary | ICD-10-CM | POA: Diagnosis not present

## 2023-04-16 LAB — POCT INR: INR: 1.5 — AB (ref 2.0–3.0)

## 2023-04-16 NOTE — Patient Instructions (Addendum)
Description   Continue taking 1 tablet daily except 1/2 tablet Monday, Wednesday, and Friday. Repeat INR in 5 weeks. Coumadin Coumadin [phone]

## 2023-04-20 ENCOUNTER — Ambulatory Visit (INDEPENDENT_AMBULATORY_CARE_PROVIDER_SITE_OTHER): Payer: 59 | Admitting: Orthopaedic Surgery

## 2023-04-20 DIAGNOSIS — M7501 Adhesive capsulitis of right shoulder: Secondary | ICD-10-CM

## 2023-04-20 NOTE — Progress Notes (Signed)
Chief Complaint: Right shoulder pain     History of Present Illness:   04/20/2023: Presents today for follow-up of the right shoulder.  This is doing very well after injection.  Regard to the left leg she states that she did have an episode where she began experiencing numbness and pain down the left posterior calf.  Denies any redness or swelling.  [name] is a 52 y.o. female right dominant female presents with ongoing right shoulder pain over the last several months that has been atraumatic in nature.  She is having pain when she reaches for objects.  She is experiencing this deep in the joint.  This has been waking her at night.  She has been seen by [facility] who did perform an injection 6 weeks prior    Surgical History:   none  PMH/PSH/Family History/Social History/Meds/Allergies:    Past Medical History:  Diagnosis Date   Femur fracture, left (HCC) 1990   with open reduction and internal fixation    H/O: rheumatic fever    Hepatitis B carrier (HCC)    positive hepatitis B carrier; maintained on Baraclude; followed by Hepatitis clinic.   History of allergic rhinitis    Severe aortic insufficiency 09/01/2004   s/p aortic valve replacement.     Past Surgical History:  Procedure Laterality Date   AORTIC VALVE REPLACEMENT  4/06   21mm On-X mechanical valve   CARDIAC CATHETERIZATION  11/15/2004   Ejection fraction is estimated 55%   HEMORRHOID SURGERY     Social History   Socioeconomic History   Marital status: Married    Spouse name: Not on file   Number of children: 3   Years of education: Not on file   Highest education level: Not on file  Occupational History   Occupation: Advertising account planner    Employer: [employer]  Tobacco Use   Smoking status: Never   Smokeless tobacco: Never  Vaping Use   Vaping status: Never Used  Substance and Sexual Activity   Alcohol use: No    Alcohol/week: 0.0 standard drinks of alcohol    Drug use: No   Sexual activity: Yes    Partners: Male  Other Topics Concern   Not on file  Social History Narrative   Marital status: married x 18 years; happily married; no abuse.  From [place]; [place] since 1993.      Children: 3 boys (17, 14, 66)      Lives: with husband, 3 sons      Employment:  Agricultural engineer at [employer].      Tobacco: none      Alcohol: none      Drugs: none      Exercise: walking x 3-4 days; 30 minutes   Social Determinants of Health   Financial Resource Strain: Not on file  Food Insecurity: Low Risk  (07/30/2022)   [facility]   Food vital sign    Within the past 12 months, you worried that your food would run out before you got money to buy more: Never true    Within the past 12 months, the food you bought just didn't last and you didn't have money to get more. : Never true  Transportation Needs: Not on file (07/30/2022)  Physical Activity: Not on file  Stress: Not on file  Social Connections: Not on file   Family History  Problem Relation Age of Onset   Heart disease Brother        valvular dysfunction; s/p replacement   Hypertension Mother    Hyperlipidemia Mother    Hypothyroidism Mother    Heart disease Father    Hypertension Father    Hyperlipidemia Father    Stomach cancer Maternal Grandmother    Colon cancer Neg Hx    Rectal cancer Neg Hx    Colon polyps Neg Hx    No Known Allergies Current Outpatient Medications  Medication Sig Dispense Refill   benzonatate (TESSALON PERLES) 100 MG capsule Take 1 capsule (100 mg total) by mouth 3 (three) times daily as needed for cough. 90 capsule 1   levothyroxine (SYNTHROID) 50 MCG tablet Take 1 tablet (50 mcg total) by mouth daily before breakfast. 30 tablet 0   triamcinolone ointment (KENALOG) 0.5 % APPLY TO DRY SKIN ON HANDS ON HANDS ONCE DAILY AS NEEDED 60 g 0   warfarin (COUMADIN) 5 MG tablet TAKE 1 TABLET EVERY DAY AS DIRECTED BY COUMADIN CLINIC 30 tablet 3   No  current facility-administered medications for this visit.   No results found.  Review of Systems:   A ROS was performed including pertinent positives and negatives as documented in the HPI.  Physical Exam :   Constitutional: NAD and appears stated age Neurological: Alert and oriented Psych: Appropriate affect and cooperative There were no vitals taken for this visit.   Comprehensive Musculoskeletal Exam:    Right hip with tenderness about the glenohumeral joint.  Pain with passive external rotation which is 60 compared to 65 on the contralateral side.  Forward elevation is 270 bilaterally with pain on the right.  Remainder of distal neurosensory exam is intact  Left calf with tenderness to palpation about the gastrocnemius medially and laterally.  No obvious neurological or strength deficits on the side.  Imaging:   Xray (3 views right shoulder): Normal   I personally reviewed and interpreted the radiographs.   Assessment:   52 y.o. female with evidence of right shoulder adhesive capsulitis in the setting of thyroid disease as well.  She is doing much better at today's visit.  This time would like to recommend physical therapy particularly for the left gastroc spasm to work on a good stretching program of the Achilles and gastroc as well as dry needling of the left gastroc.  Plan :    -Return to clinic as needed    I personally saw and evaluated the patient, and participated in the management and treatment plan.  Huel Cote, MD Attending Physician, Orthopedic Surgery  This document was dictated using Dragon voice recognition software. A reasonable attempt at proof reading has been made to minimize errors.

## 2023-04-21 ENCOUNTER — Other Ambulatory Visit: Payer: Self-pay | Admitting: Family Medicine

## 2023-04-21 DIAGNOSIS — E039 Hypothyroidism, unspecified: Secondary | ICD-10-CM

## 2023-04-22 ENCOUNTER — Ambulatory Visit (INDEPENDENT_AMBULATORY_CARE_PROVIDER_SITE_OTHER): Payer: 59 | Admitting: Physical Therapy

## 2023-04-22 ENCOUNTER — Other Ambulatory Visit: Payer: Self-pay

## 2023-04-22 ENCOUNTER — Encounter: Payer: Self-pay | Admitting: Physical Therapy

## 2023-04-22 DIAGNOSIS — R29898 Other symptoms and signs involving the musculoskeletal system: Secondary | ICD-10-CM | POA: Diagnosis not present

## 2023-04-22 DIAGNOSIS — M79662 Pain in left lower leg: Secondary | ICD-10-CM

## 2023-04-22 NOTE — Therapy (Addendum)
OUTPATIENT PHYSICAL THERAPY EVALUATION DISCHARGE SUMMARY   Patient Name: [name] MRN: [id number] DOB:08-09-1971, 52 y.o., female Today's Date: 04/22/2023  END OF SESSION:  PT End of Session - 04/22/23 0939     Visit Number 1    Number of Visits 6    Date for PT Re-Evaluation 06/03/23    Authorization Type [employer]; 30 visit limit    PT Start Time 0850    PT Stop Time 0920    PT Time Calculation (min) 30 min    Activity Tolerance Patient tolerated treatment well    Behavior During Therapy [facility] for tasks assessed/performed             Past Medical History:  Diagnosis Date   Femur fracture, left (HCC) 1990   with open reduction and internal fixation    H/O: rheumatic fever    Hepatitis B carrier (HCC)    positive hepatitis B carrier; maintained on Baraclude; followed by Hepatitis clinic.   History of allergic rhinitis    Severe aortic insufficiency 09/01/2004   s/p aortic valve replacement.     Past Surgical History:  Procedure Laterality Date   AORTIC VALVE REPLACEMENT  4/06   21mm On-X mechanical valve   CARDIAC CATHETERIZATION  11/15/2004   Ejection fraction is estimated 55%   HEMORRHOID SURGERY     Patient Active Problem List   Diagnosis Date Noted   Hypothyroidism, postradioiodine therapy 02/04/2017   Vasovagal syncope 05/02/2015   Hepatitis B 04/28/2012   Aortic valve disorder 02/11/2012   Long term (current) use of anticoagulants 02/11/2012   S/P AVR 12/16/2011   Chronic anticoagulation 12/16/2011   PULMONARY NODULE 01/31/2010   DYSPNEA 01/31/2010    PCP: Pearline Cables, MD   REFERRING PROVIDER: Huel Cote, MD   REFERRING DIAG: M75.01 (ICD-10-CM) - Adhesive capsulitis of right shoulder; M79.662 (ICD-10-CM) - Pain in left lower leg   Rationale for Evaluation and Treatment: Rehabilitation  THERAPY DIAG:  Pain in left lower leg - Plan: PT plan of care cert/re-cert  Other symptoms and signs involving the musculoskeletal system - Plan: PT  plan of care cert/re-cert  ONSET DATE: several months   SUBJECTIVE:                                                                                                                                                                                           SUBJECTIVE STATEMENT: Pt c/o 1 week hx of Lt calf pain without known injury.  She walks for about 40 min 5 days a week.  She states her shoulder is better.  PERTINENT HISTORY:  Lt femur fx; hx  rheumatic fever, aortic valve replacement  PAIN:  Are you having pain? Yes: NPRS scale: 5-6 currently, up to 8, at best 5/10 Pain location: Lt calf Pain description: sore Aggravating factors: knee extension machine at the gym Relieving factors: medication; aleve  PRECAUTIONS:  None  RED FLAGS: None   WEIGHT BEARING RESTRICTIONS:  No  FALLS:  Has patient fallen in last 6 months? No  LIVING ENVIRONMENT: Lives with: lives with their spouse Lives in: House/apartment Stairs:  denies difficulty with stairs; has about 7 steps at home  OCCUPATION:  Full time: Advertising account planner  PLOF:  Independent and Leisure: spend time with mother  PATIENT GOALS:  Improve pain  NEXT MD VISIT: PRN   OBJECTIVE:   DIAGNOSTIC FINDINGS:  none  PATIENT SURVEYS:  04/22/23: FOTO incorrectly set up  COGNITIVE STATUS: Within functional limits for tasks assessed   SENSATION: WFL  POSTURE:  rounded shoulders and forward head  HAND DOMINANCE:  Right  GAIT: 04/22/23 Comments: independent; no significant deviations noted  PALPATION: 04/22/23: trigger points in Lt gastroc/soleus  UPPER EXTREMITY ROM:  04/22/23: bil shoulder ROM WNL  LOWER EXTREMITY ROM:     ROM Right eval Left eval  Ankle dorsiflexion 5 A: 0 P: 12  Ankle plantarflexion 68 A: 68  Ankle inversion 18 A: 20 (Foot sits in ~10 deg inv)  Ankle eversion 15 A: 5 P: 20   (Blank rows = not tested)   LOWER EXTREMITY MMT:    MMT Left eval  Ankle dorsiflexion 5/5  Ankle  plantarflexion 4/5 with pain  Ankle inversion 4/5  Ankle eversion 4/5   (Blank rows = not tested)    TREATMENT:                                                                                                                              DATE:  04/22/23 TherEx See HEP - demonstrated exercises with trial reps performed for comprehension and technique  Manual STM with compression to Lt gastroc; skilled palpation and monitoring of soft tissue during DN Trigger Point Dry-Needling  Treatment instructions: Expect mild to moderate muscle soreness. S/S of pneumothorax if dry needled over a lung field, and to seek immediate medical attention should they occur. Patient verbalized understanding of these instructions and education.  Patient Consent Given: Yes Education handout provided: No (verbally reviewed; will provide next visit) Muscles treated: Lt gastroc Electrical stimulation performed: No Parameters: N/A Treatment response/outcome: twitch responses noted   PATIENT EDUCATION:  Education details: HEP, DN Person educated: Patient Education method: Explanation, Demonstration, and Handouts Education comprehension: verbalized understanding, returned demonstration, and needs further education  HOME EXERCISE PROGRAM: Access Code: [email or web address] URL: [email or web address] Date: 04/22/2023 Prepared by: Moshe Cipro  Exercises - Calf Mobilization with Small Ball  - 2 x daily - 7 x weekly - 1 sets - 1 reps - 2-3 min hold - Gastroc Stretch on Wall  - 2 x daily - 7 x weekly - 1  sets - 3 reps - 30 sec hold - Soleus Stretch on Wall  - 2 x daily - 7 x weekly - 1 sets - 3 reps - 30 sec hold   ASSESSMENT:  CLINICAL IMPRESSION: Patient is a 52 y.o. female who was seen today for physical therapy evaluation and treatment for Rt shoulder pain and Lt calf pain. She demonstrates decreased strength and mild ROM deficits, with continued pain affecting functional mobility.  She will  benefit from PT to address deficits listed.      OBJECTIVE IMPAIRMENTS: difficulty walking, decreased ROM, decreased strength, increased fascial restrictions, increased muscle spasms, impaired tone, and pain.   ACTIVITY LIMITATIONS: lifting, standing, squatting, and locomotion level  PARTICIPATION LIMITATIONS: shopping, community activity, and occupation  PERSONAL FACTORS: 3+ comorbidities: Lt femur fx; hx rheumatic fever, aortic valve replacement  are also affecting patient's functional outcome.   REHAB POTENTIAL: Good  CLINICAL DECISION MAKING: Evolving/moderate complexity  EVALUATION COMPLEXITY: Moderate   GOALS: Goals reviewed with patient? Yes  SHORT TERM GOALS: Target date: 05/13/2023  Independent with initial HEP Goal status: INITIAL   LONG TERM GOALS: Target date: 06/03/2023    Independent with final HEP Goal status: INITIAL  2.  Report pain < 2/10 with walking > 1 mile for improved function Goal status: INITIAL  3.  Lt ankle strength improved to 5/5 for improved function and mobility Goal status: INIITAL  4.  Lt ankle dorsiflexion improved to at least 5 deg active for improved function and mobility Goal status: INITIAL     PLAN:  PT FREQUENCY: 1x/week  PT DURATION: 6 weeks  PLANNED INTERVENTIONS: Therapeutic exercises, Therapeutic activity, Neuromuscular re-education, Gait training, Patient/Family education, Self Care, Joint mobilization, Aquatic Therapy, Dry Needling, Electrical stimulation, Cryotherapy, Moist heat, Taping, Vasopneumatic device, Ultrasound, Ionotophoresis 4mg /ml Dexamethasone, Manual therapy, and Re-evaluation.  PLAN FOR NEXT SESSION: review HEP, progress ankle strengthening   Clarita Crane, PT, DPT 04/22/23 9:42 AM     PHYSICAL THERAPY DISCHARGE SUMMARY  Visits from Start of Care: 1  Current functional level related to goals / functional outcomes: See above   Remaining deficits: See above   Education /  Equipment: HEP, DN   Patient agrees to discharge. Patient goals were not met. Patient is being discharged due to being pleased with the current functional level.  Clarita Crane, PT, DPT 05/06/23 10:53 AM  [facility] [address] [place], [place], [address] Phone: [phone]   Fax:  [phone]

## 2023-04-29 ENCOUNTER — Other Ambulatory Visit: Payer: Self-pay | Admitting: Family Medicine

## 2023-04-30 ENCOUNTER — Encounter: Payer: 59 | Admitting: Physical Therapy

## 2023-05-06 ENCOUNTER — Encounter: Payer: 59 | Admitting: Physical Therapy

## 2023-05-06 ENCOUNTER — Telehealth: Payer: Self-pay | Admitting: Physical Therapy

## 2023-05-06 NOTE — Telephone Encounter (Signed)
Pt stated she called to cancel her appts because she is feeling better.  Canceled remaining appts and will d/c PT.  Clarita Crane, PT, DPT 05/06/23 10:40 AM

## 2023-05-13 ENCOUNTER — Encounter: Payer: 59 | Admitting: Physical Therapy

## 2023-05-18 ENCOUNTER — Ambulatory Visit: Payer: 59 | Attending: Cardiology

## 2023-05-18 ENCOUNTER — Other Ambulatory Visit: Payer: 59

## 2023-05-18 DIAGNOSIS — I359 Nonrheumatic aortic valve disorder, unspecified: Secondary | ICD-10-CM

## 2023-05-18 DIAGNOSIS — Z7901 Long term (current) use of anticoagulants: Secondary | ICD-10-CM | POA: Diagnosis not present

## 2023-05-18 DIAGNOSIS — Z952 Presence of prosthetic heart valve: Secondary | ICD-10-CM | POA: Diagnosis not present

## 2023-05-18 LAB — POCT INR: INR: 2 (ref 2.0–3.0)

## 2023-05-18 NOTE — Patient Instructions (Signed)
Continue taking 1 tablet daily except 1/2 tablet Monday, Wednesday, and Friday. Repeat INR in 7 weeks. Coumadin Coumadin [phone]

## 2023-05-19 ENCOUNTER — Other Ambulatory Visit (INDEPENDENT_AMBULATORY_CARE_PROVIDER_SITE_OTHER): Payer: 59

## 2023-05-19 ENCOUNTER — Telehealth: Payer: Self-pay | Admitting: Family Medicine

## 2023-05-19 DIAGNOSIS — E039 Hypothyroidism, unspecified: Secondary | ICD-10-CM

## 2023-05-19 LAB — TSH: TSH: 24.12 u[IU]/mL — ABNORMAL HIGH (ref 0.35–5.50)

## 2023-05-19 NOTE — Telephone Encounter (Signed)
Called patient to go over her TSH Results for orders placed or performed in visit on 05/19/23  TSH  Result Value Ref Range   TSH 24.12 (H) 0.35 - 5.50 uIU/mL   In April her TSH was suppressed, I decreased her levothyroxine from 75-50.  We are surprised to see her TSH so dramatically elevated she has 75 mcg on hand still.  I asked her to please change back to 75 mcg and then have a TSH level drawn as a lab visit only in about 6 weeks.  She states understanding and agreement

## 2023-05-30 ENCOUNTER — Other Ambulatory Visit: Payer: Self-pay | Admitting: Family Medicine

## 2023-05-30 DIAGNOSIS — E039 Hypothyroidism, unspecified: Secondary | ICD-10-CM

## 2023-06-09 ENCOUNTER — Other Ambulatory Visit: Payer: Self-pay | Admitting: Family Medicine

## 2023-06-09 DIAGNOSIS — E039 Hypothyroidism, unspecified: Secondary | ICD-10-CM

## 2023-06-25 NOTE — Progress Notes (Signed)
[name] Date of Birth: Jan 16, 1971 Medical Record #[id number]  History of Present Illness: [name] is seen today for followup. She has a history of severe aortic insufficiency and underwent aortic valve replacement with a mechanical prosthesis in April of 2006. This was a #21 mm On-X valve. She does have some chronic mild dyspnea. She's had extensive pulmonary and cardiac evaluation for this which has been unremarkable. She is on chronic anticoagulation. She does have a history of hepatitis B and hyperthyroidism s/p radioactive iodine ablation. Last Echo in June 2022 showed normally functioning AV prosthesis.   On follow up today she is doing well.  She is active and is still working. She denies any chest pain or palpitations. Energy level is good. No significant  dyspnea.  Current Outpatient Medications on File Prior to Visit  Medication Sig Dispense Refill   benzonatate (TESSALON PERLES) 100 MG capsule Take 1 capsule (100 mg total) by mouth 3 (three) times daily as needed for cough. 90 capsule 1   levothyroxine (SYNTHROID) 75 MCG tablet TAKE 1 TABLET BY MOUTH EVERY DAY BEFORE BREAKFAST 30 tablet 11   triamcinolone ointment (KENALOG) 0.5 % APPLY TO DRY SKIN ON HANDS ON HANDS ONCE DAILY AS NEEDED 60 g 0   warfarin (COUMADIN) 5 MG tablet TAKE 1 TABLET EVERY DAY AS DIRECTED BY COUMADIN CLINIC 30 tablet 3   No current facility-administered medications on file prior to visit.    No Known Allergies  Past Medical History:  Diagnosis Date   Femur fracture, left (HCC) 1990   with open reduction and internal fixation    H/O: rheumatic fever    Hepatitis B carrier (HCC)    positive hepatitis B carrier; maintained on Baraclude; followed by Hepatitis clinic.   History of allergic rhinitis    Severe aortic insufficiency 09/01/2004   s/p aortic valve replacement.      Past Surgical History:  Procedure Laterality Date   AORTIC VALVE REPLACEMENT  4/06   21mm On-X mechanical valve   CARDIAC  CATHETERIZATION  11/15/2004   Ejection fraction is estimated 55%   HEMORRHOID SURGERY      Social History   Tobacco Use  Smoking Status Never  Smokeless Tobacco Never    Social History   Substance and Sexual Activity  Alcohol Use No   Alcohol/week: 0.0 standard drinks of alcohol    Family History  Problem Relation Age of Onset   Heart disease Brother        valvular dysfunction; s/p replacement   Hypertension Mother    Hyperlipidemia Mother    Hypothyroidism Mother    Heart disease Father    Hypertension Father    Hyperlipidemia Father    Stomach cancer Maternal Grandmother    Colon cancer Neg Hx    Rectal cancer Neg Hx    Colon polyps Neg Hx     Review of Systems: As noted in history of present illness.  All other systems were reviewed and are negative.  Physical Exam: BP 120/60 (BP Location: Left Arm, Patient Position: Sitting, Cuff Size: Normal)   Pulse 71   Ht 5\' 4"  (1.626 m)   Wt 128 lb (58.1 kg)   SpO2 97%   BMI 21.97 kg/m  GENERAL:  Well appearing Asian female in NAD HEENT:  PERRL, EOMI, sclera are clear. Oropharynx is clear. NECK:  No jugular venous distention, carotid upstroke brisk and symmetric, no bruits, no thyromegaly or adenopathy LUNGS:  Clear to auscultation bilaterally CHEST:  Unremarkable HEART:  RRR,  PMI not displaced or sustained, normal mechanical AV click, no S3, no S4: no clicks, no rubs, no murmurs ABD:  Soft, nontender. BS +, no masses or bruits. No hepatomegaly, no splenomegaly EXT:  2 + pulses throughout, no edema, no cyanosis no clubbing SKIN:  Warm and dry.  No rashes NEURO:  Alert and oriented x 3. Cranial nerves II through XII intact. PSYCH:  Cognitively intact    LABORATORY DATA:  Lab Results  Component Value Date   WBC 4.5 12/15/2022   HGB 13.1 12/15/2022   HCT 39.6 12/15/2022   PLT 268.0 12/15/2022   GLUCOSE 99 12/15/2022   CHOL 193 06/25/2022   TRIG 63.0 06/25/2022   HDL 55.30 06/25/2022   LDLCALC 125 (H)  06/25/2022   ALT 13 06/25/2022   AST 18 06/25/2022   NA 143 12/15/2022   K 3.9 12/15/2022   CL 107 12/15/2022   CREATININE 0.67 12/15/2022   BUN 14 12/15/2022   CO2 30 12/15/2022   TSH 24.12 (H) 05/19/2023   INR 2.0 07/06/2023   HGBA1C 5.8 06/25/2022   EKG Interpretation Date/Time:  Monday July 06 2023 11:29:02 EST Ventricular Rate:  71 PR Interval:  184 QRS Duration:  88 QT Interval:  412 QTC Calculation: 447 R Axis:   74  Text Interpretation: Sinus rhythm with marked sinus arrhythmia When compared with ECG of 17-May-2016 15:29, Nonspecific T wave abnormality, improved in Anterior leads Confirmed by [place], Ladanian Kelter [phone]) on 07/06/2023 11:32:28 AM    Echo 12/12/14:Study Conclusions  - Left ventricle: The cavity size was normal. Wall thickness was   normal. Systolic function was normal. The estimated ejection   fraction was in the range of 55% to 60%. Left ventricular   diastolic function parameters were normal. - Aortic valve: There was a mechanical aortic valve. No significant   stenosis. There was trivial regurgitation. Mean gradient (S): 11   mm Hg. - Mitral valve: There was mild regurgitation. - Right ventricle: The cavity size was normal. Systolic function   was normal. - Tricuspid valve: Peak RV-RA gradient (S): 22 mm Hg. - Pulmonary arteries: PA peak pressure: 25 mm Hg (S). - Inferior vena cava: The vessel was normal in size. The   respirophasic diameter changes were in the normal range (= 50%),   consistent with normal central venous pressure.  Impressions:  - Normal LV size with EF 55-60%. Normal diastolic function.c   Normally functioning mechanical aortic valve. Mild MR. Normal RV   size and systolic function.  Echo 02/13/21: IMPRESSIONS     1. Left ventricular ejection fraction, by estimation, is 70 to 75%. The  left ventricle has hyperdynamic function. The left ventricle has no  regional wall motion abnormalities. Left ventricular diastolic  parameters  are consistent with Grade I diastolic  dysfunction (impaired relaxation).   2. Right ventricular systolic function is normal. The right ventricular  size is normal.   3. The mitral valve is abnormal. Trivial mitral valve regurgitation.   4. The aortic valve has been repaired/replaced. Aortic valve  regurgitation is trivial. There is a 21 mm On-X mechanical valve present  in the aortic position. Procedure Date: 11/2004. Echo findings are  consistent with normal structure and function of the  aortic valve prosthesis. Aortic valve area, by VTI measures 1.33 cm.  Aortic valve mean gradient measures 10.0 mmHg. Aortic valve Vmax measures  2.20 m/s.   Assessment / Plan: 1. Status post mechanical aortic valve replacement for severe aortic insufficiency. Good  click on exam. Echo in June 2022 showed good valve function. Continue anticoagulation and routine SBE prophylaxis.    2.  Hepatitis B.  3. Graves disease s/p radioactive thyroid ablation. TSH recently elevated to 24.12. related to dosage reduction. Now back on 75 micrograms daily. Per PCP.   Follow up in one year.

## 2023-07-06 ENCOUNTER — Encounter: Payer: Self-pay | Admitting: Cardiology

## 2023-07-06 ENCOUNTER — Ambulatory Visit (INDEPENDENT_AMBULATORY_CARE_PROVIDER_SITE_OTHER): Payer: 59

## 2023-07-06 ENCOUNTER — Ambulatory Visit: Payer: 59 | Attending: Cardiology | Admitting: Cardiology

## 2023-07-06 VITALS — BP 120/60 | HR 71 | Ht 64.0 in | Wt 128.0 lb

## 2023-07-06 DIAGNOSIS — Z5181 Encounter for therapeutic drug level monitoring: Secondary | ICD-10-CM

## 2023-07-06 DIAGNOSIS — Z7901 Long term (current) use of anticoagulants: Secondary | ICD-10-CM | POA: Diagnosis not present

## 2023-07-06 DIAGNOSIS — Z952 Presence of prosthetic heart valve: Secondary | ICD-10-CM

## 2023-07-06 DIAGNOSIS — I359 Nonrheumatic aortic valve disorder, unspecified: Secondary | ICD-10-CM | POA: Diagnosis not present

## 2023-07-06 DIAGNOSIS — I351 Nonrheumatic aortic (valve) insufficiency: Secondary | ICD-10-CM | POA: Diagnosis not present

## 2023-07-06 LAB — POCT INR: INR: 2 (ref 2.0–3.0)

## 2023-07-06 NOTE — Patient Instructions (Signed)
Continue taking 1 tablet daily except 1/2 tablet Monday, Wednesday, and Friday. Repeat INR in 7 weeks. Coumadin Coumadin [phone]

## 2023-07-06 NOTE — Patient Instructions (Signed)
Medication Instructions:  Continue same medications *If you need a refill on your cardiac medications before your next appointment, please call your pharmacy*   Lab Work: None ordered   Testing/Procedures: None ordered   Follow-Up: At [facility], you and your health needs are our priority.  As part of our continuing mission to provide you with exceptional heart care, we have created designated Provider Care Teams.  These Care Teams include your primary Cardiologist (physician) and Advanced Practice Providers (APPs -  Physician Assistants and Nurse Practitioners) who all work together to provide you with the care you need, when you need it.  We recommend signing up for the patient portal called "MyChart".  Sign up information is provided on this After Visit Summary.  MyChart is used to connect with patients for Virtual Visits (Telemedicine).  Patients are able to view lab/test results, encounter notes, upcoming appointments, etc.  Non-urgent messages can be sent to your provider as well.   To learn more about what you can do with MyChart, go to [email or web address].    Your next appointment:  1 year  Call in July to schedule Nov appointment    Provider:  Dr.Jordan

## 2023-07-24 ENCOUNTER — Other Ambulatory Visit: Payer: Self-pay | Admitting: Cardiology

## 2023-08-10 ENCOUNTER — Other Ambulatory Visit: Payer: Self-pay | Admitting: Nurse Practitioner

## 2023-08-10 DIAGNOSIS — K760 Fatty (change of) liver, not elsewhere classified: Secondary | ICD-10-CM

## 2023-08-10 DIAGNOSIS — B181 Chronic viral hepatitis B without delta-agent: Secondary | ICD-10-CM

## 2023-08-14 ENCOUNTER — Ambulatory Visit (INDEPENDENT_AMBULATORY_CARE_PROVIDER_SITE_OTHER): Payer: 59 | Admitting: Orthopaedic Surgery

## 2023-08-14 DIAGNOSIS — M7501 Adhesive capsulitis of right shoulder: Secondary | ICD-10-CM | POA: Diagnosis not present

## 2023-08-14 MED ORDER — LIDOCAINE HCL 1 % IJ SOLN
4.0000 mL | INTRAMUSCULAR | Status: AC | PRN
Start: 2023-08-14 — End: 2023-08-14
  Administered 2023-08-14: 4 mL

## 2023-08-14 MED ORDER — TRIAMCINOLONE ACETONIDE 40 MG/ML IJ SUSP
80.0000 mg | INTRAMUSCULAR | Status: AC | PRN
Start: 2023-08-14 — End: 2023-08-14
  Administered 2023-08-14: 80 mg via INTRA_ARTICULAR

## 2023-08-14 NOTE — Progress Notes (Signed)
Chief Complaint: Right shoulder pain     History of Present Illness:   08/14/2023: Presents today for follow-up of the right shoulder.  She did get significant relief from previous injection.  She is requesting an additional injection.   [name] is a 52 y.o. female right dominant female presents with ongoing right shoulder pain over the last several months that has been atraumatic in nature.  She is having pain when she reaches for objects.  She is experiencing this deep in the joint.  This has been waking her at night.  She has been seen by [facility] who did perform an injection 6 weeks prior    Surgical History:   none  PMH/PSH/Family History/Social History/Meds/Allergies:    Past Medical History:  Diagnosis Date  . Femur fracture, left (HCC) 1990   with open reduction and internal fixation   . H/O: rheumatic fever   . Hepatitis B carrier (HCC)    positive hepatitis B carrier; maintained on Baraclude; followed by Hepatitis clinic.  Marland Kitchen History of allergic rhinitis   . Severe aortic insufficiency 09/01/2004   s/p aortic valve replacement.     Past Surgical History:  Procedure Laterality Date  . AORTIC VALVE REPLACEMENT  4/06   21mm On-X mechanical valve  . CARDIAC CATHETERIZATION  11/15/2004   Ejection fraction is estimated 55%  . HEMORRHOID SURGERY     Social History   Socioeconomic History  . Marital status: Married    Spouse name: Not on file  . Number of children: 3  . Years of education: Not on file  . Highest education level: Not on file  Occupational History  . Occupation: Architect: [employer]  Tobacco Use  . Smoking status: Never  . Smokeless tobacco: Never  Vaping Use  . Vaping status: Never Used  Substance and Sexual Activity  . Alcohol use: No    Alcohol/week: 0.0 standard drinks of alcohol  . Drug use: No  . Sexual activity: Yes    Partners: Male  Other Topics Concern  . Not on  file  Social History Narrative   Marital status: married x 18 years; happily married; no abuse.  From [place]; [place] since 1993.      Children: 3 boys (17, 58, 74)      Lives: with husband, 3 sons      Employment:  Agricultural engineer at [employer].      Tobacco: none      Alcohol: none      Drugs: none      Exercise: walking x 3-4 days; 30 minutes   Social Drivers of Corporate investment banker Strain: Not on file  Food Insecurity: Low Risk  (08/10/2023)   Received from [facility]   Hunger Vital Sign   . Worried About Programme researcher, broadcasting/film/video in the Last Year: Never true   . Ran Out of Food in the Last Year: Never true  Transportation Needs: No Transportation Needs (08/10/2023)   Received from [facility]   . In the past 12 months, has lack of reliable transportation kept you from medical appointments, meetings, work or from getting things needed for daily living? : No  Physical Activity: Not on file  Stress: Not on file  Social Connections: Not  on file   Family History  Problem Relation Age of Onset  . Heart disease Brother        valvular dysfunction; s/p replacement  . Hypertension Mother   . Hyperlipidemia Mother   . Hypothyroidism Mother   . Heart disease Father   . Hypertension Father   . Hyperlipidemia Father   . Stomach cancer Maternal Grandmother   . Colon cancer Neg Hx   . Rectal cancer Neg Hx   . Colon polyps Neg Hx    No Known Allergies Current Outpatient Medications  Medication Sig Dispense Refill  . benzonatate (TESSALON PERLES) 100 MG capsule Take 1 capsule (100 mg total) by mouth 3 (three) times daily as needed for cough. 90 capsule 1  . levothyroxine (SYNTHROID) 75 MCG tablet TAKE 1 TABLET BY MOUTH EVERY DAY BEFORE BREAKFAST 30 tablet 11  . triamcinolone ointment (KENALOG) 0.5 % APPLY TO DRY SKIN ON HANDS ON HANDS ONCE DAILY AS NEEDED 60 g 0  . warfarin (COUMADIN) 5 MG tablet TAKE 1/2 to 1 TABLET ONCE DAILY AS DIRECTED BY COUMADIN CLINIC 90 tablet 1    No current facility-administered medications for this visit.   No results found.  Review of Systems:   A ROS was performed including pertinent positives and negatives as documented in the HPI.  Physical Exam :   Constitutional: NAD and appears stated age Neurological: Alert and oriented Psych: Appropriate affect and cooperative There were no vitals taken for this visit.   Comprehensive Musculoskeletal Exam:    Right hip with tenderness about the glenohumeral joint.  Pain with passive external rotation which is 60 compared to 65 on the contralateral side.  Forward elevation is 270 bilaterally with pain on the right.  Remainder of distal neurosensory exam is intact  Left calf with tenderness to palpation about the gastrocnemius medially and laterally.  No obvious neurological or strength deficits on the side.  Imaging:   Xray (3 views right shoulder): Normal   I personally reviewed and interpreted the radiographs.   Assessment:   52 y.o. female with evidence of right shoulder adhesive capsulitis in the setting of thyroid disease as well.  I did recommend an additional ultrasound-guided injection after discussion at today's visit.  Overall he motion is much improved although she continues to have persistent pain.  Given the fact that she has now failed an injection I do believe an MRI would also be helpful in assessing if there is any type of underlying tearing.  Plan :    -Right shoulder ultrasound-guided injection provided after verbal consent obtained    Procedure Note  Patient: [name]             Date of Birth: 04-05-1971           MRN: [id number]             Visit Date: 08/14/2023  Procedures: Visit Diagnoses:  1. Adhesive capsulitis of right shoulder     Large Joint Inj: R glenohumeral on 08/14/2023 12:22 PM Indications: pain Details: 22 G 1.5 in needle, ultrasound-guided anterior approach  Arthrogram: No  Medications: 4 mL lidocaine 1 %; 80 mg  triamcinolone acetonide 40 MG/ML Outcome: tolerated well, no immediate complications Procedure, treatment alternatives, risks and benefits explained, specific risks discussed. Consent was given by the patient. Immediately prior to procedure a time out was called to verify the correct patient, procedure, equipment, support staff and site/side marked as required. Patient was prepped and draped in the usual  sterile fashion.         I personally saw and evaluated the patient, and participated in the management and treatment plan.  Huel Cote, MD Attending Physician, Orthopedic Surgery  This document was dictated using Dragon voice recognition software. A reasonable attempt at proof reading has been made to minimize errors.

## 2023-08-18 ENCOUNTER — Ambulatory Visit
Admission: RE | Admit: 2023-08-18 | Discharge: 2023-08-18 | Disposition: A | Discharge status: Home / Self Care | Payer: 59 | Source: Ambulatory Visit | Attending: Nurse Practitioner | Admitting: Nurse Practitioner

## 2023-08-18 DIAGNOSIS — B1910 Unspecified viral hepatitis B without hepatic coma: Secondary | ICD-10-CM | POA: Diagnosis not present

## 2023-08-18 DIAGNOSIS — B181 Chronic viral hepatitis B without delta-agent: Secondary | ICD-10-CM

## 2023-08-18 DIAGNOSIS — K760 Fatty (change of) liver, not elsewhere classified: Secondary | ICD-10-CM

## 2023-08-24 ENCOUNTER — Telehealth: Payer: Self-pay

## 2023-08-24 ENCOUNTER — Ambulatory Visit: Payer: 59 | Attending: Cardiology

## 2023-08-24 NOTE — Telephone Encounter (Signed)
Lpm to check INR 

## 2023-08-31 ENCOUNTER — Other Ambulatory Visit (INDEPENDENT_AMBULATORY_CARE_PROVIDER_SITE_OTHER): Payer: 59

## 2023-08-31 DIAGNOSIS — E039 Hypothyroidism, unspecified: Secondary | ICD-10-CM

## 2023-08-31 LAB — TSH: TSH: 2.77 u[IU]/mL (ref 0.35–5.50)

## 2023-09-01 ENCOUNTER — Other Ambulatory Visit: Payer: Self-pay

## 2023-09-01 DIAGNOSIS — E039 Hypothyroidism, unspecified: Secondary | ICD-10-CM

## 2023-09-01 MED ORDER — LEVOTHYROXINE SODIUM 75 MCG PO TABS
75.0000 ug | ORAL_TABLET | Freq: Every day | ORAL | 1 refills | Status: AC
Start: 2023-09-01 — End: ?

## 2023-09-07 ENCOUNTER — Ambulatory Visit: Payer: 59 | Admitting: Dermatology

## 2023-09-14 ENCOUNTER — Ambulatory Visit (HOSPITAL_BASED_OUTPATIENT_CLINIC_OR_DEPARTMENT_OTHER): Payer: 59 | Admitting: Orthopaedic Surgery

## 2023-09-14 ENCOUNTER — Telehealth (HOSPITAL_BASED_OUTPATIENT_CLINIC_OR_DEPARTMENT_OTHER): Payer: Self-pay | Admitting: Orthopaedic Surgery

## 2023-09-14 ENCOUNTER — Other Ambulatory Visit: Payer: 59

## 2023-09-14 NOTE — Telephone Encounter (Signed)
 Left a message for patient to call the office to sch after 2/3 for Review

## 2023-09-18 ENCOUNTER — Encounter (HOSPITAL_BASED_OUTPATIENT_CLINIC_OR_DEPARTMENT_OTHER): Payer: Self-pay | Admitting: Orthopaedic Surgery

## 2023-09-28 ENCOUNTER — Telehealth: Payer: Self-pay

## 2023-09-28 ENCOUNTER — Ambulatory Visit: Payer: BLUE CROSS/BLUE SHIELD | Attending: Cardiology

## 2023-09-28 DIAGNOSIS — I359 Nonrheumatic aortic valve disorder, unspecified: Secondary | ICD-10-CM

## 2023-09-28 DIAGNOSIS — Z7901 Long term (current) use of anticoagulants: Secondary | ICD-10-CM | POA: Diagnosis not present

## 2023-09-28 LAB — POCT INR: INR: 2.3 (ref 2.0–3.0)

## 2023-09-28 MED ORDER — AMOXICILLIN 500 MG PO CAPS: ORAL_CAPSULE | ORAL | 3 refills | Status: DC

## 2023-09-28 NOTE — Telephone Encounter (Signed)
Called patient left Dr.Jordan's advice on personal voice mail.Amoxicillin prescription sent to [facility] on [address].

## 2023-09-28 NOTE — Telephone Encounter (Addendum)
Pt came to coumadin clinic today and states she has an upcoming dental cleaning. Her dentist office made her aware our cardiology office needs to send in a prescription for amoxicillin. Will forward message to Dr [place] for prescription.

## 2023-09-28 NOTE — Patient Instructions (Signed)
Description   Eat a serving of greens today and continue taking 1 tablet daily except 1/2 tablet Monday, Wednesday, and Friday.  Repeat INR in 5 weeks.  Coumadin Coumadin [phone]

## 2023-09-29 ENCOUNTER — Encounter (HOSPITAL_BASED_OUTPATIENT_CLINIC_OR_DEPARTMENT_OTHER): Payer: Self-pay | Admitting: Orthopaedic Surgery

## 2023-10-05 ENCOUNTER — Ambulatory Visit
Admission: RE | Admit: 2023-10-05 | Discharge: 2023-10-05 | Disposition: A | Discharge status: Home / Self Care | Payer: BLUE CROSS/BLUE SHIELD | Source: Ambulatory Visit | Attending: Orthopaedic Surgery | Admitting: Orthopaedic Surgery

## 2023-10-05 DIAGNOSIS — M7551 Bursitis of right shoulder: Secondary | ICD-10-CM | POA: Diagnosis not present

## 2023-10-05 DIAGNOSIS — M67813 Other specified disorders of tendon, right shoulder: Secondary | ICD-10-CM | POA: Diagnosis not present

## 2023-10-05 DIAGNOSIS — M7501 Adhesive capsulitis of right shoulder: Secondary | ICD-10-CM

## 2023-10-05 DIAGNOSIS — M7521 Bicipital tendinitis, right shoulder: Secondary | ICD-10-CM | POA: Diagnosis not present

## 2023-10-06 ENCOUNTER — Other Ambulatory Visit: Payer: Self-pay | Admitting: Family Medicine

## 2023-10-06 DIAGNOSIS — E039 Hypothyroidism, unspecified: Secondary | ICD-10-CM

## 2023-10-26 DIAGNOSIS — G5762 Lesion of plantar nerve, left lower limb: Secondary | ICD-10-CM | POA: Diagnosis not present

## 2023-10-26 DIAGNOSIS — M79672 Pain in left foot: Secondary | ICD-10-CM | POA: Diagnosis not present

## 2023-10-26 DIAGNOSIS — M21612 Bunion of left foot: Secondary | ICD-10-CM | POA: Diagnosis not present

## 2023-11-02 ENCOUNTER — Ambulatory Visit: Payer: BC Managed Care – PPO | Attending: Cardiology

## 2023-11-02 DIAGNOSIS — Z7901 Long term (current) use of anticoagulants: Secondary | ICD-10-CM | POA: Diagnosis not present

## 2023-11-02 DIAGNOSIS — Z952 Presence of prosthetic heart valve: Secondary | ICD-10-CM | POA: Diagnosis not present

## 2023-11-02 DIAGNOSIS — H2513 Age-related nuclear cataract, bilateral: Secondary | ICD-10-CM | POA: Diagnosis not present

## 2023-11-02 DIAGNOSIS — I359 Nonrheumatic aortic valve disorder, unspecified: Secondary | ICD-10-CM

## 2023-11-02 DIAGNOSIS — H40033 Anatomical narrow angle, bilateral: Secondary | ICD-10-CM | POA: Diagnosis not present

## 2023-11-02 LAB — POCT INR: INR: 2.1 (ref 2.0–3.0)

## 2023-11-02 NOTE — Patient Instructions (Signed)
 Eat a serving of greens today and continue taking 1 tablet daily except 1/2 tablet Monday, Wednesday, and Friday.  Repeat INR in 6 weeks.  Coumadin Coumadin [phone]

## 2023-11-24 ENCOUNTER — Encounter: Payer: Self-pay | Admitting: Family Medicine

## 2023-11-24 ENCOUNTER — Ambulatory Visit (INDEPENDENT_AMBULATORY_CARE_PROVIDER_SITE_OTHER): Admitting: Family Medicine

## 2023-11-24 VITALS — BP 110/80 | HR 73 | Temp 98.8°F | Ht 64.0 in | Wt 126.8 lb

## 2023-11-24 DIAGNOSIS — B349 Viral infection, unspecified: Secondary | ICD-10-CM | POA: Diagnosis not present

## 2023-11-24 DIAGNOSIS — R062 Wheezing: Secondary | ICD-10-CM

## 2023-11-24 DIAGNOSIS — R052 Subacute cough: Secondary | ICD-10-CM

## 2023-11-24 DIAGNOSIS — R051 Acute cough: Secondary | ICD-10-CM

## 2023-11-24 LAB — POCT INFLUENZA A/B
Influenza A, POC: NEGATIVE
Influenza B, POC: NEGATIVE

## 2023-11-24 LAB — POC COVID19 BINAXNOW: SARS Coronavirus 2 Ag: NEGATIVE

## 2023-11-24 MED ORDER — ALBUTEROL SULFATE HFA 108 (90 BASE) MCG/ACT IN AERS
2.0000 | INHALATION_SPRAY | Freq: Four times a day (QID) | RESPIRATORY_TRACT | 0 refills | Status: AC | PRN
Start: 2023-11-24 — End: ?

## 2023-11-24 MED ORDER — HYDROCODONE BIT-HOMATROP MBR 5-1.5 MG/5ML PO SOLN: 5.0000 mL | Freq: Four times a day (QID) | ORAL | 0 refills | Status: DC | PRN

## 2023-11-24 MED ORDER — BENZONATATE 100 MG PO CAPS
100.0000 mg | ORAL_CAPSULE | Freq: Three times a day (TID) | ORAL | 1 refills | Status: DC | PRN
Start: 2023-11-24 — End: 2024-02-11

## 2023-11-24 NOTE — Progress Notes (Unsigned)
   Acute Office Visit  Subjective:     Patient ID: [name], female    DOB: 08-03-1971, 53 y.o.   MRN: [id number]  No chief complaint on file.   HPI Patient is in today for evaluation of cough, sore throat, wheezing, for the last 3 days. Has tried Nyquil with little relief. Denies known sick contacts. Denies abdominal pain, nausea, vomiting, diarrhea, rash, fever, chills, other symptoms.  Medical hx as outlined below.  ROS Per HPI      Objective:    There were no vitals taken for this visit.   Physical Exam Vitals and nursing note reviewed.  Constitutional:      General: She is not in acute distress. HENT:     Head: Normocephalic and atraumatic.     Nose: Congestion present.     Mouth/Throat:     Mouth: Mucous membranes are moist.     Pharynx: Oropharynx is clear. No oropharyngeal exudate or posterior oropharyngeal erythema.     Comments: Oropharyngeal cobblestoning   Eyes:     Extraocular Movements: Extraocular movements intact.  Cardiovascular:     Rate and Rhythm: Normal rate and regular rhythm.  Pulmonary:     Effort: Pulmonary effort is normal. No respiratory distress.     Breath sounds: Examination of the right-lower field reveals wheezing. Examination of the left-lower field reveals wheezing. Wheezing present. No rhonchi or rales.  Musculoskeletal:     Cervical back: Normal range of motion and neck supple.  Lymphadenopathy:     Cervical: No cervical adenopathy.  Skin:    General: Skin is warm and dry.  Neurological:     Mental Status: She is alert.    No results found for any visits on 11/24/23.      Assessment & Plan:   There are no diagnoses linked to this encounter.   No orders of the defined types were placed in this encounter.   No follow-ups on file.  Moshe Cipro, FNP

## 2023-11-24 NOTE — Patient Instructions (Addendum)
 I have sent in an albuterol inhaler for you to use 2 puffs every 4 hours as needed for wheezing.  I have sent in tessalon perles for you to take 200mg  three times per day as needed for cough. This medication should not make you sleepy. Be sure to drink plenty of fluids with this medication.  I have sent in hydrocodone cough syrup for you to take 5 mL once daily in the evening as needed for cough.  This medication may make you sleepy.  Do not drive or operate heavy machinery while taking this medication.  Flu and COVID are negative.   Let me know if you are not feeling better by Friday, then it may be time for antibioitic

## 2023-11-25 ENCOUNTER — Encounter: Payer: Self-pay | Admitting: Family Medicine

## 2023-11-26 ENCOUNTER — Other Ambulatory Visit: Payer: Self-pay | Admitting: Family Medicine

## 2023-11-26 DIAGNOSIS — L301 Dyshidrosis [pompholyx]: Secondary | ICD-10-CM

## 2023-11-27 ENCOUNTER — Telehealth: Payer: Self-pay | Admitting: *Deleted

## 2023-11-27 ENCOUNTER — Other Ambulatory Visit: Payer: Self-pay | Admitting: Family Medicine

## 2023-11-27 ENCOUNTER — Ambulatory Visit: Payer: Self-pay

## 2023-11-27 DIAGNOSIS — B9689 Other specified bacterial agents as the cause of diseases classified elsewhere: Secondary | ICD-10-CM

## 2023-11-27 MED ORDER — AMOXICILLIN-POT CLAVULANATE 875-125 MG PO TABS: 1.0000 | ORAL_TABLET | Freq: Two times a day (BID) | ORAL | 0 refills | Status: AC

## 2023-11-27 NOTE — Telephone Encounter (Signed)
 Pt saw Judeth Cornfield at [facility]- will defer to her.

## 2023-11-27 NOTE — Telephone Encounter (Signed)
  Chief Complaint: worsening cough Symptoms: green phlegm form coughing, and blood noted Frequency: 5 days  Pertinent Negatives: Patient denies fever, wheezing, SOB Disposition: [] ED /[] Urgent Care (no appt availability in office) / [] Appointment(In office/virtual)/ []  [place] [facility]/ [] Home Care/ [] Refused Recommended Disposition /[]  Mobile Bus/ [x]  Follow-up with PCP Additional Notes: pt was asked to call back to get abx if worsening. Pt green phlegm with blood noted.  Pt stated that she was advised by cardiologist to take amoxicilling due to potential of interaction with blood thinner.  Copied from CRM [id number]. Topic: Clinical - Red Word Triage >> Nov 27, 2023  8:34 AM Mackie Pai E wrote: [facility] that prompted transfer to Nurse Triage: Worsening cough. Patient was seen 3/25 for these issues and they have not gotten better. Patient's cough has been increasing and she has a sore throat. Symptoms have been going on for 5 days. Reason for Disposition  [1] Coughed up blood-tinged sputum AND [2] more than once  Answer Assessment - Initial Assessment Questions 1. ONSET: "When did the cough begin?"      5 days  2. SEVERITY: "How bad is the cough today?"      Frequent in am  3. SPUTUM: "Describe the color of your sputum" (none, dry cough; clear, white, yellow, green)     Green  4. HEMOPTYSIS: "Are you coughing up any blood?" If so ask: "How much?" (flecks, streaks, tablespoons, etc.)     yes 5. DIFFICULTY BREATHING: "Are you having difficulty breathing?" If Yes, ask: "How bad is it?" (e.g., mild, moderate, severe)    - MILD: No SOB at rest, mild SOB with walking, speaks normally in sentences, can lie down, no retractions, pulse < 100.    - MODERATE: SOB at rest, SOB with minimal exertion and prefers to sit, cannot lie down flat, speaks in phrases, mild retractions, audible wheezing, pulse 100-120.    - SEVERE: Very SOB at rest, speaks in single words, struggling to breathe,  sitting hunched forward, retractions, pulse > 120      no 6. FEVER: "Do you have a fever?" If Yes, ask: "What is your temperature, how was it measured, and when did it start?"     no  10. OTHER SYMPTOMS: "Do you have any other symptoms?" (e.g., runny nose, wheezing, chest pain)       Sore throat  Protocols used: Cough - Acute Non-Productive-A-AH

## 2023-11-27 NOTE — Telephone Encounter (Signed)
 Received message from PCP that pt is being started on Augmentin. Replied to message that it is safe to take with warfarin.  Called pt to let her know as well and had to leave a message.

## 2023-11-27 NOTE — Progress Notes (Signed)
 Pt's warfarin is managed by cardiology coumadin clinic. Forwarding to their pool.

## 2023-12-09 ENCOUNTER — Ambulatory Visit: Attending: Cardiology | Admitting: *Deleted

## 2023-12-09 ENCOUNTER — Ambulatory Visit

## 2023-12-09 DIAGNOSIS — Z7901 Long term (current) use of anticoagulants: Secondary | ICD-10-CM | POA: Diagnosis not present

## 2023-12-09 DIAGNOSIS — Z952 Presence of prosthetic heart valve: Secondary | ICD-10-CM

## 2023-12-09 DIAGNOSIS — I359 Nonrheumatic aortic valve disorder, unspecified: Secondary | ICD-10-CM

## 2023-12-09 LAB — POCT INR: INR: 1.5 — AB (ref 2.0–3.0)

## 2023-12-09 NOTE — Patient Instructions (Signed)
 Description   Today take 1 tablet of warfarin then continue taking 1 tablet daily except 1/2 tablet Monday, Wednesday, and Friday.  Repeat INR in 6 weeks.  Coumadin Coumadin [phone]

## 2023-12-14 ENCOUNTER — Ambulatory Visit

## 2023-12-22 ENCOUNTER — Telehealth: Payer: Self-pay

## 2023-12-22 NOTE — Telephone Encounter (Signed)
 Copied from CRM [id number]. Topic: General - Other >> Dec 22, 2023  3:57 PM [name] wrote: Reason for CRM: patient is needing a call back to see when she needs to come in and get her thyroid  levels checked

## 2023-12-23 NOTE — Telephone Encounter (Signed)
 Last check was on 08/31/23- tsh was stable, please advise.

## 2023-12-24 NOTE — Telephone Encounter (Signed)
 Patient would like to come in and see Dr. Geralyn Knee.  Appt made for 02/03/24

## 2024-01-14 ENCOUNTER — Other Ambulatory Visit: Payer: Self-pay | Admitting: Cardiology

## 2024-01-14 DIAGNOSIS — Z952 Presence of prosthetic heart valve: Secondary | ICD-10-CM

## 2024-01-14 NOTE — Telephone Encounter (Signed)
 Prescription refill request received for warfarin Lov: 07/06/23 ([place])  Next INR check: 01/18/24 Warfarin tablet strength: 5mg   Appropriate dose. Refill sent.

## 2024-01-18 ENCOUNTER — Ambulatory Visit

## 2024-01-26 ENCOUNTER — Ambulatory Visit

## 2024-01-29 ENCOUNTER — Ambulatory Visit: Attending: Cardiology

## 2024-01-29 DIAGNOSIS — Z952 Presence of prosthetic heart valve: Secondary | ICD-10-CM

## 2024-01-29 DIAGNOSIS — I359 Nonrheumatic aortic valve disorder, unspecified: Secondary | ICD-10-CM | POA: Diagnosis not present

## 2024-01-29 DIAGNOSIS — Z7901 Long term (current) use of anticoagulants: Secondary | ICD-10-CM

## 2024-01-29 LAB — POCT INR: INR: 1.3 — AB (ref 2.0–3.0)

## 2024-01-29 NOTE — Patient Instructions (Signed)
 Today take 1 tablet of warfarin then continue taking 1 tablet daily except 1/2 tablet Monday, Wednesday, and Friday.  Repeat INR in 4 weeks.  Coumadin  Coumadin  [phone]

## 2024-02-03 ENCOUNTER — Ambulatory Visit: Admitting: Family Medicine

## 2024-02-04 NOTE — Patient Instructions (Incomplete)
 It was great to see you again today-I will be in touch with your labs as soon as possible

## 2024-02-04 NOTE — Progress Notes (Deleted)
 [facility] [address], [address] [place], [place] [address] [phone]  Date:  02/08/2024   Name:  [name]   DOB:  February 12, 1971   MRN:  [id number]  PCP:  Kaylee Partridge, MD    Chief Complaint: No chief complaint on file.   History of Present Illness:  [name] is a 53 y.o. very pleasant female patient who presents with the following:  Patient seen today for periodic follow-up.  Most recent visit with myself was in April 2024  History of post radiation hypothyroidism, hepatitis B, aortic valve disorder due to rheumatic fever with valve replacement on Coumadin , vitamin D  deficiency Her anticoagulation is managed by Coumadin  clinic-she follows up regularly, most recent visit May 30 Hematologist for iron deficiency anemia is Dr. Felipe Horton with [facility] Her [facility]-most recent follow-up was in December 2024  She was seen by cardiology, Dr. [place] in November 2024: Assessment / Plan: 1. Status post mechanical aortic valve replacement for severe aortic insufficiency. Good click on exam. Echo in June 2022 showed good valve function. Continue anticoagulation and routine SBE prophylaxis.   2.  Hepatitis B. 3. Graves disease s/p radioactive thyroid  ablation. TSH recently elevated to 24.12. related to dosage reduction. Now back on 75 micrograms daily. Per PCP.  Her gynecologist is Dr. Mills Alma  Second of Shingrix Can give pneumonia vaccine today-indicated due to aortic valve replacement Mammogram Pap is up-to-date Colon cancer screening up-to-date  [facility] did some lab work for her in December-LFTs.  Otherwise labs need update  Patient Active Problem List   Diagnosis Date Noted   Hypothyroidism, postradioiodine therapy 02/04/2017   Vasovagal syncope 05/02/2015   Hepatitis B 04/28/2012   Aortic valve disorder 02/11/2012   Long term (current) use of anticoagulants 02/11/2012    S/P AVR 12/16/2011   Chronic anticoagulation 12/16/2011   PULMONARY NODULE 01/31/2010   DYSPNEA 01/31/2010    Past Medical History:  Diagnosis Date   Femur fracture, left (HCC) 1990   with open reduction and internal fixation    H/O: rheumatic fever    Hepatitis B carrier (HCC)    positive hepatitis B carrier; maintained on Baraclude ; followed by Hepatitis clinic.   History of allergic rhinitis    Severe aortic insufficiency 09/01/2004   s/p aortic valve replacement.      Past Surgical History:  Procedure Laterality Date   AORTIC VALVE REPLACEMENT  4/06   21mm On-X mechanical valve   CARDIAC CATHETERIZATION  11/15/2004   Ejection fraction is estimated 55%   HEMORRHOID SURGERY      Social History   Tobacco Use   Smoking status: Never   Smokeless tobacco: Never  Vaping Use   Vaping status: Never Used  Substance Use Topics   Alcohol use: No    Alcohol/week: 0.0 standard drinks of alcohol   Drug use: No    Family History  Problem Relation Age of Onset   Heart disease Brother        valvular dysfunction; s/p replacement   Hypertension Mother    Hyperlipidemia Mother    Hypothyroidism Mother    Heart disease Father    Hypertension Father    Hyperlipidemia Father    Stomach cancer Maternal Grandmother    Colon cancer Neg Hx    Rectal cancer Neg Hx    Colon polyps Neg Hx     No Known Allergies  Medication list has been reviewed and updated.  Current Outpatient Medications on File Prior to Visit  Medication Sig Dispense Refill   albuterol  (VENTOLIN  HFA) 108 (90 Base) MCG/ACT inhaler Inhale 2 puffs into the lungs every 6 (six) hours as needed for wheezing or shortness of breath. 9 each 0   benzonatate  (TESSALON  PERLES) 100 MG capsule Take 1 capsule (100 mg total) by mouth 3 (three) times daily as needed for cough. 90 capsule 1   HYDROcodone  bit-homatropine (HYCODAN) 5-1.5 MG/5ML syrup Take 5 mLs by mouth every 6 (six) hours as needed for cough. 120 mL 0    levothyroxine  (SYNTHROID ) 75 MCG tablet Take 1 tablet (75 mcg total) by mouth daily before breakfast. 90 tablet 1   triamcinolone  ointment (KENALOG ) 0.5 % APPLY TO DRY SKIN ON HANDS ON HANDS ONCE DAILY AS NEEDED 60 g 0   warfarin (COUMADIN ) 5 MG tablet TAKE 1/2 TO 1 TABLET ONCE DAILY AS DIRECTED BY COUMADIN  CLINIC 75 tablet 0   No current facility-administered medications on file prior to visit.    Review of Systems:  As per HPI- otherwise negative.   Physical Examination: There were no vitals filed for this visit. There were no vitals filed for this visit. There is no height or weight on file to calculate BMI. Ideal Body Weight:    GEN: no acute distress. HEENT: Atraumatic, Normocephalic.  Ears and Nose: No external deformity. CV: RRR, No M/G/R. No JVD. No thrill. No extra heart sounds. PULM: CTA B, no wheezes, crackles, rhonchi. No retractions. No resp. distress. No accessory muscle use. ABD: S, NT, ND, +BS. No rebound. No HSM. EXTR: No c/c/e PSYCH: Normally interactive. Conversant.    Assessment and Plan: ***  Signed Gates Kasal, MD

## 2024-02-07 NOTE — Progress Notes (Addendum)
 [facility] at [facility] [address], [address] [place], [place] [address] [phone]  Date:  02/11/2024   Name:  [name]   DOB:  Dec 08, 1970   MRN:  [id number]  PCP:  Kaylee Partridge, MD    Chief Complaint: No chief complaint on file.   History of Present Illness:  [name] is a 53 y.o. very pleasant female patient who presents with the following:  Patient seen today for periodic follow-up.  Most recent visit with myself was in April 2024  History of post radiation hypothyroidism, hepatitis B, aortic valve disorder due to rheumatic fever with valve replacement on Coumadin , vitamin D  deficiency, borderline prediabetes Her anticoagulation is managed by Coumadin  clinic-she follows up regularly, most recent visit May 30 Hematologist for iron deficiency anemia is Dr. Felipe Horton with [facility] Her hepatitis B is managed [facility]-most recent follow-up was in December 2024  She was seen by cardiology, Dr. [place] in November 2024: Assessment / Plan: 1. Status post mechanical aortic valve replacement for severe aortic insufficiency. Good click on exam. Echo in June 2022 showed good valve function. Continue anticoagulation and routine SBE prophylaxis.   2.  Hepatitis B. 3. Graves disease s/p radioactive thyroid  ablation. TSH recently elevated to 24.12. related to dosage reduction. Now back on 75 micrograms daily. Per PCP.  Her gynecologist is Dr. Mills Alma  Second dose of Shingrix- pt notes done at pharmacy  Can give pneumonia vaccine today-indicated due to aortic valve replacement, however [facility] notes [facility]  Mammogram Pap is up-to-date Colon cancer screening up-to-date  [facility] did some lab work for her in December-LFTs.  Otherwise labs need update  Patient Active Problem List   Diagnosis Date Noted   Hypothyroidism, postradioiodine therapy 02/04/2017   Vasovagal syncope 05/02/2015   Hepatitis B 04/28/2012    Aortic valve disorder 02/11/2012   Long term (current) use of anticoagulants 02/11/2012   S/P AVR 12/16/2011   Chronic anticoagulation 12/16/2011   PULMONARY NODULE 01/31/2010   DYSPNEA 01/31/2010    Past Medical History:  Diagnosis Date   Femur fracture, left (HCC) 1990   with open reduction and internal fixation    H/O: rheumatic fever    Hepatitis B carrier (HCC)    positive hepatitis B carrier; maintained on Baraclude ; followed by Hepatitis clinic.   History of allergic rhinitis    Severe aortic insufficiency 09/01/2004   s/p aortic valve replacement.      Past Surgical History:  Procedure Laterality Date   AORTIC VALVE REPLACEMENT  4/06   21mm On-X mechanical valve   CARDIAC CATHETERIZATION  11/15/2004   Ejection fraction is estimated 55%   HEMORRHOID SURGERY      Social History   Tobacco Use   Smoking status: Never   Smokeless tobacco: Never  Vaping Use   Vaping status: Never Used  Substance Use Topics   Alcohol use: No    Alcohol/week: 0.0 standard drinks of alcohol   Drug use: No    Family History  Problem Relation Age of Onset   Heart disease Brother        valvular dysfunction; s/p replacement   Hypertension Mother    Hyperlipidemia Mother    Hypothyroidism Mother    Heart disease Father    Hypertension Father    Hyperlipidemia Father    Stomach cancer Maternal Grandmother    Colon cancer Neg Hx    Rectal cancer Neg  Hx    Colon polyps Neg Hx     No Known Allergies  Medication list has been reviewed and updated.  Current Outpatient Medications on File Prior to Visit  Medication Sig Dispense Refill   albuterol  (VENTOLIN  HFA) 108 (90 Base) MCG/ACT inhaler Inhale 2 puffs into the lungs every 6 (six) hours as needed for wheezing or shortness of breath. 9 each 0   benzonatate  (TESSALON  PERLES) 100 MG capsule Take 1 capsule (100 mg total) by mouth 3 (three) times daily as needed for cough. 90 capsule 1   HYDROcodone  bit-homatropine (HYCODAN) 5-1.5  MG/5ML syrup Take 5 mLs by mouth every 6 (six) hours as needed for cough. 120 mL 0   levothyroxine  (SYNTHROID ) 75 MCG tablet Take 1 tablet (75 mcg total) by mouth daily before breakfast. 90 tablet 1   triamcinolone  ointment (KENALOG ) 0.5 % APPLY TO DRY SKIN ON HANDS ON HANDS ONCE DAILY AS NEEDED 60 g 0   warfarin (COUMADIN ) 5 MG tablet TAKE 1/2 TO 1 TABLET ONCE DAILY AS DIRECTED BY COUMADIN  CLINIC 75 tablet 0   No current facility-administered medications on file prior to visit.    Review of Systems:  As per HPI- otherwise negative.   Physical Examination: Vitals:   02/11/24 0845  BP: 122/62  Pulse: 64  Resp: 16  Temp: 98.4 F (36.9 C)  SpO2: 98%   Vitals:   02/11/24 0845  Weight: 124 lb (56.2 kg)  Height: 5' 4 (1.626 m)   Body mass index is 21.28 kg/m. Ideal Body Weight: Weight in (lb) to have BMI = 25: 145.3  GEN: no acute distress. Normal weight, looks well  HEENT: Atraumatic, Normocephalic.  Ears and Nose: No external deformity. CV: RRR, No M/G/R. No JVD. No thrill. No extra heart sounds. PULM: CTA B, no wheezes, crackles, rhonchi. No retractions. No resp. distress. No accessory muscle use. ABD: S, NT, ND. No rebound. No HSM. EXTR: No c/c/e PSYCH: Normally interactive. Conversant.    Assessment and Plan: Acquired hypothyroidism - Plan: TSH  Screening for hyperlipidemia - Plan: Lipid panel  Screening for diabetes mellitus - Plan: Comprehensive metabolic panel with GFR, Hemoglobin A1c  Screening for deficiency anemia - Plan: CBC  Acute cough - Plan: benzonatate  (TESSALON  PERLES) 100 MG capsule  Pt seen today for a recheck visit She would like a RF of her tessalon  that she likes to keep on hand  Great to see you today- I will be in touch with your labs Please see me in about 6 months assuming all is well  Please call and set up your mammogram We will call [facility] and update your vaccines for you    Signed Gates Kasal, MD  Addendum 6/13,  received labs as below.  Letter to patient Results for orders placed or performed in visit on 02/11/24  CBC   Collection Time: 02/11/24  9:06 AM  Result Value Ref Range   WBC 5.0 3.8 - 10.8 Thousand/uL   RBC 4.54 3.80 - 5.10 Million/uL   Hemoglobin 13.1 11.7 - 15.5 g/dL   HCT 16.1 09.6 - 04.5 %   MCV 91.0 80.0 - 100.0 fL   MCH 28.9 27.0 - 33.0 pg   MCHC 31.7 (L) 32.0 - 36.0 g/dL   RDW 40.9 81.1 - 91.4 %   Platelets 231 140 - 400 Thousand/uL   MPV 10.9 7.5 - 12.5 fL  Comprehensive metabolic panel with GFR   Collection Time: 02/11/24  9:06 AM  Result Value Ref Range  Glucose, Bld 89 65 - 99 mg/dL   BUN 14 7 - 25 mg/dL   Creat 0.86 5.78 - 4.69 mg/dL   eGFR 629 > OR = 60 BM/WUX/3.24M0   BUN/Creatinine Ratio SEE NOTE: 6 - 22 (calc)   Sodium 139 135 - 146 mmol/L   Potassium 4.1 3.5 - 5.3 mmol/L   Chloride 106 98 - 110 mmol/L   CO2 27 20 - 32 mmol/L   Calcium 9.5 8.6 - 10.4 mg/dL   Total Protein 7.4 6.1 - 8.1 g/dL   Albumin 4.2 3.6 - 5.1 g/dL   Globulin 3.2 1.9 - 3.7 g/dL (calc)   AG Ratio 1.3 1.0 - 2.5 (calc)   Total Bilirubin 0.7 0.2 - 1.2 mg/dL   Alkaline phosphatase (APISO) 53 37 - 153 U/L   AST 21 10 - 35 U/L   ALT 17 6 - 29 U/L  Hemoglobin A1c   Collection Time: 02/11/24  9:06 AM  Result Value Ref Range   Hgb A1c MFr Bld 5.7 (H) <5.7 %   Mean Plasma Glucose 117 mg/dL   eAG (mmol/L) 6.5 mmol/L  Lipid panel   Collection Time: 02/11/24  9:06 AM  Result Value Ref Range   Cholesterol 180 <200 mg/dL   HDL 63 > OR = 50 mg/dL   Triglycerides 59 <102 mg/dL   LDL Cholesterol (Calc) 103 (H) mg/dL (calc)   Total CHOL/HDL Ratio 2.9 <5.0 (calc)   Non-HDL Cholesterol (Calc) 117 <130 mg/dL (calc)  TSH   Collection Time: 02/11/24  9:06 AM  Result Value Ref Range   TSH 2.08 mIU/L

## 2024-02-08 ENCOUNTER — Ambulatory Visit: Admitting: Family Medicine

## 2024-02-08 ENCOUNTER — Other Ambulatory Visit: Payer: Self-pay | Admitting: Nurse Practitioner

## 2024-02-08 DIAGNOSIS — Z7901 Long term (current) use of anticoagulants: Secondary | ICD-10-CM

## 2024-02-08 DIAGNOSIS — Z5181 Encounter for therapeutic drug level monitoring: Secondary | ICD-10-CM

## 2024-02-08 DIAGNOSIS — I359 Nonrheumatic aortic valve disorder, unspecified: Secondary | ICD-10-CM

## 2024-02-08 DIAGNOSIS — E559 Vitamin D deficiency, unspecified: Secondary | ICD-10-CM

## 2024-02-08 DIAGNOSIS — B181 Chronic viral hepatitis B without delta-agent: Secondary | ICD-10-CM

## 2024-02-08 DIAGNOSIS — K760 Fatty (change of) liver, not elsewhere classified: Secondary | ICD-10-CM | POA: Diagnosis not present

## 2024-02-08 DIAGNOSIS — Z131 Encounter for screening for diabetes mellitus: Secondary | ICD-10-CM

## 2024-02-08 DIAGNOSIS — E890 Postprocedural hypothyroidism: Secondary | ICD-10-CM

## 2024-02-08 DIAGNOSIS — Z13220 Encounter for screening for lipoid disorders: Secondary | ICD-10-CM

## 2024-02-08 DIAGNOSIS — B1910 Unspecified viral hepatitis B without hepatic coma: Secondary | ICD-10-CM

## 2024-02-08 DIAGNOSIS — D509 Iron deficiency anemia, unspecified: Secondary | ICD-10-CM

## 2024-02-10 ENCOUNTER — Ambulatory Visit
Admission: RE | Admit: 2024-02-10 | Discharge: 2024-02-10 | Disposition: A | Discharge status: Home / Self Care | Source: Ambulatory Visit | Attending: Nurse Practitioner | Admitting: Nurse Practitioner

## 2024-02-10 DIAGNOSIS — K760 Fatty (change of) liver, not elsewhere classified: Secondary | ICD-10-CM

## 2024-02-10 DIAGNOSIS — B181 Chronic viral hepatitis B without delta-agent: Secondary | ICD-10-CM

## 2024-02-11 ENCOUNTER — Ambulatory Visit (INDEPENDENT_AMBULATORY_CARE_PROVIDER_SITE_OTHER): Admitting: Family Medicine

## 2024-02-11 VITALS — BP 122/62 | HR 64 | Temp 98.4°F | Resp 16 | Ht 64.0 in | Wt 124.0 lb

## 2024-02-11 DIAGNOSIS — E039 Hypothyroidism, unspecified: Secondary | ICD-10-CM

## 2024-02-11 DIAGNOSIS — Z130 Encounter for screening for diseases of the blood and blood-forming organs and certain disorders involving the immune mechanism: Secondary | ICD-10-CM

## 2024-02-11 DIAGNOSIS — R051 Acute cough: Secondary | ICD-10-CM

## 2024-02-11 DIAGNOSIS — Z13220 Encounter for screening for lipoid disorders: Secondary | ICD-10-CM | POA: Diagnosis not present

## 2024-02-11 DIAGNOSIS — Z131 Encounter for screening for diabetes mellitus: Secondary | ICD-10-CM

## 2024-02-11 MED ORDER — BENZONATATE 100 MG PO CAPS: 100.0000 mg | ORAL_CAPSULE | Freq: Three times a day (TID) | ORAL | 1 refills | Status: DC | PRN

## 2024-02-11 NOTE — Patient Instructions (Signed)
 Great to see you today- I will be in touch with your labs Please see me in about 6 months assuming all is well  Please call and set up your mammogram We will call [facility] and update [facility] vaccines for you

## 2024-02-12 LAB — HEMOGLOBIN A1C
Hgb A1c MFr Bld: 5.7 % — ABNORMAL HIGH (ref <5.7)
Mean Plasma Glucose: 117 mg/dL
eAG (mmol/L): 6.5 mmol/L

## 2024-02-12 LAB — COMPREHENSIVE METABOLIC PANEL WITH GFR
AG Ratio: 1.3 (calc) (ref 1.0–2.5)
ALT: 17 U/L (ref 6–29)
AST: 21 U/L (ref 10–35)
Albumin: 4.2 g/dL (ref 3.6–5.1)
Alkaline phosphatase (APISO): 53 U/L (ref 37–153)
BUN: 14 mg/dL (ref 7–25)
CO2: 27 mmol/L (ref 20–32)
Calcium: 9.5 mg/dL (ref 8.6–10.4)
Chloride: 106 mmol/L (ref 98–110)
Creat: 0.64 mg/dL (ref 0.50–1.03)
Globulin: 3.2 g/dL (ref 1.9–3.7)
Glucose, Bld: 89 mg/dL (ref 65–99)
Potassium: 4.1 mmol/L (ref 3.5–5.3)
Sodium: 139 mmol/L (ref 135–146)
Total Bilirubin: 0.7 mg/dL (ref 0.2–1.2)
Total Protein: 7.4 g/dL (ref 6.1–8.1)
eGFR: 107 mL/min/{1.73_m2} (ref >=60)

## 2024-02-12 LAB — CBC
HCT: 41.3 % (ref 35.0–45.0)
Hemoglobin: 13.1 g/dL (ref 11.7–15.5)
MCH: 28.9 pg (ref 27.0–33.0)
MCHC: 31.7 g/dL — ABNORMAL LOW (ref 32.0–36.0)
MCV: 91 fL (ref 80.0–100.0)
MPV: 10.9 fL (ref 7.5–12.5)
Platelets: 231 10*3/uL (ref 140–400)
RBC: 4.54 10*6/uL (ref 3.80–5.10)
RDW: 13.2 % (ref 11.0–15.0)
WBC: 5 10*3/uL (ref 3.8–10.8)

## 2024-02-12 LAB — LIPID PANEL
Cholesterol: 180 mg/dL (ref <200)
HDL: 63 mg/dL (ref >=50)
LDL Cholesterol (Calc): 103 mg/dL — ABNORMAL HIGH
Non-HDL Cholesterol (Calc): 117 mg/dL (ref <130)
Total CHOL/HDL Ratio: 2.9 (calc) (ref <5.0)
Triglycerides: 59 mg/dL (ref <150)

## 2024-02-12 LAB — TSH: TSH: 2.08 m[IU]/L

## 2024-02-19 ENCOUNTER — Other Ambulatory Visit: Payer: Self-pay | Admitting: Family Medicine

## 2024-02-19 ENCOUNTER — Other Ambulatory Visit: Payer: Self-pay | Admitting: Cardiology

## 2024-02-19 DIAGNOSIS — Z952 Presence of prosthetic heart valve: Secondary | ICD-10-CM

## 2024-02-19 DIAGNOSIS — E039 Hypothyroidism, unspecified: Secondary | ICD-10-CM

## 2024-02-29 ENCOUNTER — Ambulatory Visit: Attending: Cardiology

## 2024-02-29 DIAGNOSIS — Z7901 Long term (current) use of anticoagulants: Secondary | ICD-10-CM | POA: Diagnosis not present

## 2024-02-29 DIAGNOSIS — Z952 Presence of prosthetic heart valve: Secondary | ICD-10-CM | POA: Diagnosis not present

## 2024-02-29 DIAGNOSIS — I359 Nonrheumatic aortic valve disorder, unspecified: Secondary | ICD-10-CM

## 2024-02-29 LAB — POCT INR: INR: 1.9 — AB (ref 2.0–3.0)

## 2024-02-29 NOTE — Progress Notes (Signed)
Please see anticoagulation encounter.

## 2024-02-29 NOTE — Patient Instructions (Signed)
 continue taking 1 tablet daily except 1/2 tablet Monday, Wednesday, and Friday.  Repeat INR in 6 weeks.  Coumadin  Coumadin  [phone]

## 2024-03-02 ENCOUNTER — Ambulatory Visit: Payer: 59 | Admitting: Dermatology

## 2024-03-03 ENCOUNTER — Ambulatory Visit: Payer: 59 | Admitting: Dermatology

## 2024-03-07 DIAGNOSIS — Z1231 Encounter for screening mammogram for malignant neoplasm of breast: Secondary | ICD-10-CM | POA: Diagnosis not present

## 2024-03-07 LAB — HM MAMMOGRAPHY

## 2024-03-08 ENCOUNTER — Telehealth: Payer: Self-pay | Admitting: Orthopaedic Surgery

## 2024-03-08 NOTE — Telephone Encounter (Signed)
 Pt's husband Franky called requesting a call back concerning his wife. Asking for a sooner appt to go over MRI. Pt's husband is only requesting a dr and not a PA. Please call pt's husband with sooner appt at [phone]

## 2024-03-09 ENCOUNTER — Ambulatory Visit (HOSPITAL_BASED_OUTPATIENT_CLINIC_OR_DEPARTMENT_OTHER): Admitting: Student

## 2024-04-07 ENCOUNTER — Other Ambulatory Visit (HOSPITAL_BASED_OUTPATIENT_CLINIC_OR_DEPARTMENT_OTHER): Payer: Self-pay

## 2024-04-07 ENCOUNTER — Ambulatory Visit (HOSPITAL_BASED_OUTPATIENT_CLINIC_OR_DEPARTMENT_OTHER): Admitting: Orthopaedic Surgery

## 2024-04-07 DIAGNOSIS — M25511 Pain in right shoulder: Secondary | ICD-10-CM | POA: Diagnosis not present

## 2024-04-07 DIAGNOSIS — M7501 Adhesive capsulitis of right shoulder: Secondary | ICD-10-CM | POA: Diagnosis not present

## 2024-04-07 MED ORDER — TRIAMCINOLONE ACETONIDE 40 MG/ML IJ SUSP
80.0000 mg | INTRAMUSCULAR | Status: AC | PRN
  Administered 2024-04-07: 80 mg via INTRA_ARTICULAR

## 2024-04-07 MED ORDER — LIDOCAINE HCL 1 % IJ SOLN
4.0000 mL | INTRAMUSCULAR | Status: AC | PRN
  Administered 2024-04-07: 4 mL

## 2024-04-07 NOTE — Progress Notes (Signed)
 Chief Complaint: Right shoulder pain     History of Present Illness:   04/07/2024: Presents today for follow-up of the right shoulder.  She is here today for further discussion.  [name] is a 53 y.o. female right dominant female presents with ongoing right shoulder pain over the last several months that has been atraumatic in nature.  She is having pain when she reaches for objects.  She is experiencing this deep in the joint.  This has been waking her at night.  She has been seen by [facility] who did perform an injection 6 weeks prior    Surgical History:   none  PMH/PSH/Family History/Social History/Meds/Allergies:    Past Medical History:  Diagnosis Date  . Femur fracture, left (HCC) 1990   with open reduction and internal fixation   . H/O: rheumatic fever   . Hepatitis B carrier (HCC)    positive hepatitis B carrier; maintained on Baraclude ; followed by Hepatitis clinic.  SABRA History of allergic rhinitis   . Severe aortic insufficiency 09/01/2004   s/p aortic valve replacement.     Past Surgical History:  Procedure Laterality Date  . AORTIC VALVE REPLACEMENT  4/06   21mm On-X mechanical valve  . CARDIAC CATHETERIZATION  11/15/2004   Ejection fraction is estimated 55%  . HEMORRHOID SURGERY     Social History   Socioeconomic History  . Marital status: Married    Spouse name: Not on file  . Number of children: 3  . Years of education: Not on file  . Highest education level: Not on file  Occupational History  . Occupation: Architect: [employer]  Tobacco Use  . Smoking status: Never  . Smokeless tobacco: Never  Vaping Use  . Vaping status: Never Used  Substance and Sexual Activity  . Alcohol use: No    Alcohol/week: 0.0 standard drinks of alcohol  . Drug use: No  . Sexual activity: Yes    Partners: Male  Other Topics Concern  . Not on file  Social History Narrative   Marital status: married x 18  years; happily married; no abuse.  From [place]; [place]  since [place].      Children: 3 boys (17, 96, 74)      Lives: with husband, 3 sons      Employment:  Agricultural engineer at [employer].      Tobacco: none      Alcohol: none      Drugs: none      Exercise: walking x 3-4 days; 30 minutes   Social Drivers of Corporate investment banker Strain: Not on file  Food Insecurity: Low Risk  (08/10/2023)   Received from [facility]   Hunger Vital Sign   . Within the past 12 months, you worried that your food would run out before you got money to buy more: Never true   . Within the past 12 months, the food you bought just didn't last and you didn't have money to get more. : Never true  Transportation Needs: No Transportation Needs (08/10/2023)   Received from [facility]   . In the past 12 months, has lack of reliable transportation kept you from medical appointments, meetings, work or from getting things needed for daily living? : No  Physical Activity: Not on file  Stress: Not on file  Social Connections: Not on file   Family History  Problem Relation Age of Onset  . Heart disease Brother        valvular dysfunction; s/p replacement  . Hypertension Mother   . Hyperlipidemia Mother   . Hypothyroidism Mother   . Heart disease Father   . Hypertension Father   . Hyperlipidemia Father   . Stomach cancer Maternal Grandmother   . Colon cancer Neg Hx   . Rectal cancer Neg Hx   . Colon polyps Neg Hx    No Known Allergies Current Outpatient Medications  Medication Sig Dispense Refill  . albuterol  (VENTOLIN  HFA) 108 (90 Base) MCG/ACT inhaler Inhale 2 puffs into the lungs every 6 (six) hours as needed for wheezing or shortness of breath. 9 each 0  . benzonatate  (TESSALON  PERLES) 100 MG capsule Take 1 capsule (100 mg total) by mouth 3 (three) times daily as needed for cough. 90 capsule 1  . HYDROcodone  bit-homatropine (HYCODAN) 5-1.5 MG/5ML syrup Take 5 mLs by mouth every 6 (six) hours  as needed for cough. 120 mL 0  . levothyroxine  (SYNTHROID ) 75 MCG tablet Take 1 tablet (75 mcg total) by mouth daily before breakfast. 90 tablet 1  . triamcinolone  ointment (KENALOG ) 0.5 % APPLY TO DRY SKIN ON HANDS ON HANDS ONCE DAILY AS NEEDED 60 g 0  . warfarin (COUMADIN ) 5 MG tablet TAKE 1/2 TO 1 TABLET ONCE DAILY AS DIRECTED BY COUMADIN  CLINIC**90 DAY SUPPLY** 75 tablet 0   No current facility-administered medications for this visit.   No results found.  Review of Systems:   A ROS was performed including pertinent positives and negatives as documented in the HPI.  Physical Exam :   Constitutional: NAD and appears stated age Neurological: Alert and oriented Psych: Appropriate affect and cooperative There were no vitals taken for this visit.   Comprehensive Musculoskeletal Exam:    Right hip with tenderness about the glenohumeral joint.  Pain with passive external rotation which is 60 compared to 65 on the contralateral side.  Forward elevation is 270 bilaterally with pain on the right.  Remainder of distal neurosensory exam is intact  Left calf with tenderness to palpation about the gastrocnemius medially and laterally.  No obvious neurological or strength deficits on the side.  Imaging:   Xray (3 views right shoulder): Normal  MRI right shoulder: Thickening of the inferior glenohumeral ligament consistent with capsulitis  I personally reviewed and interpreted the radiographs.   Assessment:   53 y.o. female with evidence of right shoulder adhesive capsulitis in the setting of thyroid  disease as well.  I did recommend 1 additional right shoulder ultrasound-guided injection.  If this does not get her complete relief we did discuss the possibility of a capsular release and debridement.  Will plan to begin with this  Plan :    -Right shoulder ultrasound-guided injection provided after verbal consent obtained    Procedure Note  Patient: [name]             Date of  Birth: 11-15-1970           MRN: [id number]             Visit Date: 04/07/2024  Procedures: Visit Diagnoses:  1. Adhesive capsulitis of right shoulder     Large Joint Inj: R glenohumeral on 04/07/2024 12:40 PM Indications: pain Details: 22 G 1.5 in needle, ultrasound-guided anterior approach  Arthrogram: No  Medications: 4 mL lidocaine  1 %; 80 mg triamcinolone  acetonide 40 MG/ML Outcome: tolerated well, no immediate complications Procedure, treatment alternatives, risks and benefits explained, specific risks discussed. Consent was given by the patient. Immediately prior to procedure a time out was called to verify the correct patient, procedure, equipment, support staff and site/side marked as required. Patient was prepped and draped in the usual sterile fashion.         I personally saw and evaluated the patient, and participated in the management and treatment plan.  Elspeth Parker, MD Attending Physician, Orthopedic Surgery  This document was dictated using Dragon voice recognition software. A reasonable attempt at proof reading has been made to minimize errors.

## 2024-04-11 ENCOUNTER — Ambulatory Visit

## 2024-04-18 ENCOUNTER — Ambulatory Visit

## 2024-04-18 ENCOUNTER — Ambulatory Visit: Attending: Cardiology

## 2024-04-18 DIAGNOSIS — Z952 Presence of prosthetic heart valve: Secondary | ICD-10-CM | POA: Diagnosis not present

## 2024-04-18 DIAGNOSIS — Z7901 Long term (current) use of anticoagulants: Secondary | ICD-10-CM | POA: Diagnosis not present

## 2024-04-18 DIAGNOSIS — I359 Nonrheumatic aortic valve disorder, unspecified: Secondary | ICD-10-CM | POA: Diagnosis not present

## 2024-04-18 LAB — POCT INR: INR: 1.9 — AB (ref 2.0–3.0)

## 2024-04-18 NOTE — Patient Instructions (Signed)
 continue taking 1 tablet daily except 1/2 tablet Monday, Wednesday, and Friday.  Repeat INR in 6 weeks.  Coumadin  Coumadin  [phone]

## 2024-04-18 NOTE — Progress Notes (Signed)
 INR 1.9; Please see anticoagulation encounter

## 2024-05-16 DIAGNOSIS — Z124 Encounter for screening for malignant neoplasm of cervix: Secondary | ICD-10-CM | POA: Diagnosis not present

## 2024-05-18 LAB — HM PAP SMEAR

## 2024-05-23 ENCOUNTER — Telehealth: Payer: Self-pay | Admitting: Cardiology

## 2024-05-23 MED ORDER — AMOXICILLIN 500 MG PO CAPS: 2000.0000 mg | ORAL_CAPSULE | ORAL | 6 refills | Status: DC | PRN

## 2024-05-23 NOTE — Telephone Encounter (Signed)
 Pt called in and stated she is having a cleaning tomorrow and needs to know if she needs to take an antibiotic before cleaning?   Best number [phone]

## 2024-05-23 NOTE — Telephone Encounter (Signed)
 Spoke with the patient and advised that she will need to take antibiotics prior to her cleanings. She has previously been prescribed amoxicillin  2000 mg to take 1 hour prior. Will send in refill.

## 2024-05-24 NOTE — Telephone Encounter (Signed)
 Spoke with pt and told her she should have 6 refills on that medication. Pt stated understanding.

## 2024-05-24 NOTE — Telephone Encounter (Signed)
 Pt called in stating she had cleaning but they scheduled her another appt for cavity fix 06/07/24. She asked if she can have another antibiotic. Please advise.

## 2024-05-30 ENCOUNTER — Ambulatory Visit

## 2024-06-06 ENCOUNTER — Ambulatory Visit: Attending: Cardiology | Admitting: Pharmacist

## 2024-06-06 DIAGNOSIS — Z7901 Long term (current) use of anticoagulants: Secondary | ICD-10-CM | POA: Diagnosis not present

## 2024-06-06 DIAGNOSIS — I359 Nonrheumatic aortic valve disorder, unspecified: Secondary | ICD-10-CM | POA: Diagnosis not present

## 2024-06-06 DIAGNOSIS — Z952 Presence of prosthetic heart valve: Secondary | ICD-10-CM | POA: Diagnosis not present

## 2024-06-06 LAB — POCT INR: INR: 2.1 (ref 2.0–3.0)

## 2024-06-06 NOTE — Progress Notes (Signed)
 Description   INR 2.1:  continue taking 1 tablet daily except 1/2 tablet Monday, Wednesday, and Friday.  Repeat INR in 6 weeks.  Coumadin  Coumadin  [phone]

## 2024-06-06 NOTE — Patient Instructions (Signed)
 Description   INR 2.1:  continue taking 1 tablet daily except 1/2 tablet Monday, Wednesday, and Friday.  Repeat INR in 6 weeks.  Coumadin  Coumadin  [phone]

## 2024-07-04 ENCOUNTER — Encounter: Payer: Self-pay | Admitting: Radiology

## 2024-07-04 DIAGNOSIS — H2513 Age-related nuclear cataract, bilateral: Secondary | ICD-10-CM | POA: Diagnosis not present

## 2024-07-04 LAB — HM DIABETES EYE EXAM

## 2024-07-08 ENCOUNTER — Other Ambulatory Visit: Payer: Self-pay | Admitting: *Deleted

## 2024-07-08 DIAGNOSIS — Z952 Presence of prosthetic heart valve: Secondary | ICD-10-CM

## 2024-07-08 MED ORDER — WARFARIN SODIUM 5 MG PO TABS: ORAL_TABLET | ORAL | 0 refills | Status: DC

## 2024-07-08 NOTE — Telephone Encounter (Signed)
 Warfarin 5mg  refill S/P AVR , Afib Last INR 06/06/24 Last OV 07/06/23-needs appt, PLACED NOTE ON NEXT ANTICOAG APPT

## 2024-07-18 ENCOUNTER — Ambulatory Visit: Attending: Cardiology

## 2024-07-18 DIAGNOSIS — Z7901 Long term (current) use of anticoagulants: Secondary | ICD-10-CM

## 2024-07-18 DIAGNOSIS — I359 Nonrheumatic aortic valve disorder, unspecified: Secondary | ICD-10-CM | POA: Diagnosis not present

## 2024-07-18 DIAGNOSIS — Z952 Presence of prosthetic heart valve: Secondary | ICD-10-CM | POA: Diagnosis not present

## 2024-07-18 LAB — POCT INR: INR: 1.9 — AB (ref 2.0–3.0)

## 2024-07-18 NOTE — Patient Instructions (Signed)
 continue taking 1 tablet daily except 1/2 tablet Monday, Wednesday, and Friday.  Repeat INR in 6 weeks.  Coumadin  Coumadin  [phone]

## 2024-07-18 NOTE — Progress Notes (Signed)
 INR 1.9 Please see anticoagulation encounter continue taking 1 tablet daily except 1/2 tablet Monday, Wednesday, and Friday.  Repeat INR in 6 weeks.  Coumadin  Coumadin  [phone]

## 2024-08-07 NOTE — Progress Notes (Unsigned)
 [facility] [address] [place], [place] [address] [phone]  Date:  08/08/2024   Name:  [name]   DOB:  1971/08/10   MRN:  [id number]  PCP:  Watt Harlene BROCKS, MD    Chief Complaint: No chief complaint on file.   History of Present Illness:  [name] is a 53 y.o. very pleasant female patient who presents with the following:  Patient seen today for periodic follow-up and lab work I saw her most recently in June History of post radiation hypothyroidism, hepatitis B, aortic valve disorder due to rheumatic fever with valve replacement on Coumadin , vitamin D  deficiency, borderline prediabetes   Her anticoagulation is managed by Coumadin  clinic She sees Dr. Claudene with [facility] hematology for iron deficiency  Hepatitis B is followed by hepatology clinic also at [facility] recent visit in June: Patient has chronic hepatitis B, HBeAg positive.  Patient had been on entecavir  for many years. She had a change in her health insurance and entecavir  was no longer preferred formulary and was costing her $50 per month which was cost prohibitive. She was transitioned to [facility] in March 2021 which was the preferred hepatitis B drug by her insurance company. Most recent laboratory testing in September 2022 with ALT 13 and undetectable HBV DNA. She had a positive hepatitis B surface antigen in June 2024. Patient had FibroScan in April 2023 with no evidence of advanced fibrosis. Fibroscan did show mild hepatic steatosis.   Most recent labs done in June A1c 5.7, thyroid  in range  Flu vaccine Mammogram Colonoscopy is up to date Pap smear last year  Levothyroxine  75 Coumadin  Vemlidy for hep B per [facility]  Discussed the use of AI scribe software for clinical note transcription with the patient, who gave verbal consent to proceed.  History of Present Illness     Patient Active Problem List   Diagnosis  Date Noted   Hypothyroidism, postradioiodine therapy 02/04/2017   Vasovagal syncope 05/02/2015   Hepatitis B 04/28/2012   Aortic valve disorder 02/11/2012   Long term (current) use of anticoagulants 02/11/2012   S/P AVR 12/16/2011   Chronic anticoagulation 12/16/2011   PULMONARY NODULE 01/31/2010   DYSPNEA 01/31/2010    Past Medical History:  Diagnosis Date   Femur fracture, left (HCC) 1990   with open reduction and internal fixation    H/O: rheumatic fever    Hepatitis B carrier (HCC)    positive hepatitis B carrier; maintained on Baraclude ; followed by Hepatitis clinic.   History of allergic rhinitis    Severe aortic insufficiency 09/01/2004   s/p aortic valve replacement.      Past Surgical History:  Procedure Laterality Date   AORTIC VALVE REPLACEMENT  4/06   21mm On-X mechanical valve   CARDIAC CATHETERIZATION  11/15/2004   Ejection fraction is estimated 55%   HEMORRHOID SURGERY      Social History   Tobacco Use   Smoking status: Never   Smokeless tobacco: Never  Vaping Use   Vaping status: Never Used  Substance Use Topics   Alcohol use: No    Alcohol/week: 0.0 standard drinks of alcohol   Drug use: No    Family History  Problem Relation Age of Onset   Heart disease Brother        valvular dysfunction; s/p replacement   Hypertension Mother    Hyperlipidemia Mother    Hypothyroidism Mother  Heart disease Father    Hypertension Father    Hyperlipidemia Father    Stomach cancer Maternal Grandmother    Colon cancer Neg Hx    Rectal cancer Neg Hx    Colon polyps Neg Hx     No Known Allergies  Medication list has been reviewed and updated.  Current Outpatient Medications on File Prior to Visit  Medication Sig Dispense Refill   albuterol  (VENTOLIN  HFA) 108 (90 Base) MCG/ACT inhaler Inhale 2 puffs into the lungs every 6 (six) hours as needed for wheezing or shortness of breath. 9 each 0   amoxicillin  (AMOXIL ) 500 MG capsule Take 4 capsules (2,000 mg  total) by mouth as needed. Take 1 hour prior to dental appointments 4 capsule 6   benzonatate  (TESSALON  PERLES) 100 MG capsule Take 1 capsule (100 mg total) by mouth 3 (three) times daily as needed for cough. 90 capsule 1   HYDROcodone  bit-homatropine (HYCODAN) 5-1.5 MG/5ML syrup Take 5 mLs by mouth every 6 (six) hours as needed for cough. 120 mL 0   levothyroxine  (SYNTHROID ) 75 MCG tablet Take 1 tablet (75 mcg total) by mouth daily before breakfast. 90 tablet 1   triamcinolone  ointment (KENALOG ) 0.5 % APPLY TO DRY SKIN ON HANDS ON HANDS ONCE DAILY AS NEEDED 60 g 0   warfarin (COUMADIN ) 5 MG tablet TAKE 1/2 TO 1 TABLET ONCE DAILY AS DIRECTED BY COUMADIN  CLINIC. PLEASE SCHEDULE CARDIOLOGIST APPOINTMENT 75 tablet 0   No current facility-administered medications on file prior to visit.    Review of Systems:  As per HPI- otherwise negative.   Physical Examination: There were no vitals filed for this visit. There were no vitals filed for this visit. There is no height or weight on file to calculate BMI. Ideal Body Weight:    GEN: no acute distress. HEENT: Atraumatic, Normocephalic.  Ears and Nose: No external deformity. CV: RRR, No M/G/R. No JVD. No thrill. No extra heart sounds. PULM: CTA B, no wheezes, crackles, rhonchi. No retractions. No resp. distress. No accessory muscle use. ABD: S, NT, ND, +BS. No rebound. No HSM. EXTR: No c/c/e PSYCH: Normally interactive. Conversant.    Assessment and Plan: No diagnosis found.  Assessment & Plan   Signed Harlene Schroeder, MD

## 2024-08-08 ENCOUNTER — Encounter: Payer: Self-pay | Admitting: Family Medicine

## 2024-08-08 ENCOUNTER — Ambulatory Visit: Admitting: Family Medicine

## 2024-08-08 VITALS — BP 126/72 | HR 73 | Temp 98.5°F | Ht 64.0 in | Wt 129.6 lb

## 2024-08-08 DIAGNOSIS — E039 Hypothyroidism, unspecified: Secondary | ICD-10-CM | POA: Diagnosis not present

## 2024-08-08 DIAGNOSIS — B1910 Unspecified viral hepatitis B without hepatic coma: Secondary | ICD-10-CM

## 2024-08-08 DIAGNOSIS — R051 Acute cough: Secondary | ICD-10-CM

## 2024-08-08 DIAGNOSIS — Z7901 Long term (current) use of anticoagulants: Secondary | ICD-10-CM

## 2024-08-08 DIAGNOSIS — J019 Acute sinusitis, unspecified: Secondary | ICD-10-CM | POA: Diagnosis not present

## 2024-08-08 LAB — TSH: TSH: 2.06 u[IU]/mL (ref 0.35–5.50)

## 2024-08-08 MED ORDER — BENZONATATE 100 MG PO CAPS: 100.0000 mg | ORAL_CAPSULE | Freq: Three times a day (TID) | ORAL | 1 refills | Status: AC | PRN

## 2024-08-08 MED ORDER — HYDROCODONE BIT-HOMATROP MBR 5-1.5 MG/5ML PO SOLN: 5.0000 mL | Freq: Four times a day (QID) | ORAL | 0 refills | Status: AC | PRN

## 2024-08-08 MED ORDER — AMOXICILLIN 875 MG PO TABS: 875.0000 mg | ORAL_TABLET | Freq: Two times a day (BID) | ORAL | 0 refills | Status: DC

## 2024-08-08 NOTE — Patient Instructions (Signed)
 Good to see you today!  I will be in touch with your thyroid  level Amoxicillin  for your sinus infection and cough meds as needed Please let me know if you are not feeling better soon!

## 2024-08-10 ENCOUNTER — Other Ambulatory Visit: Payer: Self-pay | Admitting: Nurse Practitioner

## 2024-08-10 DIAGNOSIS — K760 Fatty (change of) liver, not elsewhere classified: Secondary | ICD-10-CM

## 2024-08-10 DIAGNOSIS — B181 Chronic viral hepatitis B without delta-agent: Secondary | ICD-10-CM

## 2024-08-26 ENCOUNTER — Ambulatory Visit
Admission: RE | Admit: 2024-08-26 | Discharge: 2024-08-26 | Disposition: A | Discharge status: Home / Self Care | Source: Ambulatory Visit | Attending: Nurse Practitioner | Admitting: Nurse Practitioner

## 2024-08-26 DIAGNOSIS — K760 Fatty (change of) liver, not elsewhere classified: Secondary | ICD-10-CM

## 2024-08-26 DIAGNOSIS — B181 Chronic viral hepatitis B without delta-agent: Secondary | ICD-10-CM

## 2024-08-29 ENCOUNTER — Ambulatory Visit

## 2024-08-29 ENCOUNTER — Ambulatory Visit: Attending: Cardiology | Admitting: *Deleted

## 2024-08-29 DIAGNOSIS — Z7901 Long term (current) use of anticoagulants: Secondary | ICD-10-CM

## 2024-08-29 DIAGNOSIS — I359 Nonrheumatic aortic valve disorder, unspecified: Secondary | ICD-10-CM

## 2024-08-29 DIAGNOSIS — Z5181 Encounter for therapeutic drug level monitoring: Secondary | ICD-10-CM

## 2024-08-29 LAB — POCT INR: POC INR: 1.3

## 2024-08-29 NOTE — Patient Instructions (Signed)
 Description   INR 1.3; Take 1 tablet of warfarin today and then continue taking 1 tablet daily except 1/2 tablet Monday, Wednesday, and Friday.  Repeat INR in 3 weeks.  Coumadin  Coumadin  [phone]

## 2024-08-29 NOTE — Progress Notes (Signed)
 Lab Results  Component Value Date   INR 1.3 08/29/2024   INR 1.9 (A) 07/18/2024   INR 2.1 06/06/2024    Description   INR 1.3; Take 1 tablet of warfarin today and then continue taking 1 tablet daily except 1/2 tablet Monday, Wednesday, and Friday.  Repeat INR in 3 weeks.  Coumadin  Coumadin  [phone]

## 2024-09-15 NOTE — Assessment & Plan Note (Signed)
 S/p mechanical AVR in 2006. Last TTE in 2022 with normal structure and function of AVR. She is doing well w/o chest pain, shortness of breath, edema. Her cardiac exam is normal. EKG demonstrates NSR. She is tolerating Coumadin .  - Continue follow up in Coumadin  Clinic. She has an appt today. - Continue SBE prophylaxis. Amoxicillin  2000 mg prn renewed today with her pharmacy. - Follow up 1 year.

## 2024-09-15 NOTE — Progress Notes (Unsigned)
 " OFFICE NOTE:    Date:  09/16/2024  ID:  [name], DOB Jul 20, 1971, MRN [id number] PCP: Watt Harlene BROCKS, MD  [facility] Providers Cardiologist:  Peter Jordan, MD        Severe aortic insufficiency s/p mechanical AVR 11/2004 (# 21 mm On-X valve) Chronic shortness of breath TTE 02/13/2021: EF 70-75, no RWMA, GR 1 DD, normal RVSF, trivial MR, AVR normal structure and function (mean gradient 10) Hepatitis B Hyperthyroidism s/p RAI>>Hypothyroidism         Discussed the use of AI scribe software for clinical note transcription with the patient, who gave verbal consent to proceed. History of Present Illness [name] is a 54 y.o. female for follow up of AV disease.   She reports no issues with bleeding, black stools while taking Coumadin . She engages in regular physical activity, including walking and using a treadmill, and feels well during these activities. She has no issues with swelling but mentions knee pain currently.  She has not had chest pain, significant shortness of breath or syncope. She reports difficulty sleeping on airplanes and inquired about sleeping aids. She is planning to travel to [place] in ten days with her mother, who will stay for three months.  I have asked her to follow-up with primary care for sleeping aids.    ROS-See HPI    Studies Reviewed:  EKG Interpretation Date/Time:  Friday September 16 2024 08:13:35 EST Ventricular Rate:  71 PR Interval:  176 QRS Duration:  90 QT Interval:  394 QTC Calculation: 428 R Axis:   69  Text Interpretation: Sinus rhythm with marked sinus arrhythmia Nonspecific T wave abnormality No significant change since last tracing Confirmed by Lelon Hamilton [phone]) on 09/16/2024 8:35:34 AM    Results Labs Total cholesterol (02/11/2024): 180 Triglycerides (02/11/2024): 59 LDL (02/11/2024): 103 HDL (02/11/2024): 63 A1c (93/87/7974): 5.7 Hemoglobin (02/11/2024): 13.1 Creatinine (02/11/2024): 0.64 Potassium (02/11/2024):  4.1 ALT (02/11/2024): 17 TSH (02/11/2024): 2.06          Physical Exam:  VS:  BP 110/76   Pulse 72   Ht 5' 4 (1.626 m)   Wt 125 lb 12.8 oz (57.1 kg)   SpO2 99%   BMI 21.59 kg/m        Wt Readings from Last 3 Encounters:  09/16/24 125 lb 12.8 oz (57.1 kg)  08/08/24 129 lb 9.6 oz (58.8 kg)  02/11/24 124 lb (56.2 kg)    Constitutional:      Appearance: Healthy appearance. Not in distress.  Neck:     Vascular: JVD normal.  Pulmonary:     Breath sounds: Normal breath sounds. No wheezing. No rales.  Cardiovascular:     Normal rate. Regular rhythm. S2. mechanical      Murmurs: There is no murmur.  Edema:    Peripheral edema absent.  Abdominal:     Palpations: Abdomen is soft.       Assessment and Plan:    Assessment & Plan Aortic valve disorder S/P AVR Long term (current) use of anticoagulants S/p mechanical AVR in 2006. Last TTE in 2022 with normal structure and function of AVR. She is doing well w/o chest pain, shortness of breath, edema. Her cardiac exam is normal. EKG demonstrates NSR. She is tolerating Coumadin .  - Continue follow up in Coumadin  Clinic. She has an appt today. - Continue SBE prophylaxis. Amoxicillin  2000 mg prn renewed today with her pharmacy. - Follow up 1 year.  Dispo:  Return in about 1 year (around 09/16/2025) for Routine Follow Up with Dr. Jordan.  Signed, Glendia Ferrier, PA-C   "

## 2024-09-15 NOTE — Assessment & Plan Note (Addendum)
 S/p mechanical AVR in 2006. Last TTE in 2022 with normal structure and function of AVR. She is doing well w/o chest pain, shortness of breath, edema. Her cardiac exam is normal. EKG demonstrates NSR. She is tolerating Coumadin .  - Continue follow up in Coumadin  Clinic. She has an appt today. - Continue SBE prophylaxis. Amoxicillin  2000 mg prn renewed today with her pharmacy. - Follow up 1 year.

## 2024-09-16 ENCOUNTER — Encounter: Payer: Self-pay | Admitting: Physician Assistant

## 2024-09-16 ENCOUNTER — Ambulatory Visit: Admitting: Physician Assistant

## 2024-09-16 ENCOUNTER — Ambulatory Visit (INDEPENDENT_AMBULATORY_CARE_PROVIDER_SITE_OTHER): Admitting: Pharmacist

## 2024-09-16 VITALS — BP 110/76 | HR 72 | Ht 64.0 in | Wt 125.8 lb

## 2024-09-16 DIAGNOSIS — Z7901 Long term (current) use of anticoagulants: Secondary | ICD-10-CM

## 2024-09-16 DIAGNOSIS — I359 Nonrheumatic aortic valve disorder, unspecified: Secondary | ICD-10-CM

## 2024-09-16 DIAGNOSIS — Z952 Presence of prosthetic heart valve: Secondary | ICD-10-CM

## 2024-09-16 LAB — POCT INR: INR: 1.5 — AB (ref 2.0–3.0)

## 2024-09-16 MED ORDER — AMOXICILLIN 500 MG PO CAPS: 2000.0000 mg | ORAL_CAPSULE | ORAL | 6 refills | Status: AC | PRN

## 2024-09-16 NOTE — Patient Instructions (Addendum)
 Medication Instructions:  Take 4 capsules (2,000 mg total) by mouth as needed. Take 1 hour prior to dental appointments  *If you need a refill on your cardiac medications before your next appointment, please call your pharmacy*   Lab Work: No labs were ordered during today's visit.  If you have labs (blood work) drawn today and your tests are completely normal, you will receive your results only by: MyChart Message (if you have MyChart) OR A paper copy in the mail If you have any lab test that is abnormal or we need to change your treatment, we will call you to review the results.   Testing/Procedures: No procedures were ordered during today's visit.   Your next appointment:  A letter will be mailed to you as a reminder to call the office for your next follow up appointment.  1 year(s)   Provider:   Peter Jordan, MD     Follow-Up: At [facility], you and your health needs are our priority.  As part of our continuing mission to provide you with exceptional heart care, we have created designated Provider Care Teams.  These Care Teams include your primary Cardiologist (physician) and Advanced Practice Providers (APPs -  Physician Assistants and Nurse Practitioners) who all work together to provide you with the care you need, when you need it. We recommend signing up for the patient portal called MyChart.  Sign up information is provided on this After Visit Summary.  MyChart is used to connect with patients for Virtual Visits (Telemedicine).  Patients are able to view lab/test results, encounter notes, upcoming appointments, etc.  Non-urgent messages can be sent to your provider as well.   To learn more about what you can do with MyChart, go to [email or web address].

## 2024-09-16 NOTE — Patient Instructions (Signed)
 Description   INR 1.5; Continue taking 1 tablet daily except 1/2 tablet Monday, Wednesday, and Friday.  Patient going to [place] for a month, will be back 3/7 Coumadin  Coumadin  [phone]

## 2024-09-16 NOTE — Progress Notes (Signed)
 Description   INR 1.5; Continue taking 1 tablet daily except 1/2 tablet Monday, Wednesday, and Friday.  Patient going to [place] for a month, will be back 3/7 Coumadin  Coumadin  [phone]

## 2024-09-19 ENCOUNTER — Ambulatory Visit

## 2024-10-01 ENCOUNTER — Other Ambulatory Visit: Payer: Self-pay | Admitting: Cardiology

## 2024-10-01 DIAGNOSIS — Z952 Presence of prosthetic heart valve: Secondary | ICD-10-CM

## 2024-11-07 ENCOUNTER — Ambulatory Visit
